# Patient Record
Sex: Male | Born: 1960 | Race: White | Hispanic: No | Smoking: Never smoker
Health system: Southern US, Community
[De-identification: ages and names within clinical notes are randomized; demographics above are authoritative.]

## PROBLEM LIST (undated history)

## (undated) DIAGNOSIS — K219 Gastro-esophageal reflux disease without esophagitis: Secondary | ICD-10-CM

## (undated) DIAGNOSIS — E785 Hyperlipidemia, unspecified: Secondary | ICD-10-CM

## (undated) DIAGNOSIS — T7840XA Allergy, unspecified, initial encounter: Secondary | ICD-10-CM

## (undated) DIAGNOSIS — I4891 Unspecified atrial fibrillation: Secondary | ICD-10-CM

## (undated) DIAGNOSIS — G459 Transient cerebral ischemic attack, unspecified: Secondary | ICD-10-CM

## (undated) DIAGNOSIS — L570 Actinic keratosis: Secondary | ICD-10-CM

## (undated) DIAGNOSIS — K449 Diaphragmatic hernia without obstruction or gangrene: Secondary | ICD-10-CM

## (undated) DIAGNOSIS — I639 Cerebral infarction, unspecified: Secondary | ICD-10-CM

## (undated) DIAGNOSIS — R011 Cardiac murmur, unspecified: Secondary | ICD-10-CM

## (undated) HISTORY — DX: Diaphragmatic hernia without obstruction or gangrene: K44.9

## (undated) HISTORY — PX: TONSILLECTOMY: SUR1361

## (undated) HISTORY — DX: Cardiac murmur, unspecified: R01.1

## (undated) HISTORY — DX: Actinic keratosis: L57.0

## (undated) HISTORY — DX: Cerebral infarction, unspecified: I63.9

## (undated) HISTORY — PX: COLECTOMY: SHX59

## (undated) HISTORY — DX: Unspecified atrial fibrillation: I48.91

## (undated) HISTORY — PX: CHOLECYSTECTOMY: SHX55

## (undated) HISTORY — DX: Hyperlipidemia, unspecified: E78.5

## (undated) HISTORY — DX: Gastro-esophageal reflux disease without esophagitis: K21.9

## (undated) HISTORY — DX: Allergy, unspecified, initial encounter: T78.40XA

---

## 1999-10-04 ENCOUNTER — Encounter (INDEPENDENT_AMBULATORY_CARE_PROVIDER_SITE_OTHER): Payer: Self-pay

## 1999-10-04 ENCOUNTER — Other Ambulatory Visit: Admission: RE | Admit: 1999-10-04 | Discharge: 1999-10-04 | Payer: Self-pay | Admitting: Gastroenterology

## 2000-08-23 ENCOUNTER — Other Ambulatory Visit: Admission: RE | Admit: 2000-08-23 | Discharge: 2000-08-23 | Payer: Self-pay | Admitting: Gastroenterology

## 2000-08-23 ENCOUNTER — Encounter (INDEPENDENT_AMBULATORY_CARE_PROVIDER_SITE_OTHER): Payer: Self-pay | Admitting: Specialist

## 2000-12-01 ENCOUNTER — Encounter: Payer: Self-pay | Admitting: Gastroenterology

## 2000-12-01 ENCOUNTER — Ambulatory Visit (HOSPITAL_COMMUNITY): Admission: RE | Admit: 2000-12-01 | Discharge: 2000-12-01 | Payer: Self-pay | Admitting: Gastroenterology

## 2000-12-06 ENCOUNTER — Encounter: Payer: Self-pay | Admitting: Gastroenterology

## 2000-12-07 ENCOUNTER — Other Ambulatory Visit: Admission: RE | Admit: 2000-12-07 | Discharge: 2000-12-07 | Payer: Self-pay | Admitting: Gastroenterology

## 2000-12-07 ENCOUNTER — Encounter (INDEPENDENT_AMBULATORY_CARE_PROVIDER_SITE_OTHER): Payer: Self-pay | Admitting: Specialist

## 2001-03-28 HISTORY — PX: COLECTOMY: SHX59

## 2001-03-28 HISTORY — PX: TOTAL COLECTOMY: SHX852

## 2001-07-16 ENCOUNTER — Encounter: Payer: Self-pay | Admitting: Gastroenterology

## 2001-07-24 ENCOUNTER — Encounter: Payer: Self-pay | Admitting: Gastroenterology

## 2001-09-07 ENCOUNTER — Encounter: Payer: Self-pay | Admitting: Gastroenterology

## 2001-10-04 ENCOUNTER — Encounter: Payer: Self-pay | Admitting: Gastroenterology

## 2001-10-13 ENCOUNTER — Encounter: Payer: Self-pay | Admitting: Gastroenterology

## 2001-12-11 ENCOUNTER — Encounter: Payer: Self-pay | Admitting: Gastroenterology

## 2002-04-02 ENCOUNTER — Encounter: Payer: Self-pay | Admitting: Gastroenterology

## 2003-10-13 ENCOUNTER — Encounter: Payer: Self-pay | Admitting: Internal Medicine

## 2004-01-29 ENCOUNTER — Ambulatory Visit: Payer: Self-pay | Admitting: Internal Medicine

## 2004-03-11 ENCOUNTER — Encounter: Payer: Self-pay | Admitting: Internal Medicine

## 2004-03-11 ENCOUNTER — Encounter: Payer: Self-pay | Admitting: Gastroenterology

## 2004-04-03 ENCOUNTER — Encounter: Payer: Self-pay | Admitting: Gastroenterology

## 2004-07-29 ENCOUNTER — Ambulatory Visit: Payer: Self-pay | Admitting: Internal Medicine

## 2005-04-02 ENCOUNTER — Ambulatory Visit: Payer: Self-pay | Admitting: Family Medicine

## 2005-08-24 ENCOUNTER — Ambulatory Visit: Payer: Self-pay | Admitting: Internal Medicine

## 2005-10-14 ENCOUNTER — Ambulatory Visit: Payer: Self-pay | Admitting: Internal Medicine

## 2006-01-02 ENCOUNTER — Ambulatory Visit: Payer: Self-pay | Admitting: Internal Medicine

## 2006-01-09 ENCOUNTER — Ambulatory Visit: Payer: Self-pay | Admitting: Internal Medicine

## 2006-01-09 LAB — CONVERTED CEMR LAB
Folate: 9.3 ng/mL
Iron: 122 ug/dL (ref 42–165)
PSA: 0.44 ng/mL (ref 0.10–4.00)
Transferrin: 209.5 mg/dL — ABNORMAL LOW (ref >=212.0)
Vitamin B-12: 379 pg/mL (ref 211–911)

## 2006-02-13 ENCOUNTER — Ambulatory Visit: Payer: Self-pay | Admitting: Gastroenterology

## 2006-02-22 ENCOUNTER — Ambulatory Visit: Payer: Self-pay | Admitting: Internal Medicine

## 2006-04-10 ENCOUNTER — Ambulatory Visit: Payer: Self-pay | Admitting: Internal Medicine

## 2006-04-10 LAB — CONVERTED CEMR LAB
Chol/HDL Ratio, serum: 4.2
Cholesterol: 211 mg/dL (ref 0–200)
HDL: 50.7 mg/dL (ref >39.0)
LDL DIRECT: 129.8 mg/dL
Triglyceride fasting, serum: 74 mg/dL (ref 0–149)
VLDL: 15 mg/dL (ref 0–40)

## 2006-04-17 ENCOUNTER — Ambulatory Visit: Payer: Self-pay | Admitting: Internal Medicine

## 2006-04-17 LAB — CONVERTED CEMR LAB
Basophils Absolute: 0 10*3/uL (ref 0.0–0.1)
Basophils Relative: 0.5 % (ref 0.0–1.0)
Eosinophils Relative: 3.2 % (ref 0.0–5.0)
HCT: 46.7 % (ref 39.0–52.0)
Hemoglobin: 16.6 g/dL (ref 13.0–17.0)
Iron: 154 ug/dL (ref 42–165)
Lymphocytes Relative: 33.8 % (ref 12.0–46.0)
MCHC: 35.6 g/dL (ref 30.0–36.0)
MCV: 89.3 fL (ref 78.0–100.0)
Monocytes Absolute: 0.5 10*3/uL (ref 0.2–0.7)
Monocytes Relative: 8.6 % (ref 3.0–11.0)
Neutro Abs: 3.2 10*3/uL (ref 1.4–7.7)
Neutrophils Relative %: 53.9 % (ref 43.0–77.0)
Platelets: 227 10*3/uL (ref 150–400)
RBC: 5.23 M/uL (ref 4.22–5.81)
RDW: 12 % (ref 11.5–14.6)
WBC: 5.9 10*3/uL (ref 4.5–10.5)

## 2006-08-09 ENCOUNTER — Ambulatory Visit: Payer: Self-pay | Admitting: Internal Medicine

## 2006-09-07 ENCOUNTER — Ambulatory Visit: Payer: Self-pay | Admitting: Internal Medicine

## 2006-10-12 ENCOUNTER — Ambulatory Visit: Payer: Self-pay | Admitting: Internal Medicine

## 2007-01-08 ENCOUNTER — Telehealth (INDEPENDENT_AMBULATORY_CARE_PROVIDER_SITE_OTHER): Payer: Self-pay | Admitting: *Deleted

## 2007-01-15 ENCOUNTER — Ambulatory Visit: Payer: Self-pay | Admitting: Internal Medicine

## 2007-01-15 LAB — CONVERTED CEMR LAB
ALT: 40 units/L (ref 0–53)
AST: 22 units/L (ref 0–37)
Albumin: 3.9 g/dL (ref 3.5–5.2)
Alkaline Phosphatase: 46 units/L (ref 39–117)
BUN: 14 mg/dL (ref 6–23)
Basophils Absolute: 0 10*3/uL (ref 0.0–0.1)
Basophils Relative: 0.7 % (ref 0.0–1.0)
Bilirubin, Direct: 0.1 mg/dL (ref 0.0–0.3)
Blood in Urine, dipstick: NEGATIVE
CO2: 24 meq/L (ref 19–32)
Calcium: 9.4 mg/dL (ref 8.4–10.5)
Chloride: 111 meq/L (ref 96–112)
Cholesterol: 210 mg/dL (ref 0–200)
Creatinine, Ser: 1.2 mg/dL (ref 0.4–1.5)
Direct LDL: 112.3 mg/dL
Eosinophils Absolute: 0.2 10*3/uL (ref 0.0–0.6)
Eosinophils Relative: 3.1 % (ref 0.0–5.0)
GFR calc Af Amer: 83 mL/min
GFR calc non Af Amer: 69 mL/min
Glucose, Bld: 91 mg/dL (ref 70–99)
Glucose, Urine, Semiquant: NEGATIVE
HCT: 46.8 % (ref 39.0–52.0)
HDL: 41.5 mg/dL (ref >39.0)
Hemoglobin: 16.5 g/dL (ref 13.0–17.0)
Ketones, urine, test strip: NEGATIVE
Lymphocytes Relative: 28.9 % (ref 12.0–46.0)
MCHC: 35.2 g/dL (ref 30.0–36.0)
MCV: 91.9 fL (ref 78.0–100.0)
Monocytes Absolute: 0.5 10*3/uL (ref 0.2–0.7)
Monocytes Relative: 7.7 % (ref 3.0–11.0)
Neutro Abs: 4.3 10*3/uL (ref 1.4–7.7)
Neutrophils Relative %: 59.6 % (ref 43.0–77.0)
Nitrite: NEGATIVE
Platelets: 224 10*3/uL (ref 150–400)
Potassium: 4.6 meq/L (ref 3.5–5.1)
Protein, U semiquant: NEGATIVE
RBC: 5.09 M/uL (ref 4.22–5.81)
RDW: 11.8 % (ref 11.5–14.6)
Sodium: 146 meq/L — ABNORMAL HIGH (ref 135–145)
Specific Gravity, Urine: >=1.03
TSH: 1.8 microintl units/mL (ref 0.35–5.50)
Total Bilirubin: 0.6 mg/dL (ref 0.3–1.2)
Total CHOL/HDL Ratio: 5.1
Total Protein: 6.6 g/dL (ref 6.0–8.3)
Triglycerides: 336 mg/dL (ref 0–149)
Urobilinogen, UA: 0.2
VLDL: 67 mg/dL — ABNORMAL HIGH (ref 0–40)
WBC Urine, dipstick: NEGATIVE
WBC: 7 10*3/uL (ref 4.5–10.5)
pH: 5

## 2007-01-22 ENCOUNTER — Ambulatory Visit: Payer: Self-pay | Admitting: Internal Medicine

## 2007-01-22 DIAGNOSIS — J45909 Unspecified asthma, uncomplicated: Secondary | ICD-10-CM | POA: Insufficient documentation

## 2007-01-22 DIAGNOSIS — E785 Hyperlipidemia, unspecified: Secondary | ICD-10-CM | POA: Insufficient documentation

## 2007-01-22 DIAGNOSIS — K219 Gastro-esophageal reflux disease without esophagitis: Secondary | ICD-10-CM | POA: Insufficient documentation

## 2007-01-22 DIAGNOSIS — Z8719 Personal history of other diseases of the digestive system: Secondary | ICD-10-CM | POA: Insufficient documentation

## 2007-01-22 DIAGNOSIS — K5080 Crohn's disease of both small and large intestine without complications: Secondary | ICD-10-CM | POA: Insufficient documentation

## 2007-03-02 ENCOUNTER — Ambulatory Visit: Payer: Self-pay | Admitting: *Deleted

## 2007-03-07 ENCOUNTER — Ambulatory Visit: Payer: Self-pay | Admitting: *Deleted

## 2007-03-14 ENCOUNTER — Ambulatory Visit: Payer: Self-pay | Admitting: *Deleted

## 2007-03-28 ENCOUNTER — Ambulatory Visit: Payer: Self-pay | Admitting: *Deleted

## 2007-04-04 ENCOUNTER — Ambulatory Visit: Payer: Self-pay | Admitting: *Deleted

## 2007-04-13 ENCOUNTER — Ambulatory Visit: Payer: Self-pay | Admitting: *Deleted

## 2007-05-04 ENCOUNTER — Ambulatory Visit: Payer: Self-pay | Admitting: *Deleted

## 2007-05-09 ENCOUNTER — Ambulatory Visit: Payer: Self-pay | Admitting: *Deleted

## 2007-05-18 ENCOUNTER — Ambulatory Visit: Payer: Self-pay | Admitting: Internal Medicine

## 2007-05-18 ENCOUNTER — Ambulatory Visit: Payer: Self-pay | Admitting: *Deleted

## 2007-05-18 LAB — CONVERTED CEMR LAB
ALT: 34 units/L (ref 0–53)
AST: 23 units/L (ref 0–37)
Albumin: 3.7 g/dL (ref 3.5–5.2)
Alkaline Phosphatase: 42 units/L (ref 39–117)
Bilirubin, Direct: 0.3 mg/dL (ref 0.0–0.3)
Cholesterol: 161 mg/dL (ref 0–200)
HDL: 51.6 mg/dL (ref >39.0)
LDL Cholesterol: 97 mg/dL (ref 0–99)
Total Bilirubin: 1.3 mg/dL — ABNORMAL HIGH (ref 0.3–1.2)
Total CHOL/HDL Ratio: 3.1
Total Protein: 6.3 g/dL (ref 6.0–8.3)
Triglycerides: 64 mg/dL (ref 0–149)
VLDL: 13 mg/dL (ref 0–40)

## 2007-05-23 ENCOUNTER — Ambulatory Visit: Payer: Self-pay | Admitting: *Deleted

## 2007-05-25 ENCOUNTER — Ambulatory Visit: Payer: Self-pay | Admitting: Internal Medicine

## 2007-05-30 ENCOUNTER — Ambulatory Visit: Payer: Self-pay | Admitting: *Deleted

## 2007-06-06 ENCOUNTER — Ambulatory Visit: Payer: Self-pay | Admitting: *Deleted

## 2007-06-13 ENCOUNTER — Ambulatory Visit: Payer: Self-pay | Admitting: *Deleted

## 2007-06-22 ENCOUNTER — Ambulatory Visit: Payer: Self-pay | Admitting: *Deleted

## 2007-07-20 ENCOUNTER — Ambulatory Visit: Payer: Self-pay | Admitting: *Deleted

## 2007-07-27 ENCOUNTER — Ambulatory Visit: Payer: Self-pay | Admitting: *Deleted

## 2007-08-10 ENCOUNTER — Ambulatory Visit: Payer: Self-pay | Admitting: *Deleted

## 2007-08-24 ENCOUNTER — Ambulatory Visit: Payer: Self-pay | Admitting: *Deleted

## 2007-09-19 ENCOUNTER — Ambulatory Visit: Payer: Self-pay | Admitting: *Deleted

## 2007-10-04 ENCOUNTER — Telehealth: Payer: Self-pay | Admitting: *Deleted

## 2007-10-04 ENCOUNTER — Ambulatory Visit: Payer: Self-pay | Admitting: Internal Medicine

## 2007-10-04 DIAGNOSIS — L2089 Other atopic dermatitis: Secondary | ICD-10-CM | POA: Insufficient documentation

## 2007-10-18 ENCOUNTER — Telehealth: Payer: Self-pay | Admitting: Internal Medicine

## 2007-10-18 ENCOUNTER — Telehealth: Payer: Self-pay | Admitting: Gastroenterology

## 2007-10-19 ENCOUNTER — Ambulatory Visit: Payer: Self-pay | Admitting: Internal Medicine

## 2007-10-22 ENCOUNTER — Telehealth: Payer: Self-pay | Admitting: Internal Medicine

## 2007-10-31 ENCOUNTER — Telehealth: Payer: Self-pay | Admitting: Internal Medicine

## 2007-11-12 ENCOUNTER — Ambulatory Visit: Payer: Self-pay | Admitting: Gastroenterology

## 2007-11-12 DIAGNOSIS — R1013 Epigastric pain: Secondary | ICD-10-CM | POA: Insufficient documentation

## 2008-01-26 ENCOUNTER — Telehealth: Payer: Self-pay | Admitting: Family Medicine

## 2008-02-06 ENCOUNTER — Ambulatory Visit: Payer: Self-pay | Admitting: Internal Medicine

## 2008-02-06 LAB — CONVERTED CEMR LAB
ALT: 61 units/L — ABNORMAL HIGH (ref 0–53)
Alkaline Phosphatase: 48 units/L (ref 39–117)
Bilirubin, Direct: 0.1 mg/dL (ref 0.0–0.3)
Blood in Urine, dipstick: NEGATIVE
CO2: 29 meq/L (ref 19–32)
GFR calc Af Amer: 92 mL/min
Glucose, Bld: 89 mg/dL (ref 70–99)
HDL: 48.5 mg/dL (ref >39.0)
Ketones, urine, test strip: NEGATIVE
LDL Cholesterol: 128 mg/dL — ABNORMAL HIGH (ref 0–99)
Lymphocytes Relative: 36.9 % (ref 12.0–46.0)
Monocytes Relative: 8 % (ref 3.0–12.0)
Nitrite: NEGATIVE
Platelets: 256 10*3/uL (ref 150–400)
Potassium: 4 meq/L (ref 3.5–5.1)
RDW: 11.5 % (ref 11.5–14.6)
Sodium: 142 meq/L (ref 135–145)
Total Bilirubin: 1.3 mg/dL — ABNORMAL HIGH (ref 0.3–1.2)
Total CHOL/HDL Ratio: 3.9
Total Protein: 7.3 g/dL (ref 6.0–8.3)
Triglycerides: 70 mg/dL (ref 0–149)
Urobilinogen, UA: 0.2
VLDL: 14 mg/dL (ref 0–40)

## 2008-02-13 ENCOUNTER — Ambulatory Visit: Payer: Self-pay | Admitting: Internal Medicine

## 2008-02-26 ENCOUNTER — Telehealth: Payer: Self-pay | Admitting: Internal Medicine

## 2008-08-27 ENCOUNTER — Ambulatory Visit: Payer: Self-pay | Admitting: Internal Medicine

## 2008-08-27 LAB — CONVERTED CEMR LAB
Albumin: 3.8 g/dL (ref 3.5–5.2)
Alkaline Phosphatase: 42 units/L (ref 39–117)
LDL Cholesterol: 120 mg/dL — ABNORMAL HIGH (ref 0–99)
Total CHOL/HDL Ratio: 3
Triglycerides: 48 mg/dL (ref 0.0–149.0)
VLDL: 9.6 mg/dL (ref 0.0–40.0)

## 2008-09-04 ENCOUNTER — Ambulatory Visit: Payer: Self-pay | Admitting: Internal Medicine

## 2008-09-04 LAB — CONVERTED CEMR LAB
HDL goal, serum: 40 mg/dL
LDL Goal: 160 mg/dL

## 2008-09-16 ENCOUNTER — Telehealth: Payer: Self-pay | Admitting: *Deleted

## 2009-03-02 ENCOUNTER — Ambulatory Visit: Payer: Self-pay | Admitting: Internal Medicine

## 2009-03-02 LAB — CONVERTED CEMR LAB
ALT: 37 units/L (ref 0–53)
AST: 26 units/L (ref 0–37)
Albumin: 3.9 g/dL (ref 3.5–5.2)
BUN: 12 mg/dL (ref 6–23)
Basophils Absolute: 0 10*3/uL (ref 0.0–0.1)
CO2: 28 meq/L (ref 19–32)
Chloride: 107 meq/L (ref 96–112)
Glucose, Bld: 102 mg/dL — ABNORMAL HIGH (ref 70–99)
Glucose, Urine, Semiquant: NEGATIVE
HCT: 50.5 % (ref 39.0–52.0)
HDL: 51.9 mg/dL (ref >39.00)
Hemoglobin: 17.3 g/dL — ABNORMAL HIGH (ref 13.0–17.0)
Lymphs Abs: 1.9 10*3/uL (ref 0.7–4.0)
MCV: 94.1 fL (ref 78.0–100.0)
Monocytes Absolute: 0.4 10*3/uL (ref 0.1–1.0)
Monocytes Relative: 7.9 % (ref 3.0–12.0)
Neutro Abs: 3 10*3/uL (ref 1.4–7.7)
Nitrite: NEGATIVE
PSA: 0.42 ng/mL (ref 0.10–4.00)
Platelets: 165 10*3/uL (ref 150.0–400.0)
Potassium: 4.5 meq/L (ref 3.5–5.1)
RDW: 12.2 % (ref 11.5–14.6)
Sodium: 142 meq/L (ref 135–145)
Specific Gravity, Urine: 1.025
TSH: 1.12 microintl units/mL (ref 0.35–5.50)
Total Bilirubin: 1.3 mg/dL — ABNORMAL HIGH (ref 0.3–1.2)
WBC Urine, dipstick: NEGATIVE
pH: 5

## 2009-03-09 ENCOUNTER — Ambulatory Visit: Payer: Self-pay | Admitting: Internal Medicine

## 2009-04-28 ENCOUNTER — Telehealth: Payer: Self-pay | Admitting: Internal Medicine

## 2009-05-19 ENCOUNTER — Ambulatory Visit: Payer: Self-pay | Admitting: Internal Medicine

## 2009-05-19 DIAGNOSIS — R002 Palpitations: Secondary | ICD-10-CM | POA: Insufficient documentation

## 2009-05-19 DIAGNOSIS — R55 Syncope and collapse: Secondary | ICD-10-CM | POA: Insufficient documentation

## 2009-06-03 ENCOUNTER — Encounter: Payer: Self-pay | Admitting: Internal Medicine

## 2009-06-03 ENCOUNTER — Ambulatory Visit: Payer: Self-pay | Admitting: Internal Medicine

## 2009-06-03 ENCOUNTER — Ambulatory Visit (HOSPITAL_COMMUNITY): Admission: RE | Admit: 2009-06-03 | Discharge: 2009-06-03 | Payer: Self-pay | Admitting: Internal Medicine

## 2009-06-03 ENCOUNTER — Ambulatory Visit: Payer: Self-pay

## 2009-06-10 ENCOUNTER — Ambulatory Visit: Payer: Self-pay | Admitting: Internal Medicine

## 2009-06-10 DIAGNOSIS — J45991 Cough variant asthma: Secondary | ICD-10-CM | POA: Insufficient documentation

## 2009-06-10 DIAGNOSIS — R05 Cough: Secondary | ICD-10-CM | POA: Insufficient documentation

## 2009-06-10 DIAGNOSIS — R059 Cough, unspecified: Secondary | ICD-10-CM | POA: Insufficient documentation

## 2009-06-19 ENCOUNTER — Telehealth: Payer: Self-pay | Admitting: Internal Medicine

## 2009-06-24 ENCOUNTER — Encounter: Payer: Self-pay | Admitting: Internal Medicine

## 2009-07-02 ENCOUNTER — Telehealth: Payer: Self-pay | Admitting: Internal Medicine

## 2009-07-14 ENCOUNTER — Telehealth: Payer: Self-pay | Admitting: Internal Medicine

## 2009-07-14 ENCOUNTER — Ambulatory Visit: Payer: Self-pay | Admitting: Internal Medicine

## 2009-08-06 ENCOUNTER — Telehealth: Payer: Self-pay | Admitting: Internal Medicine

## 2009-08-06 ENCOUNTER — Ambulatory Visit: Payer: Self-pay | Admitting: Internal Medicine

## 2009-09-07 ENCOUNTER — Ambulatory Visit: Payer: Self-pay | Admitting: Internal Medicine

## 2009-09-07 DIAGNOSIS — L259 Unspecified contact dermatitis, unspecified cause: Secondary | ICD-10-CM | POA: Insufficient documentation

## 2009-12-22 ENCOUNTER — Encounter: Payer: Self-pay | Admitting: Internal Medicine

## 2010-04-18 ENCOUNTER — Encounter: Payer: Self-pay | Admitting: Internal Medicine

## 2010-04-25 LAB — CONVERTED CEMR LAB
AST: 20 units/L (ref 0–37)
Albumin: 3.9 g/dL (ref 3.5–5.2)
BUN: 12 mg/dL (ref 6–23)
CK-MB: 1.3 ng/mL (ref 0.3–4.0)
Calcium: 9.4 mg/dL (ref 8.4–10.5)
GFR calc non Af Amer: 75.18 mL/min (ref >60)
Glucose, Bld: 93 mg/dL (ref 70–99)
Potassium: 3.9 meq/L (ref 3.5–5.1)
Relative Index: 1.4 (ref 0.0–2.5)
Sodium: 141 meq/L (ref 135–145)
TSH: 0.52 microintl units/mL (ref 0.35–5.50)
Total Bilirubin: 0.6 mg/dL (ref 0.3–1.2)

## 2010-04-27 NOTE — Progress Notes (Signed)
Summary: requesting other abx  Phone Note Call from Patient Call back at Home Phone [phone]   Caller: Patient---live call Complaint: Cough/Sore throat Summary of Call: Still coughing. Would like to try the other abx, that was discussed at his last visit. His pharmacy is [facility] on [address]. Initial call taken by: Despina Arias,  June 19, 2009 10:50 AM  Follow-up for Phone Call        per dr Larrie Kass have doxycycoline 100 two times a day for 10 days and medrol 4 mg dose pack Follow-up by: Allyne Gee, LPN,  June 19, 9469 10:59 AM  Additional Follow-up for Phone Call Additional follow up Details #1::        Phone Call Completed Additional Follow-up by: Shelbie Hutching, RN,  June 19, 2009 11:52 AM    New/Updated Medications: DOXYCYCLINE HYCLATE 100 MG CAPS (DOXYCYCLINE HYCLATE) One two times a day for 10 days. MEDROL (PAK) 4 MG TABS (METHYLPREDNISOLONE) As directed. Prescriptions: MEDROL (PAK) 4 MG TABS (METHYLPREDNISOLONE) As directed.  #1 x 0   Entered by:   Shelbie Hutching, RN   Authorized by:   Ricard Dillon MD   Signed by:   Shelbie Hutching, RN on 06/19/2009   Method used:   Electronically to        [facility]. [phone]* (retail)       [place], [place]  [address]       Ph: [phone]       Fax: [phone]   RxID:   [id number] DOXYCYCLINE HYCLATE 100 MG CAPS (DOXYCYCLINE HYCLATE) One two times a day for 10 days.  #20 x 0   Entered by:   Shelbie Hutching, RN   Authorized by:   Ricard Dillon MD   Signed by:   Shelbie Hutching, RN on 06/19/2009   Method used:   Electronically to        [facility]. [phone]* (retail)       [address] [place], [place]  [address]       Ph: [phone]       Fax: [phone]   RxID:   [id number]

## 2010-04-27 NOTE — Assessment & Plan Note (Signed)
Summary: COUGH, CONGESTION, FEVER // RS   Vital Signs:  Patient profile:   50 year old male Height:      74 inches Weight:      228 pounds BMI:     29.38 Temp:     98.1 degrees F oral Pulse rate:   60 / minute Resp:     14 per minute BP sitting:   130 / 70  (left arm)  Vitals Entered By: Allyne Gee, LPN (June 11, 2583 2:77 AM) CC: c/o cough and chest congestion, URI symptoms   Primary Care Provider:  Benay Pillow, MD  CC:  c/o cough and chest congestion and URI symptoms.  History of Present Illness: two weeks of allergy symptoms followed by fever and congestion and now post infectious cough with congestion  URI Symptoms      This is a 50 year old man who presents with URI symptoms.  The patient reports dry cough.  The patient denies fever, low-grade fever (<100.5 degrees), fever of 100.5-103 degrees, fever of 103.1-104 degrees, fever to >104 degrees, stiff neck, dyspnea, wheezing, rash, vomiting, diarrhea, use of an antipyretic, and response to antipyretic.  The patient also reports itchy watery eyes, itchy throat, and seasonal symptoms.  The patient denies the following risk factors for Strep sinusitis: unilateral facial pain, unilateral nasal discharge, poor response to decongestant, double sickening, tooth pain, Strep exposure, tender adenopathy, and absence of cough.     Preventive Screening-Counseling & Management  Alcohol-Tobacco     Smoking Status: never     Passive Smoke Exposure: no  Problems Prior to Update: 1)  Palpitations, Occasional  (ICD-785.1) 2)  Syncope  (ICD-780.2) 3)  Abdominal Pain-epigastric  (ICD-789.06) 4)  Dermatitis, Atopic  (ICD-691.8) 5)  Family History Breast Cancer 1st Degree Relative <50  (ICD-V16.3) 6)  Family History of Cad Male 1st Degree Relative <50  (ICD-V17.3) 7)  Gastrointestinal Hemorrhage, Hx of  (ICD-V12.79) 8)  Gerd  (ICD-530.81) 9)  Crohn's Disease, Large and Small Intestines  (ICD-555.2) 10)  Asthma  (ICD-493.90) 11)   Hyperlipidemia  (ICD-272.4) 12)  Physical Examination  (ICD-V70.0)  Current Problems (verified): 1)  Palpitations, Occasional  (ICD-785.1) 2)  Syncope  (ICD-780.2) 3)  Abdominal Pain-epigastric  (ICD-789.06) 4)  Dermatitis, Atopic  (ICD-691.8) 5)  Family History Breast Cancer 1st Degree Relative <50  (ICD-V16.3) 6)  Family History of Cad Male 1st Degree Relative <50  (ICD-V17.3) 7)  Gastrointestinal Hemorrhage, Hx of  (ICD-V12.79) 8)  Gerd  (ICD-530.81) 9)  Crohn's Disease, Large and Small Intestines  (ICD-555.2) 10)  Asthma  (ICD-493.90) 11)  Hyperlipidemia  (ICD-272.4) 12)  Physical Examination  (ICD-V70.0)  Medications Prior to Update: 1)  Omeprazole 40 Mg Cpdr (Omeprazole) .... One By Mouth Daily 2)  Metronidazole 250 Mg  Tabs (Metronidazole) .Marland Kitchen.. 1 Once Daily Hold 3)  Zyrtec Allergy 10 Mg  Tabs (Cetirizine Hcl) .... Once Daily 4)  Bayer Low Strength 81 Mg  Tbec (Aspirin) .... Once Daily 5)  Pulmicort Flexhaler 180 Mcg/act  Inha (Budesonide (Inhalation)) .... Once Daily 6)  Krill Oil 1000 Mg Caps (Krill Oil) .... Two By Mouth Two Times A Day 7)  Claritin 10 Mg Tabs (Loratadine) .Marland Kitchen.. 1 Once Daily  Current Medications (verified): 1)  Omeprazole 40 Mg Cpdr (Omeprazole) .... One By Mouth Daily 2)  Metronidazole 250 Mg  Tabs (Metronidazole) .Marland Kitchen.. 1 Once Daily Hold 3)  Zyrtec Allergy 10 Mg  Tabs (Cetirizine Hcl) .... Once Daily 4)  Bayer Low Strength 81  Mg  Tbec (Aspirin) .... Once Daily 5)  Pulmicort Flexhaler 180 Mcg/act  Inha (Budesonide (Inhalation)) .... Once Daily 6)  Krill Oil 1000 Mg Caps (Krill Oil) .... Two By Mouth Two Times A Day 7)  Claritin 10 Mg Tabs (Loratadine) .Marland Kitchen.. 1 Once Daily 8)  Symbicort 160-4.5 Mcg/act Aero (Budesonide-Formoterol Fumarate) .... Two Puff Two Times A Day 9)  Benzonatate 200 Mg Caps (Benzonatate) .... One By Mouth Q 6 Hours 10)  Fexofenadine-Pseudoephedrine 60-120 Mg Xr12h-Tab (Fexofenadine-Pseudoephedrine) .... One By Mouth Two Times A Day 11)   Nasacort Aq 55 Mcg/act Aers (Triamcinolone Acetonide(Nasal)) .... Two Sprays in Nostrils Daily  Allergies (verified): No Known Drug Allergies  Past History:  Family History: Last updated: 11/27/07 father died from stomach CA Family History of CAD Male 1st degree relative <50  father ( smoker) mother Family History Breast cancer 1st degree relative <50 Family History Ovarian cancer- Mother Family History of Bladder cancer- Father  Social History: Last updated: 11/12/2007 Married Never Smoked Alcohol use-yes Drug use-no Regular exercise-no Daily Caffeine Use 3-4 cups coffee/day  Risk Factors: Exercise: no (01/22/2007)  Risk Factors: Smoking Status: never (06/10/2009) Passive Smoke Exposure: no (06/10/2009)  Past medical, surgical, family and social histories (including risk factors) reviewed, and no changes noted (except as noted below).  Past Medical History: Reviewed history from 11/12/2007 and no changes required. Hyperlipidemia Asthma GERD Ulcerative colitis Hiatal Hernia  Past Surgical History: Reviewed history from 11/12/2007 and no changes required. Colectomy with ileoanal anastsamosis 2003 Tonsillectomy Cholecystectomy  Family History: Reviewed history from November 27, 2007 and no changes required. father died from stomach CA Family History of CAD Male 1st degree relative <50  father ( smoker) mother Family History Breast cancer 1st degree relative <50 Family History Ovarian cancer- Mother Family History of Bladder cancer- Father  Social History: Reviewed history from 11/12/2007 and no changes required. Married Never Smoked Alcohol use-yes Drug use-no Regular exercise-no Daily Caffeine Use 3-4 cups coffee/day  Review of Systems  The patient denies anorexia, fever, weight loss, weight gain, vision loss, decreased hearing, hoarseness, chest pain, syncope, dyspnea on exertion, peripheral edema, prolonged cough, headaches, hemoptysis, abdominal pain,  melena, hematochezia, severe indigestion/heartburn, hematuria, incontinence, genital sores, muscle weakness, suspicious skin lesions, transient blindness, difficulty walking, depression, unusual weight change, abnormal bleeding, enlarged lymph nodes, angioedema, and breast masses.    Physical Exam  General:  Well developed, well nourished, no acute distress. Head:  normocephalic and atraumatic.   Eyes:  PERRLA, no icterus. Ears:  External ear exam shows no significant lesions or deformities.  Otoscopic examination reveals clear canals, tympanic membranes are intact bilaterally without bulging, retraction, inflammation or discharge. Hearing is grossly normal bilaterally. Nose:  mucosal erythema and mucosal edema.   Mouth:  posterior lymphoid hypertrophy and postnasal drip.   Neck:  Supple; no masses or thyromegaly. Lungs:  normal respiratory effort, R wheezes, and L wheezes.   Heart:  Regular rate and rhythm; no murmurs, rubs,  or bruits. Abdomen:  Soft, nontender and nondistended. No masses, hepatosplenomegaly or hernias noted. Normal bowel sounds. Msk:  Symmetrical with no gross deformities. Normal posture. Extremities:  No clubbing, cyanosis, edema, or deformity noted with normal full range of motion of all joints.     Impression & Recommendations:  Problem # 1:  COUGH (ICD-786.2) tessilon perles consider added medrol dose pack if symptoms do not stop  Problem # 2:  CROHN'S DISEASE, LARGE AND SMALL INTESTINES (ICD-555.2)  Problem # 3:  COUGH VARIANT ASTHMA (ICD-493.82)  wheezing after  cough His updated medication list for this problem includes:    Pulmicort Flexhaler 180 Mcg/act Inha (Budesonide (inhalation)) ..... Once daily    Symbicort 160-4.5 Mcg/act Aero (Budesonide-formoterol fumarate) .Marland Kitchen..Marland Kitchen Two puff two times a day  Current Asthma Management Plan:  Green Zone:  PULMICORT FLEXHALER 180 MCG/ACT  INHA  Complete Medication List: 1)  Omeprazole 40 Mg Cpdr (Omeprazole) ....  One by mouth daily 2)  Metronidazole 250 Mg Tabs (Metronidazole) .Marland Kitchen.. 1 once daily hold 3)  Zyrtec Allergy 10 Mg Tabs (Cetirizine hcl) .... Once daily 4)  Bayer Low Strength 81 Mg Tbec (Aspirin) .... Once daily 5)  Pulmicort Flexhaler 180 Mcg/act Inha (Budesonide (inhalation)) .... Once daily 6)  Krill Oil 1000 Mg Caps (Krill oil) .... Two by mouth two times a day 7)  Claritin 10 Mg Tabs (Loratadine) .Marland Kitchen.. 1 once daily 8)  Symbicort 160-4.5 Mcg/act Aero (Budesonide-formoterol fumarate) .... Two puff two times a day 9)  Benzonatate 200 Mg Caps (Benzonatate) .... One by mouth q 6 hours 10)  Fexofenadine-pseudoephedrine 60-120 Mg Xr12h-tab (Fexofenadine-pseudoephedrine) .... One by mouth two times a day 11)  Nasacort Aq 55 Mcg/act Aers (Triamcinolone acetonide(nasal)) .... Two sprays in nostrils daily  Asthma Management Plan    Plan based on PEF formula: [place] and [employer]yl He[employer] Zone:PULMICORT FLEXHALER 180 MCG/ACT  INHA  Yellow Zone: SYMBICORT 160-4.5 MCG/ACT AERO:  2 puffs every 12 hours  Red Zone: SYMBICORT 160-4.5 MCG/ACT AERO Start Prednisone Taper.    Patient Instructions: 1)  follow alergy protocol 2)  Please schedule a follow-up appointment in 3 months. Prescriptions: FEXOFENADINE-PSEUDOEPHEDRINE 60-120 MG XR12H-TAB (FEXOFENADINE-PSEUDOEPHEDRINE) one by mouth two times a day  #60 x 3   Entered and Authorized by:   Ricard Dillon MD   Signed by:   Ricard Dillon MD on 06/10/2009   Method used:   Electronically to        [facility]. [phone]* (retail)       [address] [place], [place]  [address]       Ph: [phone]       Fax: [phone]   RxID:   [id number] BENZONATATE 200 MG CAPS (BENZONATATE) one by mouth q 6 hours  #30 x 2   Entered and Authorized by:   Ricard Dillon MD   Signed by:   Ricard Dillon MD on 06/10/2009   Method used:   Electronically to        [facility]. [phone]* (retail)       [address] [place], [place]  [address]       Ph: [phone]       Fax: [phone]   RxID:   [id number]

## 2010-04-27 NOTE — Letter (Signed)
Summary: [facility]   Imported By: Phillis Knack 09/07/2009 11:25:27  _____________________________________________________________________  External Attachment:    Type:   Image     Comment:   External Document

## 2010-04-27 NOTE — Progress Notes (Signed)
Summary: NEW RX [facility]  Phone Note Call from Patient Call back at Home Phone [phone]   Caller: Patient Call For: Ricard Dillon MD Summary of Call: PT NEEDS NEW RXS FOR OMPERAZOLE 40 MG #90 AND GENERIC CLARITIN 10 MG #90 WITH 3 REFILLS FAX TO [facility] Initial call taken by: Glo Herring,  April 28, 2009 10:55 AM    New/Updated Medications: CLARITIN 10 MG TABS (LORATADINE) 1 once daily Prescriptions: CLARITIN 10 MG TABS (LORATADINE) 1 once daily  #90 x 3   Entered by:   Allyne Gee, LPN   Authorized by:   Ricard Dillon MD   Signed by:   Allyne Gee, LPN on 25/75/0518   Method used:   Electronically to        [facility] (mail-order)             ,          Ph: [phone]       Fax: [phone]   RxID:   [id number] OMEPRAZOLE 40 MG CPDR (OMEPRAZOLE) one by mouth daily  #90 x 3   Entered by:   Allyne Gee, LPN   Authorized by:   Ricard Dillon MD   Signed by:   Allyne Gee, LPN on 07/61/5183   Method used:   Electronically to        [facility] (mail-order)             ,          Ph: [phone]       Fax: [phone]   RxID:   [id number] CLARITIN 10 MG TABS (LORATADINE) 1 once daily  #90 x 3   Entered by:   Allyne Gee, LPN   Authorized by:   Ricard Dillon MD   Signed by:   Allyne Gee, LPN on 01/58/6825   Method used:   Electronically to        [facility] (mail-order)             ,          Ph: [phone]       Fax: [phone]   RxID:   [id number]

## 2010-04-27 NOTE — Consult Note (Signed)
Summary: [facility]  [facility]   Imported By: Laural Benes 12/29/2009 10:34:57  _____________________________________________________________________  External Attachment:    Type:   Image     Comment:   External Document

## 2010-04-27 NOTE — Progress Notes (Signed)
Summary: LLQ pain  Phone Note Call from Patient   Caller: Patient Call For: Ricard Dillon MD Summary of Call: Pt started with LLQ pain after a bowel movement 2 hours ago.  Pain is constant, no pain on palpation, no nausea, vomiting or fever.   Initial call taken by: Deanna Artis CMA,  Aug 06, 2009 11:44 AM  Follow-up for Phone Call        per dr Vallarie Fei:clear liquid diet for 24 hours and call us in am and let us know how he is,  if any chills or fever let us know immediately Follow-up by: Allyne Gee, LPN,  Aug 07, 2070 18:28 AM  Additional Follow-up for Phone Call Additional follow up Details #1::        Pt. given Dr. Arnoldo Morale recommendations. Additional Follow-up by: Deanna Artis CMA,  Aug 06, 2009 12:00 PM

## 2010-04-27 NOTE — Progress Notes (Signed)
Summary: Pt still has cough. Wants to know if chest xray needed.  Phone Note Call from Patient Call back at [facility] Phone [phone]   Caller: Patient Reason for Call: Acute Illness Summary of Call: Pt called and said that chest congestion is not clearing up. Pt has dry, unproductive cough, that will not go away. Pt wants to know if Dr. Arnoldo Morale wants to order a chest xray or other meds. Please advise pt.  Initial call taken by: Braulio Bosch,  July 14, 2009 9:32 AM  Follow-up for Phone Call        cxr per dr Arnoldo Morale Follow-up by: Allyne Gee, LPN,  July 14, 9036 9:42 AM     Appended Document: Pt still has cough. Wants to know if chest xray needed. per dr Arnoldo Morale- give 30m medrol dose pack, biaxin 400 two times a day for 14 days and tussionex--pt informed

## 2010-04-27 NOTE — Consult Note (Signed)
Summary: [facility]   Imported By: Laural Benes 02/10/2010 13:26:31  _____________________________________________________________________  External Attachment:    Type:   Image     Comment:   External Document

## 2010-04-27 NOTE — Assessment & Plan Note (Signed)
Summary: 6 MNTH ROV//SLM   Vital Signs:  Patient profile:   50 year old male Height:      74 inches Weight:      224 pounds BMI:     28.86 Temp:     98.2 degrees F oral Pulse rate:   60 / minute Resp:     14 per minute BP sitting:   124 / 80  (left arm)  Vitals Entered By: Allyne Gee, LPN (September 07, 100 7:25 AM)  Nutrition Counseling: Patient's BMI is greater than 25 and therefore counseled on weight management options. CC: roa- has poison ivy on knees   Primary Care Provider:  Benay Pillow, MD  CC:  roa- has poison ivy on knees.  History of Present Illness: poison ivy on knees colitis with mild bloating had a flair in  may with nausea and vomiting also notes back pain ? 6m and sulsalazine  increased stress and strain of job termination  Preventive Screening-Counseling & Management  Alcohol-Tobacco     Smoking Status: never     Passive Smoke Exposure: no  Problems Prior to Update: 1)  Abdominal Pain  (ICD-789.00) 2)  Cough Variant Asthma  (ICD-493.82) 3)  Cough  (ICD-786.2) 4)  Palpitations, Occasional  (ICD-785.1) 5)  Syncope  (ICD-780.2) 6)  Abdominal Pain-epigastric  (ICD-789.06) 7)  Dermatitis, Atopic  (ICD-691.8) 8)  Family History Breast Cancer 1st Degree Relative <50  (ICD-V16.3) 9)  Family History of Cad Male 1st Degree Relative <50  (ICD-V17.3) 10)  Gastrointestinal Hemorrhage, Hx of  (ICD-V12.79) 11)  Gerd  (ICD-530.81) 12)  Crohn's Disease, Large and Small Intestines  (ICD-555.2) 13)  Asthma  (ICD-493.90) 14)  Hyperlipidemia  (ICD-272.4) 15)  Physical Examination  (ICD-V70.0)  Current Problems (verified): 1)  Abdominal Pain  (ICD-789.00) 2)  Cough Variant Asthma  (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) 3)  Cough  (ICD-786.2) 4)  Palpitations, Occasional  (ICD-785.1) 5)  Syncope  (ICD-780.2) 6)  Abdominal Pain-epigastric  (ICD-789.06) 7)  Dermatitis, Atopic  (ICD-691.8) 8)  Family History Breast Cancer 1st Degree Relative <50  (ICD-V16.3) 9)  Family History of  Cad Male 1st Degree Relative <50  (ICD-V17.3) 10)  Gastrointestinal Hemorrhage, Hx of  (ICD-V12.79) 11)  Gerd  (ICD-530.81) 12)  Crohn's Disease, Large and Small Intestines  (ICD-555.2) 13)  Asthma  (ICD-493.90) 14)  Hyperlipidemia  (ICD-272.4) 15)  Physical Examination  (ICD-V70.0)  Medications Prior to Update: 1)  Omeprazole 40 Mg Cpdr (Omeprazole) .... One By Mouth Daily 2)  Bayer Low Strength 81 Mg  Tbec (Aspirin) .... Once Daily 3)  Pulmicort Flexhaler 180 Mcg/act  Inha (Budesonide (Inhalation)) .... Once Daily 4)  Krill Oil 1000 Mg Caps (Krill Oil) .... Two By Mouth Two Times A Day 5)  Symbicort 160-4.5 Mcg/act Aero (Budesonide-Formoterol Fumarate) .... Two Puff Two Times A Day 6)  Benzonatate 200 Mg Caps (Benzonatate) .... One By Mouth Q 6 Hours 7)  Fexofenadine-Pseudoephedrine 60-120 Mg Xr12h-Tab (Fexofenadine-Pseudoephedrine) .... One By Mouth Two Times A Day 8)  Nasacort Aq 55 Mcg/act Aers (Triamcinolone Acetonide(Nasal)) .... Two Sprays in Nostrils Daily 9)  Promethazine Hcl 25 Mg Tabs (Promethazine Hcl) .... One Every 6 Hours As Needed For Nausea  Current Medications (verified): 1)  Omeprazole 40 Mg Cpdr (Omeprazole) .... One By Mouth Daily 2)  Bayer Low Strength 81 Mg  Tbec (Aspirin) .... Once Daily 3)  Pulmicort Flexhaler 180 Mcg/act  Inha (Budesonide (Inhalation)) .... Once Daily 4)  Krill Oil 1000 Mg Caps (Krill Oil) .... Two By Mouth  Two Times A Day 5)  Symbicort 160-4.5 Mcg/act Aero (Budesonide-Formoterol Fumarate) .... Two Puff Two Times A Day 6)  Benzonatate 200 Mg Caps (Benzonatate) .... One By Mouth Q 6 Hours 7)  Fexofenadine-Pseudoephedrine 60-120 Mg Xr12h-Tab (Fexofenadine-Pseudoephedrine) .... One By Mouth Two Times A Day 8)  Nasacort Aq 55 Mcg/act Aers (Triamcinolone Acetonide(Nasal)) .... Two Sprays in Nostrils Daily 9)  Promethazine Hcl 25 Mg Tabs (Promethazine Hcl) .... One Every 6 Hours As Needed For Nausea  Allergies (verified): No Known Drug  Allergies  Past History:  Family History: Last updated: 11-12-07 father died from stomach CA Family History of CAD Male 1st degree relative <50  father ( smoker) mother Family History Breast cancer 1st degree relative <50 Family History Ovarian cancer- Mother Family History of Bladder cancer- Father  Social History: Last updated: 11/12/2007 Married Never Smoked Alcohol use-yes Drug use-no Regular exercise-no Daily Caffeine Use 3-4 cups coffee/day  Risk Factors: Exercise: no (01/22/2007)  Risk Factors: Smoking Status: never (09/07/2009) Passive Smoke Exposure: no (09/07/2009)  Past medical, surgical, family and social histories (including risk factors) reviewed, and no changes noted (except as noted below).  Past Medical History: Reviewed history from 11/12/2007 and no changes required. Hyperlipidemia Asthma GERD Ulcerative colitis Hiatal Hernia  Past Surgical History: Reviewed history from 11/12/2007 and no changes required. Colectomy with ileoanal anastsamosis 2003 Tonsillectomy Cholecystectomy  Family History: Reviewed history from 12-Nov-2007 and no changes required. father died from stomach CA Family History of CAD Male 1st degree relative <50  father ( smoker) mother Family History Breast cancer 1st degree relative <50 Family History Ovarian cancer- Mother Family History of Bladder cancer- Father  Social History: Reviewed history from 11/12/2007 and no changes required. Married Never Smoked Alcohol use-yes Drug use-no Regular exercise-no Daily Caffeine Use 3-4 cups coffee/day  Review of Systems       The patient complains of abdominal pain.  The patient denies anorexia, fever, weight loss, weight gain, vision loss, decreased hearing, hoarseness, chest pain, syncope, dyspnea on exertion, peripheral edema, prolonged cough, headaches, hemoptysis, melena, hematochezia, severe indigestion/heartburn, hematuria, incontinence, genital sores, muscle  weakness, suspicious skin lesions, transient blindness, difficulty walking, depression, unusual weight change, abnormal bleeding, enlarged lymph nodes, angioedema, breast masses, and testicular masses.         distension  Physical Exam  General:  Well-developed,well-nourished,in no acute distress; alert,appropriate and cooperative throughout examination Head:  Normocephalic and atraumatic without obvious abnormalities. No apparent alopecia or balding. Eyes:  PERRLA, no icterus. Ears:  R ear normal and L ear normal.   Nose:  mucosal erythema and mucosal edema.   Mouth:  good dentition and pharynx pink and moist.   Neck:  supple.   Lungs:  normal respiratory effort.   Heart:  normal rate, regular rhythm, and no murmur.     Impression & Recommendations:  Problem # 1:  ABDOMINAL PAIN (ICD-789.00)  Discussed symptom control with the patient.   Problem # 2:  CROHN'S DISEASE, LARGE AND SMALL INTESTINES (ICD-555.2) had colectomy increased symptoms send note the Dr Fuller Plan for review  Problem # 3:  HYPERLIPIDEMIA (ICD-272.4)  Labs Reviewed: SGOT: 20 (05/19/2009)   SGPT: 30 (05/19/2009)  Lipid Goals: Chol Goal: 200 (09/04/2008)   HDL Goal: 40 (09/04/2008)   LDL Goal: 160 (09/04/2008)   TG Goal: 150 (09/04/2008)  Prior 10 Yr Risk Heart Disease: 6 % (05/19/2009)   HDL:51.90 (03/02/2009), 62.80 (08/27/2008)  LDL:120 (08/27/2008), 128 (02/06/2008)  Chol:203 (03/02/2009), 192 (08/27/2008)  Trig:124.0 (03/02/2009), 48.0 (08/27/2008)  Problem #  4:  CONTACT DERMATITIS&OTHER ECZEMA DUE UNSPEC CAUSE (ICD-692.9)  His updated medication list for this problem includes:    Promethazine Hcl 25 Mg Tabs (Promethazine hcl) ..... One every 6 hours as needed for nausea    Prednisone 5 Mg Tabs (Prednisone) .Marland KitchenMarland KitchenMarland KitchenMarland Kitchen 4 for 4 days, then 3 for 4 days and then 2 for 4 day and then 1 for 4 days  Discussed avoidance of triggers and symptomatic treatment.   Complete Medication List: 1)  Omeprazole 40 Mg Cpdr  (Omeprazole) .... One by mouth daily 2)  Bayer Low Strength 81 Mg Tbec (Aspirin) .... Once daily 3)  Pulmicort Flexhaler 180 Mcg/act Inha (Budesonide (inhalation)) .... Once daily 4)  Krill Oil 1000 Mg Caps (Krill oil) .... Two by mouth two times a day 5)  Symbicort 160-4.5 Mcg/act Aero (Budesonide-formoterol fumarate) .... Two puff two times a day 6)  Benzonatate 200 Mg Caps (Benzonatate) .... One by mouth q 6 hours 7)  Fexofenadine-pseudoephedrine 60-120 Mg Xr12h-tab (Fexofenadine-pseudoephedrine) .... One by mouth two times a day 8)  Nasacort Aq 55 Mcg/act Aers (Triamcinolone acetonide(nasal)) .... Two sprays in nostrils daily 9)  Promethazine Hcl 25 Mg Tabs (Promethazine hcl) .... One every 6 hours as needed for nausea 10)  Prednisone 5 Mg Tabs (Prednisone) .... 4 for 4 days, then 3 for 4 days and then 2 for 4 day and then 1 for 4 days  Patient Instructions: 1)  Dec CPX Prescriptions: PREDNISONE 5 MG TABS (PREDNISONE) 4 for 4 days, then 3 for 4 days and then 2 for 4 day and then 1 for 4 days  #40 x 0   Entered and Authorized by:   Ricard Dillon MD   Signed by:   Ricard Dillon MD on 09/07/2009   Method used:   Electronically to        [facility] [phone]* (retail)       [address] [place], [place]  [address]       Ph: [phone]       Fax: [phone]   RxID:   [id number]   Appended Document: Orders Update     Clinical Lists Changes  Orders: Added new Referral order of Gastroenterology Referral (GI) - Signed

## 2010-04-27 NOTE — Assessment & Plan Note (Signed)
Summary: irreg pulse/near syncope./ put in procedure room and do Ekg/p...   Vital Signs:  Patient profile:   50 year old male Height:      74 inches Weight:      228 pounds BMI:     29.38 Temp:     98.2 degrees F oral Pulse rate:   56 / minute Resp:     14 per minute BP sitting:   120 / 82  (left arm)  Vitals Entered By: Allyne Gee, LPN (May 20, 7207 11:53 AM) CC: at work and became dizzy with strange feelin in chest-happened several t imes while in a meeting, Lipid Management   Primary Care Provider:  Benay Pillow, MD  CC:  at work and became dizzy with strange feelin in chest-happened several t imes while in a meeting and Lipid Management.  History of Present Illness: single episode of presyncopy with a sensation of rapid heart beat had coffee cerial and mild in the AM felt like he was going to pass out but retained consiousness pulse felt eratic and rapid no hx of AF father has an MI in 34' risks borderline lipid change to fish/kril oil las OV good blood pressure never smoked  Lipid Management History:      Positive NCEP/ATP III risk factors include male age 75 years old or older.  Negative NCEP/ATP III risk factors include no family history for ischemic heart disease and non-tobacco-user status.     Preventive Screening-Counseling & Management  Alcohol-Tobacco     Smoking Status: never     Passive Smoke Exposure: no  Problems Prior to Update: 1)  Palpitations, Occasional  (ICD-785.1) 2)  Syncope  (ICD-780.2) 3)  Abdominal Pain-epigastric  (ICD-789.06) 4)  Dermatitis, Atopic  (ICD-691.8) 5)  Family History Breast Cancer 1st Degree Relative <50  (ICD-V16.3) 6)  Family History of Cad Male 1st Degree Relative <50  (ICD-V17.3) 7)  Gastrointestinal Hemorrhage, Hx of  (ICD-V12.79) 8)  Gerd  (ICD-530.81) 9)  Crohn's Disease, Large and Small Intestines  (ICD-555.2) 10)  Asthma  (ICD-493.90) 11)  Hyperlipidemia  (ICD-272.4) 12)  Physical Examination   (ICD-V70.0)  Medications Prior to Update: 1)  Omeprazole 40 Mg Cpdr (Omeprazole) .... One By Mouth Daily 2)  Metronidazole 250 Mg  Tabs (Metronidazole) .Marland Kitchen.. 1 Once Daily Hold 3)  Zyrtec Allergy 10 Mg  Tabs (Cetirizine Hcl) .... Once Daily 4)  Bayer Low Strength 81 Mg  Tbec (Aspirin) .... Once Daily 5)  Pulmicort Flexhaler 180 Mcg/act  Inha (Budesonide (Inhalation)) .... Once Daily 6)  Krill Oil 1000 Mg Caps (Krill Oil) .... Two By Mouth Two Times A Day 7)  Claritin 10 Mg Tabs (Loratadine) .Marland Kitchen.. 1 Once Daily  Current Medications (verified): 1)  Omeprazole 40 Mg Cpdr (Omeprazole) .... One By Mouth Daily 2)  Metronidazole 250 Mg  Tabs (Metronidazole) .Marland Kitchen.. 1 Once Daily Hold 3)  Zyrtec Allergy 10 Mg  Tabs (Cetirizine Hcl) .... Once Daily 4)  Bayer Low Strength 81 Mg  Tbec (Aspirin) .... Once Daily 5)  Pulmicort Flexhaler 180 Mcg/act  Inha (Budesonide (Inhalation)) .... Once Daily 6)  Krill Oil 1000 Mg Caps (Krill Oil) .... Two By Mouth Two Times A Day 7)  Claritin 10 Mg Tabs (Loratadine) .Marland Kitchen.. 1 Once Daily  Allergies (verified): No Known Drug Allergies  Past History:  Family History: Last updated: 12-08-07 father died from stomach CA Family History of CAD Male 1st degree relative <50  father ( smoker) mother Family History Breast cancer 1st degree  relative <50 Family History Ovarian cancer- Mother Family History of Bladder cancer- Father  Social History: Last updated: 11/12/2007 Married Never Smoked Alcohol use-yes Drug use-no Regular exercise-no Daily Caffeine Use 3-4 cups coffee/day  Risk Factors: Exercise: no (01/22/2007)  Risk Factors: Smoking Status: never (05/19/2009) Passive Smoke Exposure: no (05/19/2009)  Past medical, surgical, family and social histories (including risk factors) reviewed, and no changes noted (except as noted below).  Past Medical History: Reviewed history from 11/12/2007 and no changes required. Hyperlipidemia Asthma GERD Ulcerative  colitis Hiatal Hernia  Past Surgical History: Reviewed history from 11/12/2007 and no changes required. Colectomy with ileoanal anastsamosis 2003 Tonsillectomy Cholecystectomy  Family History: Reviewed history from 11/08/2007 and no changes required. father died from stomach CA Family History of CAD Male 1st degree relative <50  father ( smoker) mother Family History Breast cancer 1st degree relative <50 Family History Ovarian cancer- Mother Family History of Bladder cancer- Father  Social History: Reviewed history from 11/12/2007 and no changes required. Married Never Smoked Alcohol use-yes Drug use-no Regular exercise-no Daily Caffeine Use 3-4 cups coffee/day  Review of Systems  The patient denies anorexia, fever, weight loss, weight gain, vision loss, decreased hearing, hoarseness, chest pain, syncope, dyspnea on exertion, peripheral edema, prolonged cough, headaches, hemoptysis, abdominal pain, melena, hematochezia, severe indigestion/heartburn, hematuria, incontinence, genital sores, muscle weakness, suspicious skin lesions, transient blindness, difficulty walking, depression, unusual weight change, abnormal bleeding, enlarged lymph nodes, angioedema, breast masses, and testicular masses.    Physical Exam  General:  Well developed, well nourished, no acute distress. Head:  Normocephalic and atraumatic. Eyes:  PERRLA, no icterus. Ears:  External ear exam shows no significant lesions or deformities.  Otoscopic examination reveals clear canals, tympanic membranes are intact bilaterally without bulging, retraction, inflammation or discharge. Hearing is grossly normal bilaterally. Mouth:  No deformity or lesions, dentition normal. Neck:  Supple; no masses or thyromegaly. Lungs:  Clear throughout to auscultation. Heart:  Regular rate and rhythm; no murmurs, rubs,  or bruits. Abdomen:  Soft, nontender and nondistended. No masses, hepatosplenomegaly or hernias noted. Normal  bowel sounds. Extremities:  No clubbing, cyanosis, edema, or deformity noted with normal full range of motion of all joints.   Neurologic:  alert & oriented X3.     Impression & Recommendations:  Problem # 1:  SYNCOPE (ICD-780.2) Assessment New  possible presyncope from arythmia less likely ischemic or hypoglycemic  Orders: TLB-BMP (Basic Metabolic Panel-BMET) ([id number]) TLB-Hepatic/Liver Function Pnl (80076-HEPATIC) TLB-TSH (Thyroid Stimulating Hormone) (84443-TSH) TLB-Cardiac Panel ([id number]) Echo Referral (Echo) Venipuncture (85631) EKG w/ Interpretation (93000)  Problem # 2:  PALPITATIONS, OCCASIONAL (ICD-785.1) Assessment: New  will consider event moniter will obtain echo moniter labs Avoid caffeine, chocolate, and stimulants. Stress reduction as well as medication options discussed.   Orders: TLB-BMP (Basic Metabolic Panel-BMET) ([id number]) TLB-Hepatic/Liver Function Pnl (80076-HEPATIC) TLB-TSH (Thyroid Stimulating Hormone) (84443-TSH) TLB-Cardiac Panel ([id number]) Echo Referral (Echo) Venipuncture (72094)  Complete Medication List: 1)  Omeprazole 40 Mg Cpdr (Omeprazole) .... One by mouth daily 2)  Metronidazole 250 Mg Tabs (Metronidazole) .Marland Kitchen.. 1 once daily hold 3)  Zyrtec Allergy 10 Mg Tabs (Cetirizine hcl) .... Once daily 4)  Bayer Low Strength 81 Mg Tbec (Aspirin) .... Once daily 5)  Pulmicort Flexhaler 180 Mcg/act Inha (Budesonide (inhalation)) .... Once daily 6)  Krill Oil 1000 Mg Caps (Krill oil) .... Two by mouth two times a day 7)  Claritin 10 Mg Tabs (Loratadine) .Marland Kitchen.. 1 once daily  Lipid Assessment/Plan:      Based on NCEP/ATP III,  the patient's risk factor category is "2 or more risk factors and a calculated 10 year CAD risk of < 20%".  The patient's lipid goals are as follows: Total cholesterol goal is 200; LDL cholesterol goal is 160; HDL cholesterol goal is 40; Triglyceride goal is 150.  His LDL cholesterol goal has not been  met.  Secondary causes for hyperlipidemia have been ruled out.  He has been counseled on adjunctive measures for lowering his cholesterol and has been provided with dietary instructions.    Patient Instructions: 1)  call is recurs to set up either stress test ( onset witn erertion) or event moniter onset at rest

## 2010-04-27 NOTE — Progress Notes (Signed)
Summary: REQ FOR MED/RX?  Phone Note Call from Patient   Caller: Patient  [phone] Reason for Call: Talk to Nurse, Talk to Doctor Summary of Call: Pt called in to speak with Bonnye, LPN ref to c/o ongoing dry cough, some nasal drainage - pt denies congestion or fever...  Pt was in on 06/10/2009 to see Dr Arnoldo Morale for cough, congestion.... ABX was sent to pharmacy for pt on 06/19/2009.... Pt didn't want to come in for an appt with another physician (Dr Arnoldo Morale out of office) but would like to obtain another refill for med: BENZONATATE 200 MG  or something else to help with continuous dry cough... Pt adv that Rx can be sent to [facility] on [address].  Pt can be reached at [phone] with any questions or concerns. Initial call taken by: Duanne Moron,  July 02, 2009 10:50 AM  Follow-up for Phone Call        already has 2 refills on benzonate and will refill- just concerned that aafter antibioitc and pred pack still cough- dr Arnoldo Morale out of office.offered ov with another md several times, but refused.will call back next week if not better Follow-up by: Allyne Gee, LPN,  July 02, 5804 10:57 AM

## 2010-04-27 NOTE — Assessment & Plan Note (Signed)
Summary: LLQ pain, chills and vomiting/dm   Vital Signs:  Patient profile:   50 year old male Weight:      227 pounds Temp:     97.5 degrees F oral BP sitting:   108 / 62  (left arm) Cuff size:   regular  Vitals Entered By: Cay Schillings LPN (Aug 07, 7679 1:57 PM) CC: c/o llq pain moving to groin area , nausea and pain since this AM - pain past BM this AM ??? Is Patient Diabetic? No   Primary Care Provider:  Benay Pillow, MD  CC:  c/o llq pain moving to groin area  and nausea and pain since this AM - pain past BM this AM ???.  History of Present Illness: 50 year old patient who is seen today for evaluation of left lower quadrant pain.  He has a history Universal colitis and is status post total colectomy with ileoanal anastomosis.  He awoke this morning feeling well, but at approximately 930, had the onset of left lower quadrant pain.  He is said to associated episodes of nausea and vomiting over 30 minutes.  One hour ago, the pain was quite intense, but at the present time.  Pain has improved dramatically.  he has a history of pouchitis, but the symptoms are not typical  Preventive Screening-Counseling & Management  Alcohol-Tobacco     Smoking Status: never  Allergies (verified): No Known Drug Allergies  Past History:  Past Medical History: Reviewed history from 11/12/2007 and no changes required. Hyperlipidemia Asthma GERD Ulcerative colitis Hiatal Hernia  Past Surgical History: Reviewed history from 11/12/2007 and no changes required. Colectomy with ileoanal anastsamosis 2003 Tonsillectomy Cholecystectomy  Review of Systems       The patient complains of anorexia and abdominal pain.  The patient denies fever, weight loss, weight gain, vision loss, decreased hearing, hoarseness, chest pain, syncope, dyspnea on exertion, peripheral edema, prolonged cough, headaches, hemoptysis, melena, hematochezia, severe indigestion/heartburn, hematuria, incontinence, genital  sores, muscle weakness, suspicious skin lesions, transient blindness, difficulty walking, depression, unusual weight change, abnormal bleeding, enlarged lymph nodes, angioedema, breast masses, and testicular masses.    Physical Exam  General:  Well-developed,well-nourished,in no acute distress; alert,appropriate and cooperative throughout examination Head:  Normocephalic and atraumatic without obvious abnormalities. No apparent alopecia or balding. Mouth:  Oral mucosa and oropharynx without lesions or exudates.  Teeth in good repair. Neck:  No deformities, masses, or tenderness noted. Lungs:  Normal respiratory effort, chest expands symmetrically. Lungs are clear to auscultation, no crackles or wheezes. Heart:  Normal rate and regular rhythm. S1 and S2 normal without gallop, murmur, click, rub or other extra sounds. Abdomen:  suggestion of some mild left lower quadrant tenderness bowel sounds normal no distention lower midline scar   Impression & Recommendations:  Problem # 1:  ABDOMINAL PAIN (ICD-789.00) pain is presently much improved.  Will increase on the results of the b.i.d. regimen.  Short term and treat with an antiasthmatic.  Will tree with a clear liquid diet and advance in 24 hours if improved.  If he clinically deteriorates will call the office and set up for GI evaluation  Problem # 2:  GERD (ICD-530.81)  His updated medication list for this problem includes:    Omeprazole 40 Mg Cpdr (Omeprazole) ..... One by mouth daily  His updated medication list for this problem includes:    Omeprazole 40 Mg Cpdr (Omeprazole) ..... One by mouth daily  Complete Medication List: 1)  Omeprazole 40 Mg Cpdr (Omeprazole) .... One by  mouth daily 2)  Bayer Low Strength 81 Mg Tbec (Aspirin) .... Once daily 3)  Pulmicort Flexhaler 180 Mcg/act Inha (Budesonide (inhalation)) .... Once daily 4)  Krill Oil 1000 Mg Caps (Krill oil) .... Two by mouth two times a day 5)  Symbicort 160-4.5 Mcg/act  Aero (Budesonide-formoterol fumarate) .... Two puff two times a day 6)  Benzonatate 200 Mg Caps (Benzonatate) .... One by mouth q 6 hours 7)  Fexofenadine-pseudoephedrine 60-120 Mg Xr12h-tab (Fexofenadine-pseudoephedrine) .... One by mouth two times a day 8)  Nasacort Aq 55 Mcg/act Aers (Triamcinolone acetonide(nasal)) .... Two sprays in nostrils daily 9)  Promethazine Hcl 25 Mg Tabs (Promethazine hcl) .... One every 6 hours as needed for nausea [MLI_FORM:[id number]][MLI_FORM:[id number]]

## 2010-04-27 NOTE — Medication Information (Signed)
Summary: Denial of Coverage for Fexofenadine  Denial of Coverage for Fexofenadine   Imported By: Laural Benes 06/26/2009 15:49:49  _____________________________________________________________________  External Attachment:    Type:   Image     Comment:   External Document

## 2010-05-12 ENCOUNTER — Other Ambulatory Visit: Payer: Self-pay | Admitting: Internal Medicine

## 2010-05-24 ENCOUNTER — Other Ambulatory Visit: Payer: 59 | Admitting: Internal Medicine

## 2010-05-24 DIAGNOSIS — Z0000 Encounter for general adult medical examination without abnormal findings: Secondary | ICD-10-CM

## 2010-05-24 LAB — LIPID PANEL
HDL: 59.8 mg/dL (ref >39.00)
Total CHOL/HDL Ratio: 3

## 2010-05-24 LAB — POCT URINALYSIS DIPSTICK
Bilirubin, UA: NEGATIVE
Leukocytes, UA: NEGATIVE
Protein, UA: NEGATIVE
pH, UA: 5.5

## 2010-05-24 LAB — BASIC METABOLIC PANEL
CO2: 28 mEq/L (ref 19–32)
Calcium: 9.2 mg/dL (ref 8.4–10.5)
Creatinine, Ser: 1.2 mg/dL (ref 0.4–1.5)
GFR: 70.43 mL/min (ref >60.00)
Sodium: 141 mEq/L (ref 135–145)

## 2010-05-24 LAB — CBC WITH DIFFERENTIAL/PLATELET
Basophils Relative: 0.5 % (ref 0.0–3.0)
Eosinophils Absolute: 0.3 10*3/uL (ref 0.0–0.7)
Eosinophils Relative: 5.1 % — ABNORMAL HIGH (ref 0.0–5.0)
Hemoglobin: 16.5 g/dL (ref 13.0–17.0)
Lymphocytes Relative: 33.2 % (ref 12.0–46.0)
MCHC: 35.4 g/dL (ref 30.0–36.0)
Monocytes Relative: 6.4 % (ref 3.0–12.0)
Neutro Abs: 3.3 10*3/uL (ref 1.4–7.7)
Neutrophils Relative %: 54.8 % (ref 43.0–77.0)
RBC: 5.08 Mil/uL (ref 4.22–5.81)
WBC: 6 10*3/uL (ref 4.5–10.5)

## 2010-05-24 LAB — HEPATIC FUNCTION PANEL
AST: 28 U/L (ref 0–37)
Albumin: 4.1 g/dL (ref 3.5–5.2)
Alkaline Phosphatase: 37 U/L — ABNORMAL LOW (ref 39–117)
Bilirubin, Direct: 0.2 mg/dL (ref 0.0–0.3)
Total Protein: 6.6 g/dL (ref 6.0–8.3)

## 2010-05-24 LAB — TSH: TSH: 1.25 u[IU]/mL (ref 0.35–5.50)

## 2010-05-24 LAB — PSA: PSA: 0.46 ng/mL (ref 0.10–4.00)

## 2010-05-31 ENCOUNTER — Encounter: Payer: Self-pay | Admitting: Internal Medicine

## 2010-06-11 ENCOUNTER — Encounter: Payer: Self-pay | Admitting: Internal Medicine

## 2010-06-14 ENCOUNTER — Encounter: Payer: Self-pay | Admitting: Internal Medicine

## 2010-06-25 ENCOUNTER — Ambulatory Visit (INDEPENDENT_AMBULATORY_CARE_PROVIDER_SITE_OTHER): Payer: 59 | Admitting: Internal Medicine

## 2010-06-25 ENCOUNTER — Encounter: Payer: Self-pay | Admitting: Internal Medicine

## 2010-06-25 VITALS — BP 110/70 | HR 76 | Temp 98.2°F | Resp 14 | Ht 75.0 in | Wt 208.0 lb

## 2010-06-25 DIAGNOSIS — Z0000 Encounter for general adult medical examination without abnormal findings: Secondary | ICD-10-CM

## 2010-06-25 DIAGNOSIS — J45991 Cough variant asthma: Secondary | ICD-10-CM

## 2010-06-25 DIAGNOSIS — L570 Actinic keratosis: Secondary | ICD-10-CM | POA: Insufficient documentation

## 2010-06-25 DIAGNOSIS — E785 Hyperlipidemia, unspecified: Secondary | ICD-10-CM

## 2010-06-25 DIAGNOSIS — J309 Allergic rhinitis, unspecified: Secondary | ICD-10-CM

## 2010-06-25 MED ORDER — BUDESONIDE-FORMOTEROL FUMARATE 160-4.5 MCG/ACT IN AERO: 2.0000 | INHALATION_SPRAY | Freq: Two times a day (BID) | RESPIRATORY_TRACT | Status: DC

## 2010-06-25 MED ORDER — MOMETASONE FUROATE 50 MCG/ACT NA SUSP: 2.0000 | Freq: Every day | NASAL | Status: AC

## 2010-06-25 NOTE — Progress Notes (Signed)
  Subjective:    Patient ID: Joshua Li, male    DOB: [birth year], 50 y.o.   MRN: [id number]  HPI is a 50 year old white male presents for his complete physical examination he is to have 2 complaints today one is worsening allergic rhinitis with runny nose stuffiness seasonal allergies primary etiology of this finding.  The second complaint today is asthma he had been on asthma controller Symbicort 2 puffs daily and stopped it due to some insurance changes and he has a mild to moderate flare of his asthma he noticed daily wheezing when he coughs he notes increased wheezing on examination he has musical wheezes throughout its fields.      Review of Systems  Constitutional: Negative for fever and fatigue.  HENT: Negative for hearing loss, congestion, neck pain and postnasal drip.   Eyes: Negative for discharge, redness and visual disturbance.  Respiratory: Negative for cough, shortness of breath and wheezing.   Cardiovascular: Negative for leg swelling.  Gastrointestinal: Negative for abdominal pain, constipation and abdominal distention.  Genitourinary: Negative for urgency and frequency.  Musculoskeletal: Negative for joint swelling and arthralgias.  Skin: Negative for color change and rash.  Neurological: Negative for weakness and light-headedness.  Hematological: Negative for adenopathy.  Psychiatric/Behavioral: Negative for behavioral problems.   Past Medical History  Diagnosis Date  . Hyperlipidemia   . Asthma   . GERD (gastroesophageal reflux disease)   . Ulcerative colitis   . Hiatal hernia   . Allergy    Past Surgical History  Procedure Date  . Tonsillectomy   . Cholecystectomy   . Colectomy     reports that he has never smoked. He does not have any smokeless tobacco history on file. He reports that he drinks alcohol. He reports that he does not use illicit drugs. family history includes Cancer in his father and mother and Heart disease in his father. No Known  Allergies  Objective:   Physical Exam  Constitutional: He appears well-developed and well-nourished.  HENT:  Head: Normocephalic and atraumatic.  Eyes: Conjunctivae are normal. Pupils are equal, round, and reactive to light.  Neck: Normal range of motion. Neck supple.  Cardiovascular: Normal rate and regular rhythm.   Pulmonary/Chest: Effort normal and breath sounds normal.  Abdominal: Soft. Bowel sounds are normal.  Genitourinary: Prostate normal and penis normal.  Skin: Skin is warm.   actinic keratosis noted on the bridge of the nose        Assessment & Plan:   Patient presents for yearly preventative medicine examination.   all immunizations and health maintenance protocols were reviewed with the patient and they are up to date with these protocols.   screening laboratory values were reviewed with the patient including screening of hyperlipidemia PSA renal function and hepatic function.   There medications past medical history social history problem list and allergies were reviewed in detail.   Goals were established with regard to weight loss exercise diet in compliance with medications His asthma flare will require treatment with resumption of Symbicort and Nasonex these prescriptions will be called in samples given physical examination today there was noted an actinic keratosis on his nose he gave informed consent and cryotherapy will be applied to the spot

## 2010-06-25 NOTE — Assessment & Plan Note (Signed)
Informed consent was obtained in the lesion was treated for 60 seconds of liquid nitrogen application the patient tolerated the procedure well as procedural care was discussed with the patient and instructions should the lesion reappears contact our office immediately

## 2010-06-25 NOTE — Assessment & Plan Note (Signed)
Seasonal allergic rhinitis

## 2010-08-13 NOTE — Assessment & Plan Note (Signed)
[place] OFFICE NOTE   SAULO, ANTHIS                        MRN:          [id number]  DATE:02/13/2006                            DOB:          24-Aug-1960    REFERRING PHYSICIAN:  Ricard Dillon, MD   PROBLEM:  To establish local GI follow up with history of ulcerative  colitis.   HISTORY:  Mr. Trentman is a pleasant 50 year old white male known to Dr. Fuller Plan  who has not been seen here since 2002; at which time, he was referred to Dr.  Kristie Cowman at [facility] for management of refractory ulcerative colitis.  He had  been placed in a trial there and was treated with Remicade which he also  failed and then ultimately underwent total colectomy with J-pouch in 2003.  This was done at [facility] as well.  He says that he has done very well since that  time.  Did have an episode of pouchitis in 2005 and what sounds like one  other episode of pouchitis.  He was treated with Flagyl at that time and now  takes a maintenance dose of Flagyl 1 p.o. daily.  He is uncertain whether  this is 250 or 500 mg dose.  He has been followed by Dr. Arnoldo Morale.  He says  that generally he has six to seven loose bowel movements per day, which is  his norm.  Occasional streak of bright red blood which he attributes to  hemorrhoids.  He is not having any difficulty with abdominal pain, cramping,  bloating, gassiness, ect. and feels well.  He has also been kept on a  maintenance dose of Pentasa 250 mg four p.o. t.i.d.  He says usually he gets  in about eight tablets per day.  He has also had history of iron-deficiency  anemia and has been on a 325 mg of generic iron daily.  He has chronic GERD  and is maintained on Prilosec which works well.  He says if he forgets his  Prilosec or stop it his symptoms immediately return.  He has no dysphagia or  odynophagia.   LABS:  Iron January 09, 2006 was 123, folate 9.3, transferrin 209, B12 was  379.   WBC 5.9, hemoglobin 17.5, hematocrit of 51.6, MCV of 92.  Electrolytes  were within normal limits.  Liver function studies normal.   CURRENT MEDICATIONS:  1. Pentasa 250 mg 4 p.o. b.i.d.  2. Flagyl 1 p.o. daily, uncertain of dosage.  3. Zyrtec 1 p.o. daily.  4. Prilosec 20 mg p.o. daily.  5. Iron tablet one daily.  6. Fish oil daily.  7. Baby aspirin 81 mg daily.  8. Kids multivitamin daily.   ALLERGIES:  No known drug allergies.   PAST MEDICAL HISTORY:  1. History of refractory ulcerative colitis, now status post total      colectomy with J-pouch 2003.  2. Cholecystectomy 2003.   FAMILY HISTORY:  Pertinent for ovarian cancer and breast cancer in his  mother.  Father with history of bladder cancer.  No family history of colon  cancer or inflammatory bowel disease.   SOCIAL HISTORY:  The patient is married, has one child.  He is employed in  radio.  He is a nonsmoker.  Drinks alcohol socially.   REVIEW OF SYSTEMS:  Reviewed and completely negative other than GI as above  and some chronic low back pain.   PHYSICAL EXAMINATION:  Well-developed, well-nourished white male in no acute  distress.  Weight is 231, blood pressure 114/68, pulse is 60.  HEENT:  Normocephalic and atraumatic.  EOMI.  PERRLA.  Sclerae anicteric.  CARDIOVASCULAR:  Regular rate and rhythm with S1-S2.  No murmur, rub, or  gallop.  PULMONARY:  Clear to A&P.  ABDOMEN:  Soft.  Bowel sounds are active.  He is nontender.  There is no  mass or hepatosplenomegaly.  He has low midline incisional scar.  RECTAL:  Exam is not done at this time.   IMPRESSION:  1. 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  2. Status post cholecystectomy.   PLAN:  1. Obtain records from Dr. Ellender Hose.  We do not at this time have his      operative notes available or any notes from Dr. Ellender Hose postoperatively.   2. Labs reviewed from October of 2007.  3. We will stop his oral iron.  4. Stop Pentasa as there is not any evidence that this would provide him      any benefit post total colectomy.  5. Okay to remain on Flagyl 1 p.o. daily for the time being.  6. After records, we will review Dr. Ellender Hose' recommendations regarding      long-term management for his pouchitis.     Nicoletta Ba, PA-C  Electronically Signed      Pricilla Riffle. Fuller Plan, MD, [facility]  Electronically Signed   AE/MedQ  DD: 02/13/2006  DT: 02/14/2006  Job #: [id number]   cc:   Ricard Dillon, MD

## 2010-08-19 ENCOUNTER — Encounter: Payer: Self-pay | Admitting: Family Medicine

## 2010-08-19 ENCOUNTER — Ambulatory Visit (INDEPENDENT_AMBULATORY_CARE_PROVIDER_SITE_OTHER): Payer: 59 | Admitting: Family Medicine

## 2010-08-19 VITALS — BP 120/84 | HR 86 | Temp 98.2°F | Resp 16 | Wt 209.0 lb

## 2010-08-19 DIAGNOSIS — J329 Chronic sinusitis, unspecified: Secondary | ICD-10-CM

## 2010-08-19 MED ORDER — HYDROCODONE-HOMATROPINE 5-1.5 MG/5ML PO SYRP: 5.0000 mL | ORAL_SOLUTION | Freq: Four times a day (QID) | ORAL | Status: AC | PRN

## 2010-08-19 MED ORDER — AZITHROMYCIN 250 MG PO TABS: 250.0000 mg | ORAL_TABLET | Freq: Every day | ORAL | Status: AC

## 2010-08-19 NOTE — Progress Notes (Signed)
  Subjective:    Patient ID: Joshua Li, male    DOB: 20-Feb-1961, 50 y.o.   MRN: [id number]  HPI Patient seen with respiratory illness. Onset over one week ago. Sore throat, nasal congestion, productive cough. Frontal sinus pressure. Denies any fever or chills. Patient is nonsmoker. Allegra-D without much relief. Cough especially bothersome at night.   Review of Systems  Constitutional: Positive for fatigue. Negative for fever, chills and appetite change.  HENT: Positive for congestion, sore throat, postnasal drip and sinus pressure. Negative for ear pain.   Eyes: Negative for visual disturbance.  Respiratory: Positive for cough. Negative for shortness of breath and wheezing.   Cardiovascular: Negative for chest pain.       Objective:   Physical Exam  Constitutional: He appears well-developed and well-nourished.  HENT:  Right Ear: External ear normal.  Left Ear: External ear normal.  Nose: Nose normal.  Mouth/Throat: Oropharynx is clear and moist. No oropharyngeal exudate.  Eyes: Right eye exhibits no discharge. Left eye exhibits no discharge.  Neck: Neck supple.  Cardiovascular: Normal rate, regular rhythm and normal heart sounds.   Pulmonary/Chest: Effort normal and breath sounds normal. No respiratory distress. He has no wheezes. He has no rales.  Lymphadenopathy:    He has no cervical adenopathy.          Assessment & Plan:  Acute sinusitis. Suspect viral. Hycodan cough syrup for cough suppression. Observe for now. Add Zithromax if symptoms persist or worsen over the next week

## 2010-10-25 ENCOUNTER — Encounter: Payer: Self-pay | Admitting: Internal Medicine

## 2010-10-25 ENCOUNTER — Ambulatory Visit (INDEPENDENT_AMBULATORY_CARE_PROVIDER_SITE_OTHER): Payer: 59 | Admitting: Internal Medicine

## 2010-10-25 DIAGNOSIS — Z0000 Encounter for general adult medical examination without abnormal findings: Secondary | ICD-10-CM

## 2010-10-25 DIAGNOSIS — M533 Sacrococcygeal disorders, not elsewhere classified: Secondary | ICD-10-CM

## 2010-10-25 DIAGNOSIS — J309 Allergic rhinitis, unspecified: Secondary | ICD-10-CM

## 2010-10-25 DIAGNOSIS — M19049 Primary osteoarthritis, unspecified hand: Secondary | ICD-10-CM

## 2010-10-25 DIAGNOSIS — K5080 Crohn's disease of both small and large intestine without complications: Secondary | ICD-10-CM

## 2010-10-25 NOTE — Progress Notes (Signed)
  Subjective:    Patient ID: Joshua Li, male    DOB: [birth year], 50 y.o.   MRN: [id number]  HPI is a 50 year old white male with a history of Crohn's disease who is status post colectomy who presents for routine followup.  He has history of anemia probably secondary to iron absorption deficiency.  He's been doing reasonably well he had an episode of sinusitis in May he does have chronic seasonal allergies and uses antihistamines on a when necessary basis he is currently not using his Allegra.      Review of Systems  Constitutional: Negative for fever and fatigue.  HENT: Negative for hearing loss, congestion, neck pain and postnasal drip.   Eyes: Negative for discharge, redness and visual disturbance.  Respiratory: Negative for cough, shortness of breath and wheezing.   Cardiovascular: Negative for leg swelling.  Gastrointestinal: Negative for abdominal pain, constipation and abdominal distention.  Genitourinary: Negative for urgency and frequency.  Musculoskeletal: Negative for joint swelling and arthralgias.  Skin: Negative for color change and rash.  Neurological: Negative for weakness and light-headedness.  Hematological: Negative for adenopathy.  Psychiatric/Behavioral: Negative for behavioral problems.   Past Medical History  Diagnosis Date  . Hyperlipidemia   . Asthma   . GERD (gastroesophageal reflux disease)   . Ulcerative colitis   . Hiatal hernia   . Allergy    Past Surgical History  Procedure Date  . Tonsillectomy   . Cholecystectomy   . Colectomy     reports that he has never smoked. He does not have any smokeless tobacco history on file. He reports that he drinks alcohol. He reports that he does not use illicit drugs. family history includes Cancer in his father and mother and Heart disease in his father. No Known Allergies     Objective:   Physical Exam  Nursing note and vitals reviewed. Constitutional: He appears well-developed and well-nourished.    HENT:  Head: Normocephalic and atraumatic.  Eyes: Conjunctivae are normal. Pupils are equal, round, and reactive to light.  Neck: Normal range of motion. Neck supple.  Cardiovascular: Normal rate and regular rhythm.   Pulmonary/Chest: Effort normal and breath sounds normal.  Abdominal: Soft. Bowel sounds are normal.          Assessment & Plan:  We discussed in detail prophylaxis for his chronic allergies and he has seasonal sinusitis.  We will do allergy testing prior to his physical in the spring to see if we can find factors that he can avoid.  We discussed the initiation of the Allegra and saline gavage probably late January through June.  He is stable from the standpoint of his colitis having seen the gastroenterologist last year a CBC differential and appropriate blood work will be drawn with his physical labs in March he has had no recent flare of his asthma Cholesterol is at goal No problem-specific assessment & plan notes found for this encounter.

## 2010-10-25 NOTE — Patient Instructions (Signed)
Back Exercises Back exercises help treat and prevent back injuries. The goal of back exercises is to increase the strength of your abdominal and back muscles and the flexibility of your back. These exercises should be started when you no longer have back pain. Back exercises include: 1. Pelvic Tilt - Lie on your back with your knees bent. Tilt your pelvis until the lower part of your back is against the floor. Hold this position 5-10 sec and repeat 5-10 times.  2. Knee to Chest - Pull first one knee up against your chest and hold for 20-30 seconds, repeat this with the other knee, and then both knees. This may be done with the other leg straight or bent, whichever feels better.  3. Sit-Ups or Curl-Ups - Bend your knees 90 degrees. Start with tilting your pelvis, and do a partial, slow sit-up, lifting your trunk only 30-45 degrees off the floor. Take at least 2-3 sec for each sit-up. Do not do sit-ups with your knees out straight. If partial sit-ups are difficult, simply do the above but with only tightening your abdominal muscles and holding it as directed.  4. Hip-Lift - Lie on your back with your knees flexed 90 degrees. Push down with your feet and shoulders as you raise your hips a couple inches off the floor; hold for 10 sec, repeat 5-10 times.  5. Back arches - Lie on your stomach, propping yourself up on bent elbows. Slowly press on your hands, causing an arch in your low back. Repeat 3-5 times. Any initial stiffness and discomfort should lessen with repetition over time.  6. Shoulder-Lifts - Lie face down with arms beside your body. Keep hips and torso pressed to floor as you slowly lift your head and shoulders off the floor.  Do not overdo your exercises, especially in the beginning. Exercises may cause you some mild back discomfort which lasts for a few minutes; however, if the pain is more severe, or lasts for more than 15 minutes, do not continue exercises until you see your caregiver.  Improvement with exercise therapy for back problems is slow.  See your caregivers for assistance with developing a proper back exercise program. Document Released: 04/21/2004 Document Re-Released: 06/10/2008 [employer] Patient Information 2011 [employer]. 

## 2011-02-16 ENCOUNTER — Other Ambulatory Visit: Payer: Self-pay | Admitting: Internal Medicine

## 2011-06-23 ENCOUNTER — Other Ambulatory Visit (INDEPENDENT_AMBULATORY_CARE_PROVIDER_SITE_OTHER): Payer: 59

## 2011-06-23 DIAGNOSIS — Z0000 Encounter for general adult medical examination without abnormal findings: Secondary | ICD-10-CM

## 2011-06-23 DIAGNOSIS — K5080 Crohn's disease of both small and large intestine without complications: Secondary | ICD-10-CM

## 2011-06-23 LAB — HEPATIC FUNCTION PANEL
ALT: 58 U/L — ABNORMAL HIGH (ref 0–53)
AST: 27 U/L (ref 0–37)
Alkaline Phosphatase: 47 U/L (ref 39–117)
Bilirubin, Direct: 0.2 mg/dL (ref 0.0–0.3)
Total Protein: 6.8 g/dL (ref 6.0–8.3)

## 2011-06-23 LAB — CBC WITH DIFFERENTIAL/PLATELET
Basophils Relative: 0.3 % (ref 0.0–3.0)
Eosinophils Absolute: 0.1 10*3/uL (ref 0.0–0.7)
Hemoglobin: 15.7 g/dL (ref 13.0–17.0)
Lymphocytes Relative: 38.1 % (ref 12.0–46.0)
MCHC: 34.8 g/dL (ref 30.0–36.0)
MCV: 91.5 fl (ref 78.0–100.0)
Neutro Abs: 2.6 10*3/uL (ref 1.4–7.7)
RBC: 4.93 Mil/uL (ref 4.22–5.81)

## 2011-06-23 LAB — LIPID PANEL
HDL: 57.4 mg/dL (ref >39.00)
Total CHOL/HDL Ratio: 3

## 2011-06-23 LAB — POCT URINALYSIS DIPSTICK
Spec Grav, UA: >=1.03
Urobilinogen, UA: 0.2

## 2011-06-23 LAB — TSH: TSH: 1.77 u[IU]/mL (ref 0.35–5.50)

## 2011-06-23 LAB — BASIC METABOLIC PANEL
BUN: 18 mg/dL (ref 6–23)
Chloride: 106 mEq/L (ref 96–112)
Creatinine, Ser: 1.1 mg/dL (ref 0.4–1.5)
Glucose, Bld: 89 mg/dL (ref 70–99)

## 2011-06-24 ENCOUNTER — Other Ambulatory Visit: Payer: 59 | Admitting: Internal Medicine

## 2011-06-24 LAB — ALLERGY PROFILE REGION II-DC, DE, MD, ~~LOC~~, VA
Allergen, D pternoyssinus,d7: <0.1 kU/L (ref <0.35)
Bermuda Grass: 0.26 kU/L (ref <0.35)
Box Elder IgE: <0.1 kU/L (ref <0.35)
Cladosporium Herbarum: <0.1 kU/L (ref <0.35)
Cockroach: <0.1 kU/L (ref <0.35)
Common Ragweed: <0.1 kU/L (ref <0.35)
IgE (Immunoglobulin E), Serum: 4.6 IU/mL (ref 0.0–180.0)
Lamb's Quarters: <0.1 kU/L (ref <0.35)

## 2011-06-29 ENCOUNTER — Ambulatory Visit (INDEPENDENT_AMBULATORY_CARE_PROVIDER_SITE_OTHER): Payer: 59 | Admitting: Internal Medicine

## 2011-06-29 VITALS — BP 120/90 | HR 60 | Temp 98.5°F | Resp 16 | Ht 75.0 in | Wt 214.0 lb

## 2011-06-29 DIAGNOSIS — Z23 Encounter for immunization: Secondary | ICD-10-CM

## 2011-06-29 DIAGNOSIS — Z0000 Encounter for general adult medical examination without abnormal findings: Secondary | ICD-10-CM

## 2011-06-29 NOTE — Progress Notes (Signed)
  Subjective:    Patient ID: Joshua Li, male    DOB: September 18, 1960, 51 y.o.   MRN: [id number]  HPI  not seen, pt had to leave   Review of Systems     Objective:   Physical Exam        Assessment & Plan:

## 2011-07-01 ENCOUNTER — Encounter: Payer: 59 | Admitting: Internal Medicine

## 2011-07-06 ENCOUNTER — Encounter: Payer: 59 | Admitting: Internal Medicine

## 2011-07-06 ENCOUNTER — Encounter: Payer: Self-pay | Admitting: Internal Medicine

## 2011-07-06 ENCOUNTER — Ambulatory Visit (INDEPENDENT_AMBULATORY_CARE_PROVIDER_SITE_OTHER): Payer: 59 | Admitting: Internal Medicine

## 2011-07-06 VITALS — BP 124/78 | HR 72 | Temp 98.1°F | Resp 16 | Ht 75.0 in | Wt 214.0 lb

## 2011-07-06 DIAGNOSIS — G589 Mononeuropathy, unspecified: Secondary | ICD-10-CM

## 2011-07-06 DIAGNOSIS — K5289 Other specified noninfective gastroenteritis and colitis: Secondary | ICD-10-CM

## 2011-07-06 DIAGNOSIS — Z0000 Encounter for general adult medical examination without abnormal findings: Secondary | ICD-10-CM

## 2011-07-06 DIAGNOSIS — G629 Polyneuropathy, unspecified: Secondary | ICD-10-CM

## 2011-07-06 DIAGNOSIS — K529 Noninfective gastroenteritis and colitis, unspecified: Secondary | ICD-10-CM

## 2011-07-06 MED ORDER — OMEPRAZOLE 40 MG PO CPDR: 40.0000 mg | DELAYED_RELEASE_CAPSULE | Freq: Every day | ORAL | Status: DC

## 2011-07-06 MED ORDER — CYANOCOBALAMIN 500 MCG PO TBDP: 1.0000 [IU] | ORAL_TABLET | Freq: Two times a day (BID) | ORAL | Status: DC

## 2011-07-06 MED ORDER — BUDESONIDE 180 MCG/ACT IN AEPB: 1.0000 | INHALATION_SPRAY | Freq: Two times a day (BID) | RESPIRATORY_TRACT | Status: DC

## 2011-07-06 MED ORDER — METRONIDAZOLE 250 MG PO TABS: 250.0000 mg | ORAL_TABLET | Freq: Two times a day (BID) | ORAL | Status: DC

## 2011-07-06 NOTE — Progress Notes (Signed)
Subjective:    Patient ID: Joshua Li, male    DOB: 20-Jul-1960, 51 y.o.   MRN: [id number]  HPI  Patient is a 51 year old white male who presents for a yearly physical examination.  He has a history of ulcerative colitis status post large bowel colectomy and resultant pouch placement.  History of pouchitis that was treated by Flagyl currently he has mild to moderate abdominal bloating but no other symptomology.  He has a history of chronic moderate low back pain and hasn't had history of intermittent rapid heart rates that he cannot relate to activity medications rest hypoglycemia or any other symptoms.  These episodes are rare and lasts for a few minutes to an hour.  Review of Systems  Constitutional: Negative for fever and fatigue.  HENT: Negative for hearing loss, congestion, neck pain and postnasal drip.   Eyes: Negative for discharge, redness and visual disturbance.  Respiratory: Positive for wheezing. Negative for cough and shortness of breath.   Cardiovascular: Positive for palpitations. Negative for leg swelling.  Gastrointestinal: Negative for abdominal pain, constipation and abdominal distention.  Genitourinary: Negative for urgency and frequency.  Musculoskeletal: Negative for joint swelling and arthralgias.  Skin: Negative for color change and rash.  Neurological: Negative for weakness and light-headedness.  Hematological: Negative for adenopathy.  Psychiatric/Behavioral: Negative for behavioral problems.   Past Medical History  Diagnosis Date  . Hyperlipidemia   . Asthma   . GERD (gastroesophageal reflux disease)   . Ulcerative colitis   . Hiatal hernia   . Allergy     History   Social History  . Marital Status: Divorced    Spouse Name: N/A    Number of Children: N/A  . Years of Education: N/A   Occupational History  . Not on file.   Social History Main Topics  . Smoking status: Never Smoker   . Smokeless tobacco: Not on file  . Alcohol Use: Yes    . Drug Use: No  . Sexually Active: Yes   Other Topics Concern  . Not on file   Social History Narrative  . No narrative on file    Past Surgical History  Procedure Date  . Tonsillectomy   . Cholecystectomy   . Colectomy     Family History  Problem Relation Age of Onset  . Cancer Mother     breast and ovarian  . Cancer Father     stomach & bladder  . Heart disease Father     No Known Allergies  Current Outpatient Prescriptions on File Prior to Visit  Medication Sig Dispense Refill  . aspirin 81 MG tablet Take 81 mg by mouth daily.        Marland Kitchen loratadine (CLARITIN) 10 MG tablet Take 10 mg by mouth daily.      Marland Kitchen triamcinolone (NASACORT) 55 MCG/ACT nasal inhaler 2 sprays by Nasal route daily.        Marland Kitchen DISCONTD: budesonide-formoterol (SYMBICORT) 160-4.5 MCG/ACT inhaler Inhale 2 puffs into the lungs 2 (two) times daily as needed.      Marland Kitchen DISCONTD: omeprazole (PRILOSEC) 40 MG capsule TAKE 1 CAPSULE DAILY  90 capsule  1  . budesonide (PULMICORT FLEXHALER) 180 MCG/ACT inhaler Inhale 1 puff into the lungs 2 (two) times daily.      Marland Kitchen DISCONTD: budesonide-formoterol (SYMBICORT) 160-4.5 MCG/ACT inhaler Inhale 2 puffs into the lungs 2 (two) times daily.  18 g  3    BP 124/78  Pulse 72  Temp 98.1 F (36.7 C)  Resp 16  Ht 6' 3"  (1.905 m)  Wt 214 lb (97.07 kg)  BMI 26.75 kg/m2       Objective:   Physical Exam  Nursing note and vitals reviewed. Constitutional: He is oriented to person, place, and time. He appears well-developed and well-nourished.  HENT:  Head: Normocephalic and atraumatic.  Eyes: Conjunctivae are normal. Pupils are equal, round, and reactive to light.  Neck: Normal range of motion. Neck supple.  Cardiovascular: Normal rate and regular rhythm.   Pulmonary/Chest: Effort normal and breath sounds normal.  Abdominal: Soft. Bowel sounds are normal.  Genitourinary: Rectum normal and prostate normal.  Musculoskeletal: Normal range of motion.  Neurological: He is  alert and oriented to person, place, and time.  Skin: Skin is warm and dry.  Psychiatric: He has a normal mood and affect. His behavior is normal.          Assessment & Plan:   Patient presents for yearly preventative medicine examination.   all immunizations and health maintenance protocols were reviewed with the patient and they are up to date with these protocols.   screening laboratory values were reviewed with the patient including screening of hyperlipidemia PSA renal function and hepatic function.   There medications past medical history social history problem list and allergies were reviewed in detail.   Goals were established with regard to weight loss exercise diet in compliance with medications I believe his GI symptoms of mild/moderate bloating or more likely to represent a mild colitis rather than ulcer issues since he is on the Prilosec faithfully.  I recommended that we resume the Flagyl for a short period of time since he treated this inflammation pouchitis before take it twice daily at low dose and see how minus abdominal discomfort is.  We have discussed his diagnosis and believe that his diagnosis is more likely ulcerative colitis than Crohn's disease he states that the specialist at [facility] had described it as most of her life  His mild to moderate seasonal asthma for which we will use a Pulmicort Flexhaler he has taken this in the past and tolerated it well otherwise we'll see him back in 4 months time

## 2011-07-06 NOTE — Patient Instructions (Addendum)
The patient is instructed to continue all medications as prescribed. Schedule followup with check out clerk upon leaving the clinic Low Fiber and Residue Restricted Diet A low fiber diet restricts foods that contain carbohydrates that are not digested in the small intestine. A diet containing about 10 g of fiber is considered low fiber. The diet needs to be individualized to suit patient tolerances and preferences and to avoid unnecessary restrictions. Generally, the foods emphasized in a low fiber diet have no skins or seeds. They may have been processed to remove bran, germ, or husks. Cooking may not necessarily eliminate the fiber. Cooking may, in fact, enable a greater quantity of fiber to be consumed in a lesser volume. Legumes and nuts are also restricted. The term low residue has also been used to describe low fiber diets, although the two are not the same. Residue refers to any substance that adds to bowel (colonic) contents, such as sloughed cells and intestinal bacteria, in addition to fiber. Residue-containing foods, prunes and prune juice, milk, and connective tissue from meats may also need to be eliminated. It is important to eliminate these foods during sudden (acute) attacks of inflammatory bowel disease, when there is a partial obstruction due to another reason, or when minimal fecal output is desired. When these problems are gone, a more normal diet may be used. PURPOSE  Prevent blockage of a partially obstructed or narrowed gastrointestinal tract.   Reduce stool weight and volume.   Slow the movement of waste.  WHEN IS THIS DIET USED?  Acute phase of Crohn's disease, ulcerative colitis, regional enteritis, or diverticulitis.   Narrowing (stenosis) of intestinal or esophageal tubes (lumina).   Transitional diet following surgery, injury (trauma), or illness.  ADEQUACY This diet is nutritionally adequate based on individual food choices according to the Recommended Dietary  Allowances of the [employer]. CHOOSING FOODS Check labels, especially on foods from the starch list. Often, dietary fiber content is listed with the Nutrition Facts panel.  Breads and Starches  Allowed: White, [place], and pita breads, plain rolls, buns, or sweet rolls, doughnuts, waffles, pancakes, bagels. Plain muffins, sweet breads, biscuits, matzoth. Flour. Soda, saltine, or graham crackers. Pretzels, rusks, melba toast, zwieback. Cooked cereals: cornmeal, farina, cream cereals. Dry cereals: refined corn, wheat, rice, and oat cereals (check label). Potatoes prepared any way without skins, refined macaroni, spaghetti, noodles, refined rice.   Avoid: Bread, rolls, or crackers made with whole-wheat, multigrains, rye, bran seeds, nuts, or coconut. Corn tortillas, table-shells. Corn chips, tortilla chips. Cereals containing whole-grains, multigrains, bran, coconut, nuts, or raisins. Cooked or dry oatmeal. Coarse wheat cereals, granola. Cereals advertised as "high fiber." Potato skins. Whole-grain pasta, wild or brown rice. Popcorn.  Vegetables  Allowed:  Strained tomato and vegetable juices. Fresh: tender lettuce, cucumber, cabbage, spinach, bean sprouts. Cooked, canned: asparagus, bean sprouts, cut green or wax beans, cauliflower, pumpkin, beets, mushrooms, olives, spinach, yellow squash, tomato, tomato sauce (no seeds), zucchini (peeled), turnips. Canned sweet potatoes. Small amounts of celery, onion, radish, and green pepper may be used. Keep servings limited to  cup.   Avoid: Fresh, cooked, or canned: artichokes, baked beans, beet greens, broccoli, Brussels sprouts, French-style green beans, corn, kale, legumes, peas, sweet potatoes. Cooked: green or red cabbage, spinach. Avoid large servings of any vegetables.  Fruit  Allowed:  All fruit juices except prune juice. Cooked or canned: apricots applesauce, cantaloupe, cherries, grapefruit, grapes, kiwi, mandarin oranges, peaches, pears,  fruit cocktail, pineapple, plums, watermelon. Fresh: banana, grapes, cantaloupe, avocado,  cherries, pineapple, grapefruit, kiwi, nectarines, peaches, oranges, blueberries, plums. Keep servings limited to  cup or 1 piece.   Avoid: Fresh: apple with or without skin, apricots, mango, pears, raspberries, strawberries. Prune juice, stewed or dried prunes. Dried fruits, raisins, dates. Avoid large servings of all fresh fruits.  Meat and Meat Substitutes  Allowed:  Ground or well-cooked tender beef, ham, veal, lamb, pork, or poultry. Eggs, plain cheese. Fish, oysters, shrimp, lobster, other seafood. Liver, organ meats.   Avoid: Tough, fibrous meats with gristle. Peanut butter, smooth or chunky. Cheese with seeds, nuts, or other foods not allowed. Nuts, seeds, legumes, dried peas, beans, lentils.  Milk  Allowed:  All milk products except those not allowed. Milk and milk product consumption should be minimal when low residue is desired.   Avoid: Yogurt that contains nuts or seeds.  Soups and Combination Foods  Allowed:  Bouillon, broth, or cream soups made from allowed foods. Any strained soup. Casseroles or mixed dishes made with allowed foods.   Avoid: Soups made from vegetables that are not allowed or that contain other foods not allowed.  Desserts and Sweets  Allowed:  Plain cakes and cookies, pie made with allowed fruit, pudding, custard, cream pie. Gelatin, fruit, ice, sherbet, frozen ice pops. Ice cream, ice milk without nuts. Plain hard candy, honey, jelly, molasses, syrup, sugar, chocolate syrup, gumdrops, marshmallows.   Avoid: Desserts, cookies, or candies that contain nuts, peanut butter, or dried fruits. Jams, preserves with seeds, marmalade.  Fats and Oils  Allowed:  Margarine, butter, cream, mayonnaise, salad oils, plain salad dressings made from allowed foods. Plain gravy, crisp bacon without rind.   Avoid: Seeds, nuts, olives. Avocados.  Beverages  Allowed:  All, except those  listed to avoid.   Avoid: Fruit juices with high pulp, prune juice.  Condiments  Allowed:  Ketchup, mustard, horseradish, vinegar, cream sauce, cheese sauce, cocoa powder. Spices in moderation: allspice, basil, bay leaves, celery powder or leaves, cinnamon, cumin powder, curry powder, ginger, mace, marjoram, onion or garlic powder, oregano, paprika, parsley flakes, ground pepper, rosemary, sage, savory, tarragon, thyme, turmeric.   Avoid: Coconut, pickles.  SAMPLE MEAL PLAN The following menu is provided as a sample. Your daily menu plans will vary. Be sure to include a minimum of the following each day in order to provide essential nutrients for the adult:  Starch/Bread/Cereal Group, 6 servings.   Fruit/Vegetable Group, 5 servings.   Meat/Meat Substitute Group, 2 servings.   Milk/Milk Substitute Group, 2 servings.  A serving is equal to  cup for fruits, vegetables, and cooked cereals or 1 piece for foods such as a piece of bread, 1 orange, or 1 apple. For dry cereals and crackers, use serving sizes listed on the label. Combination foods may count as full or partial servings from various food groups. Fats, desserts, and sweets may be added to the meal plan after the requirements for essential nutrients are met. SAMPLE MENU Breakfast   cup orange juice.   1 boiled egg.   1 slice white toast.   Margarine.    cup cornflakes.   1 cup milk.   Beverage.  Lunch   cup chicken noodle soup.   2 to 3 oz sliced roast beef.   2 slices seedless rye bread.   Mayonnaise.    cup tomato juice.   1 small banana.   Beverage.  Dinner  3 oz baked chicken.    cup scalloped potatoes.    cup cooked beets.   White dinner  roll.   Margarine.    cup canned peaches.   Beverage.  Document Released: 09/03/2001 Document Revised: 03/03/2011 Document Reviewed: 02/14/2011 [facility] Patient Information 2012 [place].

## 2011-08-13 ENCOUNTER — Other Ambulatory Visit: Payer: Self-pay | Admitting: Internal Medicine

## 2011-09-23 ENCOUNTER — Other Ambulatory Visit: Payer: Self-pay | Admitting: Internal Medicine

## 2011-11-01 ENCOUNTER — Other Ambulatory Visit: Payer: Self-pay | Admitting: Internal Medicine

## 2011-11-07 ENCOUNTER — Ambulatory Visit (INDEPENDENT_AMBULATORY_CARE_PROVIDER_SITE_OTHER): Payer: 59 | Admitting: Internal Medicine

## 2011-11-07 ENCOUNTER — Encounter: Payer: Self-pay | Admitting: Internal Medicine

## 2011-11-07 VITALS — BP 136/80 | HR 72 | Temp 98.3°F | Resp 16 | Ht 75.0 in | Wt 224.0 lb

## 2011-11-07 DIAGNOSIS — K5080 Crohn's disease of both small and large intestine without complications: Secondary | ICD-10-CM

## 2011-11-07 DIAGNOSIS — E785 Hyperlipidemia, unspecified: Secondary | ICD-10-CM

## 2011-11-07 DIAGNOSIS — J45909 Unspecified asthma, uncomplicated: Secondary | ICD-10-CM

## 2011-11-07 NOTE — Progress Notes (Signed)
Subjective:    Patient ID: Joshua Li, male    DOB: Jan 01, 1961, 51 y.o.   MRN: [id number]  HPI Had a job related back injury where a chair was defective and the back broke off the chair the patient was seen by different orthopedic into the muscle relaxant and anti-inflammatory.   Colon stable Asthma stable    Review of Systems  Constitutional: Negative for fever and fatigue.  HENT: Negative for hearing loss, congestion, neck pain and postnasal drip.   Eyes: Negative for discharge, redness and visual disturbance.  Respiratory: Negative for cough, shortness of breath and wheezing.   Cardiovascular: Negative for leg swelling.  Gastrointestinal: Negative for abdominal pain, constipation and abdominal distention.  Genitourinary: Negative for urgency and frequency.  Musculoskeletal: Negative for joint swelling and arthralgias.  Skin: Negative for color change and rash.  Neurological: Negative for weakness and light-headedness.  Hematological: Negative for adenopathy.  Psychiatric/Behavioral: Negative for behavioral problems.  ASSESSMENT: Past Medical History  Diagnosis Date  . Hyperlipidemia   . Asthma   . GERD (gastroesophageal reflux disease)   . Ulcerative colitis   . Hiatal hernia   . Allergy     History   Social History  . Marital Status: Divorced    Spouse Name: N/A    Number of Children: N/A  . Years of Education: N/A   Occupational History  . Not on file.   Social History Main Topics  . Smoking status: Never Smoker   . Smokeless tobacco: Not on file  . Alcohol Use: Yes  . Drug Use: No  . Sexually Active: Yes   Other Topics Concern  . Not on file   Social History Narrative  . No narrative on file    Past Surgical History  Procedure Date  . Tonsillectomy   . Cholecystectomy   . Colectomy     Family History  Problem Relation Age of Onset  . Cancer Mother     breast and ovarian  . Cancer Father     stomach & bladder  . Heart disease Father       No Known Allergies  Current Outpatient Prescriptions on File Prior to Visit  Medication Sig Dispense Refill  . aspirin 81 MG tablet Take 81 mg by mouth daily.        . budesonide-formoterol (SYMBICORT) 160-4.5 MCG/ACT inhaler Inhale 2 puffs into the lungs 2 (two) times daily as needed.       . Cyanocobalamin (B-12 DOTS) 500 MCG TBDP Take 1 Units by mouth 2 (two) times daily.      Marland Kitchen loratadine (CLARITIN) 10 MG tablet Take 10 mg by mouth daily.      . metroNIDAZOLE (FLAGYL) 250 MG tablet TAKE 1 TABLET BY MOUTH TWICE DAILY  60 tablet  0  . omeprazole (PRILOSEC) 40 MG capsule Take 1 capsule (40 mg total) by mouth daily.  90 capsule  3  . triamcinolone (NASACORT) 55 MCG/ACT nasal inhaler Place 2 sprays into the nose daily as needed.       Marland Kitchen DISCONTD: budesonide (PULMICORT FLEXHALER) 180 MCG/ACT inhaler Inhale 1 puff into the lungs 2 (two) times daily.        BP 136/80  Pulse 72  Temp 98.3 F (36.8 C)  Resp 16  Ht 6' 3"  (1.905 m)  Wt 224 lb (101.606 kg)  BMI 28.00 kg/m2       Objective:   Physical Exam  Nursing note and vitals reviewed. Constitutional: He appears well-developed and well-nourished.  HENT:  Head: Normocephalic and atraumatic.  Eyes: Conjunctivae are normal. Pupils are equal, round, and reactive to light.  Neck: Normal range of motion. Neck supple.  Cardiovascular: Normal rate and regular rhythm.   Pulmonary/Chest: Effort normal and breath sounds normal.  Abdominal: Soft. Bowel sounds are normal.          Assessment & Plan:  Allergies and asthma stable ans has been off the maintenance drugs Patient has a seasonal component to asthma and allergic rhinitis probably bimodal in fall and spring Stable allergies stable osteoarthritis is stable: colon Disease

## 2011-12-08 ENCOUNTER — Other Ambulatory Visit: Payer: Self-pay | Admitting: Internal Medicine

## 2012-02-16 ENCOUNTER — Other Ambulatory Visit: Payer: Self-pay | Admitting: Internal Medicine

## 2012-04-06 ENCOUNTER — Other Ambulatory Visit: Payer: Self-pay | Admitting: Internal Medicine

## 2012-05-23 ENCOUNTER — Other Ambulatory Visit: Payer: Self-pay | Admitting: Internal Medicine

## 2012-06-25 ENCOUNTER — Other Ambulatory Visit (INDEPENDENT_AMBULATORY_CARE_PROVIDER_SITE_OTHER): Payer: 59

## 2012-06-25 DIAGNOSIS — Z0000 Encounter for general adult medical examination without abnormal findings: Secondary | ICD-10-CM

## 2012-06-25 LAB — BASIC METABOLIC PANEL
Calcium: 9.3 mg/dL (ref 8.4–10.5)
GFR: 64.69 mL/min (ref >60.00)
Glucose, Bld: 100 mg/dL — ABNORMAL HIGH (ref 70–99)
Sodium: 138 mEq/L (ref 135–145)

## 2012-06-25 LAB — HEPATIC FUNCTION PANEL
AST: 23 U/L (ref 0–37)
Alkaline Phosphatase: 46 U/L (ref 39–117)
Total Bilirubin: 1 mg/dL (ref 0.3–1.2)

## 2012-06-25 LAB — CBC WITH DIFFERENTIAL/PLATELET
Basophils Absolute: 0 10*3/uL (ref 0.0–0.1)
Eosinophils Relative: 2.4 % (ref 0.0–5.0)
Hemoglobin: 16.8 g/dL (ref 13.0–17.0)
Lymphocytes Relative: 37 % (ref 12.0–46.0)
Monocytes Relative: 7.6 % (ref 3.0–12.0)
Neutro Abs: 3.5 10*3/uL (ref 1.4–7.7)
Platelets: 205 10*3/uL (ref 150.0–400.0)
RDW: 13.2 % (ref 11.5–14.6)
WBC: 6.6 10*3/uL (ref 4.5–10.5)

## 2012-06-25 LAB — POCT URINALYSIS DIPSTICK
Glucose, UA: NEGATIVE
Ketones, UA: NEGATIVE
Spec Grav, UA: 1.025

## 2012-06-25 LAB — LIPID PANEL
HDL: 46.8 mg/dL (ref >39.00)
VLDL: 29.4 mg/dL (ref 0.0–40.0)

## 2012-07-02 ENCOUNTER — Ambulatory Visit (INDEPENDENT_AMBULATORY_CARE_PROVIDER_SITE_OTHER): Payer: 59 | Admitting: Internal Medicine

## 2012-07-02 ENCOUNTER — Encounter: Payer: Self-pay | Admitting: Internal Medicine

## 2012-07-02 VITALS — BP 130/84 | HR 72 | Temp 98.1°F | Resp 16 | Ht 75.0 in | Wt 226.0 lb

## 2012-07-02 DIAGNOSIS — Z0000 Encounter for general adult medical examination without abnormal findings: Secondary | ICD-10-CM

## 2012-07-02 DIAGNOSIS — R079 Chest pain, unspecified: Secondary | ICD-10-CM

## 2012-07-02 DIAGNOSIS — R55 Syncope and collapse: Secondary | ICD-10-CM

## 2012-07-02 DIAGNOSIS — R002 Palpitations: Secondary | ICD-10-CM

## 2012-07-02 NOTE — Progress Notes (Signed)
Subjective:    Patient ID: Joshua Li, male    DOB: December 18, 1960, 52 y.o.   MRN: [id number]  Back Pain Pertinent negatives include no abdominal pain, fever or weakness.   Increased spasm and pain in back Spasm  Starting with "bending over" Lasting  Days Walking ans stretching helps Back pain constant  No films? Worker comp claim in 2013 with PT and a tend unit Episodic racing heart rate   Review of Systems  Constitutional: Negative for fever and fatigue.  HENT: Negative for hearing loss, congestion, neck pain and postnasal drip.   Eyes: Negative for discharge, redness and visual disturbance.  Respiratory: Negative for cough, shortness of breath and wheezing.   Cardiovascular: Negative for leg swelling.  Gastrointestinal: Negative for abdominal pain, constipation and abdominal distention.  Genitourinary: Negative for urgency and frequency.  Musculoskeletal: Positive for back pain. Negative for joint swelling and arthralgias.  Skin: Negative for color change and rash.  Neurological: Negative for weakness and light-headedness.  Hematological: Negative for adenopathy.  Psychiatric/Behavioral: Negative for behavioral problems.   Past Medical History  Diagnosis Date  . Hyperlipidemia   . Asthma   . GERD (gastroesophageal reflux disease)   . Ulcerative colitis   . Hiatal hernia   . Allergy     History   Social History  . Marital Status: Divorced    Spouse Name: N/A    Number of Children: N/A  . Years of Education: N/A   Occupational History  . Not on file.   Social History Main Topics  . Smoking status: Never Smoker   . Smokeless tobacco: Not on file  . Alcohol Use: Yes  . Drug Use: No  . Sexually Active: Yes   Other Topics Concern  . Not on file   Social History Narrative  . No narrative on file    Past Surgical History  Procedure Laterality Date  . Tonsillectomy    . Cholecystectomy    . Colectomy      Family History  Problem Relation Age of Onset   . Cancer Mother     breast and ovarian  . Cancer Father     stomach & bladder  . Heart disease Father     No Known Allergies  Current Outpatient Prescriptions on File Prior to Visit  Medication Sig Dispense Refill  . aspirin 81 MG tablet Take 81 mg by mouth daily.        . budesonide (PULMICORT) 180 MCG/ACT inhaler Inhale 1 puff into the lungs 2 (two) times daily as needed.      . cyclobenzaprine (FLEXERIL) 10 MG tablet Take 10 mg by mouth 3 (three) times daily as needed.      . loratadine (CLARITIN) 10 MG tablet Take 10 mg by mouth daily.      . metroNIDAZOLE (FLAGYL) 250 MG tablet TAKE 1 TABLET BY MOUTH TWICE DAILY  60 tablet  0  . omeprazole (PRILOSEC) 40 MG capsule Take 1 capsule (40 mg total) by mouth daily.  90 capsule  3  . triamcinolone (NASACORT) 55 MCG/ACT nasal inhaler Place 2 sprays into the nose daily as needed.       . Cyanocobalamin (B-12 DOTS) 500 MCG TBDP Take 1 Units by mouth 2 (two) times daily.      . meloxicam (MOBIC) 15 MG tablet Take 15 mg by mouth daily as needed.       No current facility-administered medications on file prior to visit.    BP 130/84  Pulse  72  Temp(Src) 98.1 F (36.7 C)  Resp 16  Ht 6' 3"  (1.905 m)  Wt 226 lb (102.513 kg)  BMI 28.25 kg/m2       Objective:   Physical Exam  Constitutional: He is oriented to person, place, and time. He appears well-developed and well-nourished.  HENT:  Head: Normocephalic and atraumatic.  Eyes: Conjunctivae are normal. Pupils are equal, round, and reactive to light.  Neck: Normal range of motion. Neck supple.  Cardiovascular: Normal rate and regular rhythm.   Pulmonary/Chest: Effort normal and breath sounds normal.  Abdominal: Soft. Bowel sounds are normal.  Genitourinary: Rectum normal and prostate normal.  Neurological: He is alert and oriented to person, place, and time.  Skin: Skin is warm and dry.  Psychiatric: He has a normal mood and affect. His behavior is normal.           Assessment & Plan:   Patient presents for yearly preventative medicine examination.   all immunizations and health maintenance protocols were reviewed with the patient and they are up to date with these protocols.   screening laboratory values were reviewed with the patient including screening of hyperlipidemia PSA renal function and hepatic function.   There medications past medical history social history problem list and allergies were reviewed in detail.   Goals were established with regard to weight loss exercise diet in compliance with medications  Back pain reviewed ortho consult and presumptive treatment for arthritis and spondylosis  Symptomatic palpitations Episodic 2-3 times a month Stress test?

## 2012-07-02 NOTE — Patient Instructions (Signed)
Will reveiw the back films  Core exercisesBack Exercises Back exercises help treat and prevent back injuries. The goal of back exercises is to increase the strength of your abdominal and back muscles and the flexibility of your back. These exercises should be started when you no longer have back pain. Back exercises include:  Pelvic Tilt. Lie on your back with your knees bent. Tilt your pelvis until the lower part of your back is against the floor. Hold this position 5 to 10 sec and repeat 5 to 10 times.  Knee to Chest. Pull first 1 knee up against your chest and hold for 20 to 30 seconds, repeat this with the other knee, and then both knees. This may be done with the other leg straight or bent, whichever feels better.  Sit-Ups or Curl-Ups. Bend your knees 90 degrees. Start with tilting your pelvis, and do a partial, slow sit-up, lifting your trunk only 30 to 45 degrees off the floor. Take at least 2 to 3 seconds for each sit-up. Do not do sit-ups with your knees out straight. If partial sit-ups are difficult, simply do the above but with only tightening your abdominal muscles and holding it as directed.  Hip-Lift. Lie on your back with your knees flexed 90 degrees. Push down with your feet and shoulders as you raise your hips a couple inches off the floor; hold for 10 seconds, repeat 5 to 10 times.  Back arches. Lie on your stomach, propping yourself up on bent elbows. Slowly press on your hands, causing an arch in your low back. Repeat 3 to 5 times. Any initial stiffness and discomfort should lessen with repetition over time.  Shoulder-Lifts. Lie face down with arms beside your body. Keep hips and torso pressed to floor as you slowly lift your head and shoulders off the floor. Do not overdo your exercises, especially in the beginning. Exercises may cause you some mild back discomfort which lasts for a few minutes; however, if the pain is more severe, or lasts for more than 15 minutes, do not  continue exercises until you see your caregiver. Improvement with exercise therapy for back problems is slow.  See your caregivers for assistance with developing a proper back exercise program. Document Released: 04/21/2004 Document Revised: 06/06/2011 Document Reviewed: 01/13/2011 [facility] Patient Information 2013 [place].

## 2012-07-07 ENCOUNTER — Other Ambulatory Visit: Payer: Self-pay | Admitting: Internal Medicine

## 2012-07-11 ENCOUNTER — Encounter (HOSPITAL_COMMUNITY): Payer: 59

## 2012-07-17 ENCOUNTER — Ambulatory Visit (HOSPITAL_COMMUNITY): Payer: 59 | Attending: Cardiovascular Disease | Admitting: Radiology

## 2012-07-17 VITALS — BP 124/81 | HR 46 | Ht 75.0 in | Wt 217.0 lb

## 2012-07-17 DIAGNOSIS — Z8249 Family history of ischemic heart disease and other diseases of the circulatory system: Secondary | ICD-10-CM | POA: Insufficient documentation

## 2012-07-17 DIAGNOSIS — E785 Hyperlipidemia, unspecified: Secondary | ICD-10-CM | POA: Insufficient documentation

## 2012-07-17 DIAGNOSIS — R42 Dizziness and giddiness: Secondary | ICD-10-CM | POA: Insufficient documentation

## 2012-07-17 DIAGNOSIS — R002 Palpitations: Secondary | ICD-10-CM | POA: Insufficient documentation

## 2012-07-17 DIAGNOSIS — R079 Chest pain, unspecified: Secondary | ICD-10-CM

## 2012-07-17 DIAGNOSIS — R55 Syncope and collapse: Secondary | ICD-10-CM | POA: Insufficient documentation

## 2012-07-17 MED ORDER — TECHNETIUM TC 99M SESTAMIBI GENERIC - CARDIOLITE
30.0000 | Freq: Once | INTRAVENOUS | Status: AC | PRN
  Administered 2012-07-17: 30 via INTRAVENOUS

## 2012-07-17 MED ORDER — TECHNETIUM TC 99M SESTAMIBI GENERIC - CARDIOLITE
10.0000 | Freq: Once | INTRAVENOUS | Status: AC | PRN
  Administered 2012-07-17: 10 via INTRAVENOUS

## 2012-07-17 NOTE — Progress Notes (Signed)
[facility] [address] [place], [place] [address] [phone]    Cardiology Nuclear Med Study  Joshua Li is a 52 y.o. male     MRN : [id number]     DOB: 1960-07-10  Procedure Date: 07/17/2012  Nuclear Med Background Indication for Stress Test:  Evaluation for Ischemia History:  '11 Echo:EF=65% Cardiac Risk Factors: Family History - CAD and Lipids  Symptoms:  Dizziness with Palpitations, Near Syncope, Palpitations and Rapid HR   Nuclear Pre-Procedure Caffeine/Decaff Intake:  None NPO After: 8:30pm   Lungs:  Clear. O2 Sat: 99% on room air. IV 0.9% NS with Angio Cath:  20g  IV Site: R Antecubital  IV Started by:  Perrin Maltese, EMT-P  Chest Size (in):  44 Cup Size: n/a  Height: 6' 3"  (1.905 m)  Weight:  217 lb (98.431 kg)  BMI:  Body mass index is 27.12 kg/(m^2). Tech Comments:  n/a    Nuclear Med Study 1 or 2 day study: 1 day  Stress Test Type:  Stress  Reading MD: Mertie Moores, MD  Order Authorizing Provider:  Benay Pillow, MD  Resting Radionuclide: Technetium 70mSestamibi  Resting Radionuclide Dose: 11.0 mCi   Stress Radionuclide:  Technetium 923mestamibi  Stress Radionuclide Dose: 33.0 mCi           Stress Protocol Rest HR: 46 Stress HR: 155  Rest BP: 124/81 Stress BP: 176/78  Exercise Time (min): 9:00 METS: 10.1   Predicted Max HR: 167 bpm % Max HR: 92.81 bpm Rate Pressure Product: 27280   Dose of Adenosine (mg):  n/a Dose of Lexiscan: n/a mg  Dose of Atropine (mg): n/a Dose of Dobutamine: n/a mcg/kg/min (at max HR)  Stress Test Technologist: ShLetta MoynahanCMA-N  Nuclear Technologist:  StCharlton AmorCNMT     Rest Procedure:  Myocardial perfusion imaging was performed at rest 45 minutes following the intravenous administration of Technetium 9912mstamibi.  Rest ECG: NSR - Normal EKG  Stress Procedure:  The patient exercised on the treadmill utilizing the Bruce Protocol for nine minutes minutes. The patient  stopped due to fatigue and denied any chest pain.  Technetium 54m68mtamibi was injected at peak exercise and myocardial perfusion imaging was performed after a brief delay.  Stress ECG: No significant change from baseline ECG  QPS Raw Data Images:  Normal; no motion artifact; normal heart/lung ratio. Stress Images:  Normal homogeneous uptake in all areas of the myocardium. Rest Images:  Normal homogeneous uptake in all areas of the myocardium. Subtraction (SDS):  No evidence of ischemia. Transient Ischemic Dilatation (Normal <1.22):  1.07 Lung/Heart Ratio (Normal <0.45):  0.36  Quantitative Gated Spect Images QGS EDV:  110 ml QGS ESV:  46 ml  Impression Exercise Capacity:  Good exercise capacity. BP Response:  Normal blood pressure response. Clinical Symptoms:  No significant symptoms noted. ECG Impression:  No significant ST segment change suggestive of ischemia. Comparison with Prior Nuclear Study: No previous nuclear study performed  Overall Impression:  Normal stress nuclear study.  No evidence of ischemia.  LV Ejection Fraction: 59%.  LV Wall Motion:  NL LV Function; NL Wall Motion.   PhilThayer Headings.,Brooke BonitoD, FACCCrawford Memorial Hospital2/2014, 6:04 PM Office - [phone]

## 2012-09-01 ENCOUNTER — Other Ambulatory Visit: Payer: Self-pay | Admitting: Internal Medicine

## 2012-10-05 ENCOUNTER — Ambulatory Visit (INDEPENDENT_AMBULATORY_CARE_PROVIDER_SITE_OTHER): Payer: 59 | Admitting: Internal Medicine

## 2012-10-05 ENCOUNTER — Encounter: Payer: Self-pay | Admitting: Internal Medicine

## 2012-10-05 VITALS — BP 110/80 | HR 68 | Temp 97.6°F | Wt 226.0 lb

## 2012-10-05 DIAGNOSIS — K219 Gastro-esophageal reflux disease without esophagitis: Secondary | ICD-10-CM

## 2012-10-05 DIAGNOSIS — R002 Palpitations: Secondary | ICD-10-CM

## 2012-10-05 MED ORDER — PROPRANOLOL HCL 10 MG PO TABS: 10.0000 mg | ORAL_TABLET | ORAL | Status: DC | PRN

## 2012-10-05 MED ORDER — BUDESONIDE 180 MCG/ACT IN AEPB: 1.0000 | INHALATION_SPRAY | Freq: Two times a day (BID) | RESPIRATORY_TRACT | Status: DC | PRN

## 2012-10-05 MED ORDER — OMEPRAZOLE 40 MG PO CPDR: 40.0000 mg | DELAYED_RELEASE_CAPSULE | Freq: Every day | ORAL | Status: DC

## 2012-10-05 NOTE — Patient Instructions (Addendum)
banana Papaya spinach Persistent would recommend magnesium 400 mg 1 by mouth daily Fill the propranol use this at the first signs of any sustained palpitations please let me know if this seems to extinguish the palpitations if you find that you're using this several times a week we will consider putting her on a low dose of this medication regularly he would not have to return to the office to get this prescription but could contact me through my chart

## 2012-10-05 NOTE — Progress Notes (Signed)
  Subjective:    Patient ID: Joshua Li, male    DOB: 06/21/1960, 52 y.o.   MRN: [id number]  HPI The palpitations persist with some episode of light headedness Hx of crohns Electrolytes were norml   Review of Systems     Objective:   Physical Exam        Assessment & Plan:  No charge Follow up for the results of the stress test

## 2012-10-15 ENCOUNTER — Other Ambulatory Visit: Payer: Self-pay | Admitting: *Deleted

## 2012-10-15 ENCOUNTER — Encounter: Payer: Self-pay | Admitting: Internal Medicine

## 2012-10-15 MED ORDER — PROPRANOLOL HCL 10 MG PO TABS: 10.0000 mg | ORAL_TABLET | ORAL | Status: DC | PRN

## 2012-10-18 ENCOUNTER — Other Ambulatory Visit: Payer: Self-pay | Admitting: *Deleted

## 2012-10-18 MED ORDER — PROPRANOLOL HCL 10 MG PO TABS: 10.0000 mg | ORAL_TABLET | ORAL | Status: DC | PRN

## 2012-11-14 ENCOUNTER — Encounter: Payer: Self-pay | Admitting: Family Medicine

## 2012-11-14 ENCOUNTER — Ambulatory Visit (INDEPENDENT_AMBULATORY_CARE_PROVIDER_SITE_OTHER): Payer: 59 | Admitting: Family Medicine

## 2012-11-14 VITALS — BP 132/84 | HR 64 | Temp 98.2°F | Wt 227.0 lb

## 2012-11-14 DIAGNOSIS — J309 Allergic rhinitis, unspecified: Secondary | ICD-10-CM

## 2012-11-14 MED ORDER — PREDNISONE 10 MG PO TABS: ORAL_TABLET | ORAL | Status: DC

## 2012-11-14 NOTE — Progress Notes (Signed)
  Subjective:    Patient ID: Joshua Li, male    DOB: 1960/08/31, 52 y.o.   MRN: [id number]  HPI  Patient has history of seasonal allergies, asthma, and atopic dermatitis 2-3 week history of progressive cough and some wheezing. He has had some nasal congestion and postnasal drip symptoms. Takes Claritin regularly but inconsistent use of Pulmicort and Nasacort Denies any recent fever chills Nonsmoker  Review of Systems  Constitutional: Negative for fever, chills and unexpected weight change.  HENT: Positive for congestion and postnasal drip.   Respiratory: Positive for cough and wheezing. Negative for shortness of breath.   Cardiovascular: Negative for chest pain and leg swelling.       Objective:   Physical Exam  Constitutional: He appears well-developed and well-nourished.  HENT:  Right Ear: External ear normal.  Left Ear: External ear normal.  Mouth/Throat: Oropharynx is clear and moist.  Neck: Neck supple.  Cardiovascular: Normal rate and regular rhythm.   Pulmonary/Chest: Effort normal and breath sounds normal. No respiratory distress. He has no wheezes. He has no rales.  Lymphadenopathy:    He has no cervical adenopathy.          Assessment & Plan:  Seasonal allergic rhinitis. Get back on Nasacort regularly. Increase Pulmicort to 2 puffs twice daily.  If not seeing improvement in cough and wheezing after one week with that, prednisone taper orally

## 2012-11-14 NOTE — Patient Instructions (Addendum)
Increase Pulmicort to two puffs twice daily

## 2013-01-31 ENCOUNTER — Other Ambulatory Visit: Payer: Self-pay

## 2013-05-25 ENCOUNTER — Other Ambulatory Visit: Payer: Self-pay | Admitting: Internal Medicine

## 2013-06-11 ENCOUNTER — Encounter: Payer: Self-pay | Admitting: Internal Medicine

## 2013-06-12 MED ORDER — LORATADINE 10 MG PO TABS: 10.0000 mg | ORAL_TABLET | Freq: Every day | ORAL | Status: DC

## 2013-06-26 ENCOUNTER — Other Ambulatory Visit (INDEPENDENT_AMBULATORY_CARE_PROVIDER_SITE_OTHER): Payer: 59

## 2013-06-26 DIAGNOSIS — Z0000 Encounter for general adult medical examination without abnormal findings: Secondary | ICD-10-CM

## 2013-06-26 LAB — HEPATIC FUNCTION PANEL
ALK PHOS: 50 U/L (ref 39–117)
ALT: 30 U/L (ref 0–53)
AST: 19 U/L (ref 0–37)
Albumin: 4.2 g/dL (ref 3.5–5.2)
BILIRUBIN DIRECT: 0.1 mg/dL (ref 0.0–0.3)
BILIRUBIN TOTAL: 0.8 mg/dL (ref 0.3–1.2)
TOTAL PROTEIN: 6.9 g/dL (ref 6.0–8.3)

## 2013-06-26 LAB — BASIC METABOLIC PANEL
BUN: 16 mg/dL (ref 6–23)
CALCIUM: 9.6 mg/dL (ref 8.4–10.5)
CO2: 26 meq/L (ref 19–32)
CREATININE: 1.1 mg/dL (ref 0.4–1.5)
Chloride: 106 mEq/L (ref 96–112)
GFR: 75.58 mL/min (ref >60.00)
Glucose, Bld: 100 mg/dL — ABNORMAL HIGH (ref 70–99)
Potassium: 3.9 mEq/L (ref 3.5–5.1)
Sodium: 140 mEq/L (ref 135–145)

## 2013-06-26 LAB — CBC WITH DIFFERENTIAL/PLATELET
BASOS ABS: 0 10*3/uL (ref 0.0–0.1)
BASOS PCT: 0.4 % (ref 0.0–3.0)
EOS ABS: 0.1 10*3/uL (ref 0.0–0.7)
Eosinophils Relative: 2 % (ref 0.0–5.0)
HCT: 47.7 % (ref 39.0–52.0)
Hemoglobin: 16.1 g/dL (ref 13.0–17.0)
LYMPHS PCT: 31.1 % (ref 12.0–46.0)
Lymphs Abs: 2.2 10*3/uL (ref 0.7–4.0)
MCHC: 33.8 g/dL (ref 30.0–36.0)
MCV: 90.9 fl (ref 78.0–100.0)
Monocytes Absolute: 0.5 10*3/uL (ref 0.1–1.0)
Monocytes Relative: 7.1 % (ref 3.0–12.0)
NEUTROS PCT: 59.4 % (ref 43.0–77.0)
Neutro Abs: 4.2 10*3/uL (ref 1.4–7.7)
Platelets: 239 10*3/uL (ref 150.0–400.0)
RBC: 5.24 Mil/uL (ref 4.22–5.81)
RDW: 13.2 % (ref 11.5–14.6)
WBC: 7 10*3/uL (ref 4.5–10.5)

## 2013-06-26 LAB — LIPID PANEL
CHOLESTEROL: 200 mg/dL (ref 0–200)
HDL: 49.3 mg/dL (ref >39.00)
LDL Cholesterol: 132 mg/dL — ABNORMAL HIGH (ref 0–99)
Total CHOL/HDL Ratio: 4
Triglycerides: 94 mg/dL (ref 0.0–149.0)
VLDL: 18.8 mg/dL (ref 0.0–40.0)

## 2013-06-26 LAB — POCT URINALYSIS DIPSTICK
BILIRUBIN UA: NEGATIVE
GLUCOSE UA: NEGATIVE
KETONES UA: NEGATIVE
LEUKOCYTES UA: NEGATIVE
Nitrite, UA: NEGATIVE
Protein, UA: NEGATIVE
Spec Grav, UA: 1.025
Urobilinogen, UA: 0.2
pH, UA: 5.5

## 2013-06-26 LAB — TSH: TSH: 1.65 u[IU]/mL (ref 0.35–5.50)

## 2013-06-26 LAB — PSA: PSA: 0.51 ng/mL (ref 0.10–4.00)

## 2013-07-08 ENCOUNTER — Encounter: Payer: 59 | Admitting: Internal Medicine

## 2013-07-10 ENCOUNTER — Encounter: Payer: 59 | Admitting: Internal Medicine

## 2013-08-13 ENCOUNTER — Encounter: Payer: Self-pay | Admitting: Internal Medicine

## 2013-08-27 ENCOUNTER — Other Ambulatory Visit: Payer: Self-pay | Admitting: Internal Medicine

## 2013-08-28 ENCOUNTER — Telehealth: Payer: Self-pay | Admitting: Internal Medicine

## 2013-08-28 MED ORDER — METRONIDAZOLE 250 MG PO TABS: ORAL_TABLET | ORAL | Status: DC

## 2013-08-28 NOTE — Telephone Encounter (Signed)
rx sent in electronically 

## 2013-08-28 NOTE — Telephone Encounter (Signed)
[facility] is requesting re-fill on metroNIDAZOLE (FLAGYL) 250 MG tablet

## 2014-01-10 ENCOUNTER — Other Ambulatory Visit: Payer: Self-pay

## 2014-03-03 ENCOUNTER — Telehealth: Payer: Self-pay

## 2014-03-03 NOTE — Telephone Encounter (Signed)
Pt needs an OV to establish with a new PCP.  Please call to schedule

## 2014-03-03 NOTE — Telephone Encounter (Signed)
Rx request for omeprazole 40 mg- Take 1 capsule by mouth daily #90  Pharm:  [facility]

## 2014-03-04 NOTE — Telephone Encounter (Signed)
I spoke to patient and he is going to call [place] and [place] to see their availability.  They are closer.

## 2014-03-06 ENCOUNTER — Other Ambulatory Visit: Payer: Self-pay | Admitting: Family Medicine

## 2014-03-29 ENCOUNTER — Ambulatory Visit (INDEPENDENT_AMBULATORY_CARE_PROVIDER_SITE_OTHER): Payer: 59 | Admitting: Family Medicine

## 2014-03-29 ENCOUNTER — Encounter: Payer: Self-pay | Admitting: Family Medicine

## 2014-03-29 ENCOUNTER — Ambulatory Visit (HOSPITAL_COMMUNITY)
Admission: RE | Admit: 2014-03-29 | Discharge: 2014-03-29 | Disposition: A | Discharge status: Home / Self Care | Payer: 59 | Source: Ambulatory Visit | Attending: Family Medicine | Admitting: Family Medicine

## 2014-03-29 ENCOUNTER — Telehealth: Payer: Self-pay | Admitting: Internal Medicine

## 2014-03-29 VITALS — BP 120/80 | HR 62 | Temp 99.5°F | Ht 75.0 in | Wt 218.8 lb

## 2014-03-29 DIAGNOSIS — R509 Fever, unspecified: Secondary | ICD-10-CM | POA: Diagnosis not present

## 2014-03-29 DIAGNOSIS — R933 Abnormal findings on diagnostic imaging of other parts of digestive tract: Secondary | ICD-10-CM | POA: Insufficient documentation

## 2014-03-29 DIAGNOSIS — R1030 Lower abdominal pain, unspecified: Secondary | ICD-10-CM | POA: Diagnosis not present

## 2014-03-29 DIAGNOSIS — K91850 Pouchitis: Secondary | ICD-10-CM

## 2014-03-29 LAB — CBC WITH DIFFERENTIAL/PLATELET
Basophils Absolute: 0 10*3/uL (ref 0.0–0.1)
Basophils Relative: 0 % (ref 0–1)
EOS ABS: 0 10*3/uL (ref 0.0–0.7)
EOS PCT: 0 % (ref 0–5)
HEMATOCRIT: 49.5 % (ref 39.0–52.0)
Hemoglobin: 18.1 g/dL — ABNORMAL HIGH (ref 13.0–17.0)
LYMPHS ABS: 1.9 10*3/uL (ref 0.7–4.0)
Lymphocytes Relative: 11 % — ABNORMAL LOW (ref 12–46)
MCH: 31.4 pg (ref 26.0–34.0)
MCHC: 36.6 g/dL — ABNORMAL HIGH (ref 30.0–36.0)
MCV: 85.9 fL (ref 78.0–100.0)
MONOS PCT: 10 % (ref 3–12)
Monocytes Absolute: 1.8 10*3/uL — ABNORMAL HIGH (ref 0.1–1.0)
NEUTROS PCT: 78 % — AB (ref 43–77)
Neutro Abs: 13.4 10*3/uL — ABNORMAL HIGH (ref 1.7–7.7)
Platelets: 207 10*3/uL (ref 150–400)
RBC: 5.76 MIL/uL (ref 4.22–5.81)
RDW: 12.7 % (ref 11.5–15.5)
WBC: 17.2 10*3/uL — ABNORMAL HIGH (ref 4.0–10.5)

## 2014-03-29 MED ORDER — HYDROCODONE-ACETAMINOPHEN 5-325 MG PO TABS: 1.0000 | ORAL_TABLET | Freq: Four times a day (QID) | ORAL | Status: DC | PRN

## 2014-03-29 MED ORDER — CIPROFLOXACIN HCL 500 MG PO TABS: 500.0000 mg | ORAL_TABLET | Freq: Two times a day (BID) | ORAL | Status: DC

## 2014-03-29 NOTE — Progress Notes (Signed)
Pre visit review using our clinic review tool, if applicable. No additional management support is needed unless otherwise documented below in the visit note. 

## 2014-03-29 NOTE — Progress Notes (Signed)
OFFICE VISIT  03/29/2014   CC:  Chief Complaint  Patient presents with  . Abdominal Pain    X 2 DAYS (CONSTANT) Had a fever of 102.1 last night. Had colostomy in 2003 & had a J Pouch. Similar problem has happened once before.   HPI:    Patient is a 54 y.o. Caucasian male with hx of ulcerative colitis s/p colectomy in 2003 (see PMH/PSH) who presents for about 48h of constant middle and lower abd pain with some brief periods of acute worsening, also difficulty passing a BM: having to bear down to have BM and he usually doesn't have to do this since having colon surgery 2003.  No n/v.  Adjusting eating habits to avoid problems.  Temp 102.1 last night.  Stool consistency watery, and usually it is like peanut butter consistency.  No blood or pus in it.  Urine: no sx's, just darker than normal.    Applied heat to stomach, switched to liquid diet for now.  He started flagyl 250 bid since this started 2 d/a--he has this at home on hand for possible pouchitis dx in past (once 5-6 years ago). Sx's no better and no worse since onset.  Denies rectal/anal pain.  ROS: no HA, no ST, no SOB, no cough, no CP, no wheezing, no palpitations, no dizziness.  Past Medical History  Diagnosis Date  . Hyperlipidemia   . Asthma   . GERD (gastroesophageal reflux disease)   . Ulcerative colitis   . Hiatal hernia   . Allergy     Past Surgical History  Procedure Laterality Date  . Tonsillectomy    . Cholecystectomy    . Colectomy  2003    with one step take-down to J-pouch    Outpatient Prescriptions Prior to Visit  Medication Sig Dispense Refill  . aspirin 81 MG tablet Take 81 mg by mouth daily.      Marland Kitchen loratadine (CLARITIN) 10 MG tablet Take 1 tablet (10 mg total) by mouth daily. 90 tablet 3  . omeprazole (PRILOSEC) 40 MG capsule Take 1 capsule by mouth  daily 90 capsule 1  . propranolol (INDERAL) 10 MG tablet Take 1 tablet (10 mg total) by mouth as needed. Take as needed for palpitations not to exceed 3  times a day .do not use if lightheaded 30 tablet prn  . triamcinolone (NASACORT) 55 MCG/ACT nasal inhaler Place 2 sprays into the nose daily as needed.     . budesonide (PULMICORT) 180 MCG/ACT inhaler Inhale 1 puff into the lungs 2 (two) times daily as needed.    . metroNIDAZOLE (FLAGYL) 250 MG tablet TAKE 1 TABLET BY MOUTH TWICE DAILY NEEDS OV (Patient not taking: Reported on 03/29/2014) 180 tablet 0  . predniSONE (DELTASONE) 10 MG tablet Taper as follows: 4-4-4-4-3-3-2-2-1-1 28 tablet 0   No facility-administered medications prior to visit.    No Known Allergies  ROS As per HPI  PE: Blood pressure 120/80, pulse 62, temperature 99.5 F (37.5 C), temperature source Oral, height 6' 3"  (1.905 m), weight 218 lb 12 oz (99.224 kg), SpO2 95 %. Gen: Alert, well appearing.  Patient is oriented to person, place, time, and situation. ULA:GTXM: no injection, icteris, swelling, or exudate.  EOMI, PERRLA. Mouth: lips without lesion/swelling.  Oral mucosa pink and moist. Oropharynx without erythema, exudate, or swelling.  CV: RRR, no m/r/g.   LUNGS: CTA bilat, nonlabored resps, good aeration in all lung fields. ABD: soft, nondistended.  BS normal-to-hyperactive but no high pitched tinkling or rushes.  He has mild/mod TTP in mid/lower abdomen diffusely.  No guarding, ?mild suggestion of rebound tenderness.  No bruit or mass.  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  He looks good in office today, low suspicion of acute abdomen despite question of rebound tenderness on today's exam. Cipro 500 mg bid x 10d. vicodin 5/325, 1-2 q6h prn, #30. Will check CBC w/diff and acute abd series today.  An After Visit Summary was printed and given to the patient.  FOLLOW UP: Return for 3-5 days if not improving any on meds.

## 2014-03-31 NOTE — Telephone Encounter (Signed)
PLEASE NOTE: All timestamps contained within this report are represented as [place] Standard Time. CONFIDENTIALTY NOTICE: This fax transmission is intended only for the addressee. It contains information that is legally privileged, confidential or otherwise protected from use or disclosure. If you are not the intended recipient, you are strictly prohibited from reviewing, disclosing, copying using or disseminating any of this information or taking any action in reliance on or regarding this information. If you have received this fax in error, please notify us immediately by telephone so that we can arrange for its return to [place]. Phone: [phone], Toll-Free: [phone], Fax: [phone] Page: 1 of 2 Call Id: [id number] [facility] Night - Client [facility] Patient Name: Joshua Li Gender: Male DOB: 09-Mar-1961 Age: 54 Y 3 M 13 D Return Phone Number: [phone] (Primary) Address: [address] City/State/Zip: [place] [address] Client [facility] Night - Client Client Site [facility] - Night Contact Type Call Call Type Triage / Clinical Relationship To Patient Self Return Phone Number [phone] (Primary) Chief Complaint Abdominal Pain Initial Comment Caller states he has some abdominal cramping today along with a fever of 102. PreDisposition Call another nurse Nurse Assessment Nurse: Harlow Mares, RN, Suanne Marker Date/Time Eilene Ghazi Time): 03/29/2014 10:42:22 AM Confirm and document reason for call. If symptomatic, describe symptoms. ---Caller states he has some abdominal cramping today along with a fever of 102, last evening. This morning fever is 99.1. Adb cramping is below the belly button, rate=6. Reports that he has had ulcerative with colectomy with a J-pouch. Has 7-10 stools per day, has only had fluids, no solid food in several days. Reports that he also has a peptic ulcer  and periodically he will have cramping and limited his food. Abdominal pain has been constant for the past 48hrs. Denies blood in stool. Reports plenty of urinary output. Has the patient traveled out of the country within the last 30 days? ---No Does the patient require triage? ---Yes Related visit to physician within the last 2 weeks? ---No Does the PT have any chronic conditions? (i.e. diabetes, asthma, etc.) ---Yes List chronic conditions. ---hx colectomy (ulcerative colitis); Guidelines Guideline Title Affirmed Question Affirmed Notes Nurse Date/Time Eilene Ghazi Time) Abdominal Pain - Male [1] MILD-MODERATE pain AND [2] constant AND [3] present > 2 hours Harlow Mares, RN, Suanne Marker 03/29/2014 10:48:30 AM Disp. Time Eilene Ghazi Time) Disposition Final User 03/29/2014 10:52:13 AM See Physician within 4 Hours (or PCP triage) Yes Harlow Mares, RN, Suanne Marker PLEASE NOTE: All timestamps contained within this report are represented as [place] Standard Time. CONFIDENTIALTY NOTICE: This fax transmission is intended only for the addressee. It contains information that is legally privileged, confidential or otherwise protected from use or disclosure. If you are not the intended recipient, you are strictly prohibited from reviewing, disclosing, copying using or disseminating any of this information or taking any action in reliance on or regarding this information. If you have received this fax in error, please notify us immediately by telephone so that we can arrange for its return to [place]. Phone: [phone], Toll-Free: [phone], Fax: [phone] Page: 2 of 2 Call Id: [id number] Caller Understands: Yes Disagree/Comply: Comply Care Advice Given Per Guideline [phone], Saturday morning at 9:00am for appointment SEE PHYSICIAN WITHIN 4 HOURS (or PCP triage): * IF NO PCP TRIAGE: You need to be seen. Go to _______________ (ED/UCC or office if it will be open) within the next 3 or 4 hours. Go sooner if you become  worse. NOTHING  BY MOUTH: Do not eat or drink anything for now. REST: Lie down and rest until seen. CALL BACK IF: * You become worse. CARE ADVICE given per Abdominal Pain, Male (Adult) guideline. After Care Instructions Given Call Event Type User Date / Time Description Referrals [facility]

## 2014-05-17 ENCOUNTER — Ambulatory Visit (INDEPENDENT_AMBULATORY_CARE_PROVIDER_SITE_OTHER): Payer: 59 | Admitting: Family

## 2014-05-17 ENCOUNTER — Encounter: Payer: Self-pay | Admitting: Family

## 2014-05-17 VITALS — BP 128/80 | HR 68 | Temp 98.2°F | Ht 75.0 in | Wt 223.5 lb

## 2014-05-17 DIAGNOSIS — J209 Acute bronchitis, unspecified: Secondary | ICD-10-CM | POA: Insufficient documentation

## 2014-05-17 MED ORDER — LORATADINE 10 MG PO TABS
10.0000 mg | ORAL_TABLET | Freq: Every day | ORAL | Status: DC
Start: 2014-05-17 — End: 2014-12-23

## 2014-05-17 MED ORDER — TRIAMCINOLONE ACETONIDE 55 MCG/ACT NA AERO
INHALATION_SPRAY | NASAL | Status: DC
Start: 2014-05-17 — End: 2014-12-23

## 2014-05-17 MED ORDER — AZITHROMYCIN 250 MG PO TABS: ORAL_TABLET | ORAL | Status: DC

## 2014-05-17 NOTE — Patient Instructions (Addendum)
Start z-pak, claritin, nasacort. Call if symptoms worsen or if they do not improve. Try to follow reflux diet as below.    Food Choices for Gastroesophageal Reflux Disease When you have gastroesophageal reflux disease (GERD), the foods you eat and your eating habits are very important. Choosing the right foods can help ease the discomfort of GERD. WHAT GENERAL GUIDELINES DO I NEED TO FOLLOW?  Choose fruits, vegetables, whole grains, low-fat dairy products, and low-fat meat, fish, and poultry.  Limit fats such as oils, salad dressings, butter, nuts, and avocado.  Keep a food diary to identify foods that cause symptoms.  Avoid foods that cause reflux. These may be different for different people.  Eat frequent small meals instead of three large meals each day.  Eat your meals slowly, in a relaxed setting.  Limit fried foods.  Cook foods using methods other than frying.  Avoid drinking alcohol.  Avoid drinking large amounts of liquids with your meals.  Avoid bending over or lying down until 2-3 hours after eating. WHAT FOODS ARE NOT RECOMMENDED? The following are some foods and drinks that may worsen your symptoms: Vegetables Tomatoes. Tomato juice. Tomato and spaghetti sauce. Chili peppers. Onion and garlic. Horseradish. Fruits Oranges, grapefruit, and lemon (fruit and juice). Meats High-fat meats, fish, and poultry. This includes hot dogs, ribs, ham, sausage, salami, and bacon. Dairy Whole milk and chocolate milk. Sour cream. Cream. Butter. Ice cream. Cream cheese.  Beverages Coffee and tea, with or without caffeine. Carbonated beverages or energy drinks. Condiments Hot sauce. Barbecue sauce.  Sweets/Desserts Chocolate and cocoa. Donuts. Peppermint and spearmint. Fats and Oils High-fat foods, including [place] fries and potato chips. Other Vinegar. Strong spices, such as black pepper, white pepper, red pepper, cayenne, curry powder, cloves, ginger, and chili powder. The  items listed above may not be a complete list of foods and beverages to avoid. Contact your dietitian for more information. Document Released: 03/14/2005 Document Revised: 03/19/2013 Document Reviewed: 01/16/2013 [facility] Patient Information 2015 [place], [place]. This information is not intended to replace advice given to you by your health care provider. Make sure you discuss any questions you have with your health care provider.

## 2014-05-17 NOTE — Assessment & Plan Note (Signed)
Given duration of symptoms, will rx with zpak.  I have also advised pt to resume claritin and nasanex. He is instructed to call if symptoms worsen or if symptoms do not improve. Pt verbalizes understanding.

## 2014-05-17 NOTE — Progress Notes (Signed)
Subjective:    Patient ID: Joshua Li, male    DOB: Sep 16, 1960, 54 y.o.   MRN: [id number]  HPI  Patient here today to discuss 3 week history of cough, nasal congestion/drainage, sore throat. Cough as been mildly productive x 1 day. Denies  associated fever.    Now has developed hoarseness.  He has tried otc guaifenesin, dayquil/nyquil without much improvement.  Denies significant sinus pressure in his cheeks.  Has not been using nasocort.  He has not been taking claritin.    Continues PPI for GERD, wants more info re: GERD diet.   Reports that abdominal pain that he was seen for back in January resolved with antibiotics.     Past Medical History  Diagnosis Date  . Hyperlipidemia   . Asthma   . GERD (gastroesophageal reflux disease)   . Ulcerative colitis   . Hiatal hernia   . Allergy     Review of Systems    see HPI  Past Medical History  Diagnosis Date  . Hyperlipidemia   . Asthma   . GERD (gastroesophageal reflux disease)   . Ulcerative colitis   . Hiatal hernia   . Allergy     History   Social History  . Marital Status: Divorced    Spouse Name: N/A  . Number of Children: N/A  . Years of Education: N/A   Occupational History  . Not on file.   Social History Main Topics  . Smoking status: Never Smoker   . Smokeless tobacco: Not on file  . Alcohol Use: Yes  . Drug Use: No  . Sexual Activity: Yes   Other Topics Concern  . Not on file   Social History Narrative    Past Surgical History  Procedure Laterality Date  . Tonsillectomy    . Cholecystectomy    . Colectomy  2003    with one step take-down to J-pouch    Family History  Problem Relation Age of Onset  . Cancer Mother     breast and ovarian  . Cancer Father     stomach & bladder  . Heart disease Father     No Known Allergies  Current Outpatient Prescriptions on File Prior to Visit  Medication Sig Dispense Refill  . loratadine (CLARITIN) 10 MG tablet Take 1 tablet (10 mg total)  by mouth daily. 90 tablet 3  . omeprazole (PRILOSEC) 40 MG capsule Take 1 capsule by mouth  daily 90 capsule 1  . propranolol (INDERAL) 10 MG tablet Take 1 tablet (10 mg total) by mouth as needed. Take as needed for palpitations not to exceed 3 times a day .do not use if lightheaded 30 tablet prn  . aspirin 81 MG tablet Take 81 mg by mouth daily.      . budesonide (PULMICORT) 180 MCG/ACT inhaler Inhale 1 puff into the lungs 2 (two) times daily as needed.    . triamcinolone (NASACORT) 55 MCG/ACT nasal inhaler Place 2 sprays into the nose daily as needed.      No current facility-administered medications on file prior to visit.    BP 128/80 mmHg  Pulse 68  Temp(Src) 98.2 F (36.8 C) (Oral)  Ht 6' 3"  (1.905 m)  Wt 223 lb 8 oz (101.379 kg)  BMI 27.94 kg/m2  SpO2 96%    Objective:    Physical Exam  Constitutional: He is oriented to person, place, and time. He appears well-developed and well-nourished. No distress.  HENT:  Head: Normocephalic and atraumatic.  Right Ear: Tympanic membrane and ear canal normal.  Left Ear: Tympanic membrane and ear canal normal.  Mouth/Throat: No oropharyngeal exudate, posterior oropharyngeal edema or posterior oropharyngeal erythema.  Cardiovascular: Normal rate and regular rhythm.   No murmur heard. Pulmonary/Chest: Effort normal and breath sounds normal. No respiratory distress. He has no wheezes. He has no rales.  Musculoskeletal: He exhibits no edema.  Lymphadenopathy:    He has no cervical adenopathy.  Neurological: He is alert and oriented to person, place, and time.  Skin: Skin is warm and dry.  Psychiatric: He has a normal mood and affect. His behavior is normal. Thought content normal.    BP 128/80 mmHg  Pulse 68  Temp(Src) 98.2 F (36.8 C) (Oral)  Ht 6' 3"  (1.905 m)  Wt 223 lb 8 oz (101.379 kg)  BMI 27.94 kg/m2  SpO2 96% Wt Readings from Last 3 Encounters:  05/17/14 223 lb 8 oz (101.379 kg)  03/29/14 218 lb 12 oz (99.224 kg)    11/14/12 227 lb (102.967 kg)         Assessment & Plan:

## 2014-05-17 NOTE — Progress Notes (Signed)
Pre visit review using our clinic review tool, if applicable. No additional management support is needed unless otherwise documented below in the visit note. 

## 2014-12-23 ENCOUNTER — Ambulatory Visit (INDEPENDENT_AMBULATORY_CARE_PROVIDER_SITE_OTHER): Payer: 59 | Admitting: Family

## 2014-12-23 ENCOUNTER — Encounter: Payer: Self-pay | Admitting: Family

## 2014-12-23 VITALS — BP 128/72 | HR 52 | Temp 98.4°F | Resp 18 | Ht 75.0 in | Wt 212.0 lb

## 2014-12-23 DIAGNOSIS — Z0001 Encounter for general adult medical examination with abnormal findings: Secondary | ICD-10-CM | POA: Insufficient documentation

## 2014-12-23 DIAGNOSIS — Z0000 Encounter for general adult medical examination without abnormal findings: Secondary | ICD-10-CM

## 2014-12-23 MED ORDER — PROPRANOLOL HCL 10 MG PO TABS: 10.0000 mg | ORAL_TABLET | ORAL | Status: DC | PRN

## 2014-12-23 MED ORDER — BUDESONIDE 180 MCG/ACT IN AEPB: 2.0000 | INHALATION_SPRAY | Freq: Two times a day (BID) | RESPIRATORY_TRACT | Status: DC

## 2014-12-23 MED ORDER — OMEPRAZOLE 40 MG PO CPDR: DELAYED_RELEASE_CAPSULE | ORAL | Status: DC

## 2014-12-23 MED ORDER — LORATADINE 10 MG PO TABS: 10.0000 mg | ORAL_TABLET | Freq: Every day | ORAL | Status: DC

## 2014-12-23 MED ORDER — TRIAMCINOLONE ACETONIDE 55 MCG/ACT NA AERO: INHALATION_SPRAY | NASAL | Status: DC

## 2014-12-23 NOTE — Patient Instructions (Addendum)
Thank you for choosing [facility].  Summary/Instructions:  2D Echocardiogram for heart   Spirometry for asthma  Your prescription(s) have been submitted to your pharmacy or been printed and provided for you. Please take as directed and contact our office if you believe you are having problem(s) with the medication(s) or have any questions.  Please stop by the lab on the basement level of the building for your blood work. Your results will be released to [facility] (or called to you) after review, usually within 72 hours after test completion. If any changes need to be made, you will be notified at that same time.  Health Maintenance A healthy lifestyle and preventative care can promote health and wellness.  Maintain regular health, dental, and eye exams.  Eat a healthy diet. Foods like vegetables, fruits, whole grains, low-fat dairy products, and lean protein foods contain the nutrients you need and are low in calories. Decrease your intake of foods high in solid fats, added sugars, and salt. Get information about a proper diet from your health care provider, if necessary.  Regular physical exercise is one of the most important things you can do for your health. Most adults should get at least 150 minutes of moderate-intensity exercise (any activity that increases your heart rate and causes you to sweat) each week. In addition, most adults need muscle-strengthening exercises on 2 or more days a week.   Maintain a healthy weight. The body mass index (BMI) is a screening tool to identify possible weight problems. It provides an estimate of body fat based on height and weight. Your health care provider can find your BMI and can help you achieve or maintain a healthy weight. For males 20 years and older:  A BMI below 18.5 is considered underweight.  A BMI of 18.5 to 24.9 is normal.  A BMI of 25 to 29.9 is considered overweight.  A BMI of 30 and above is considered obese.  Maintain  normal blood lipids and cholesterol by exercising and minimizing your intake of saturated fat. Eat a balanced diet with plenty of fruits and vegetables. Blood tests for lipids and cholesterol should begin at age 26 and be repeated every 5 years. If your lipid or cholesterol levels are high, you are over age 73, or you are at high risk for heart disease, you may need your cholesterol levels checked more frequently.Ongoing high lipid and cholesterol levels should be treated with medicines if diet and exercise are not working.  If you smoke, find out from your health care provider how to quit. If you do not use tobacco, do not start.  Lung cancer screening is recommended for adults aged 42-80 years who are at high risk for developing lung cancer because of a history of smoking. A yearly low-dose CT scan of the lungs is recommended for people who have at least a 30-pack-year history of smoking and are current smokers or have quit within the past 15 years. A pack year of smoking is smoking an average of 1 pack of cigarettes a day for 1 year (for example, a 30-pack-year history of smoking could mean smoking 1 pack a day for 30 years or 2 packs a day for 15 years). Yearly screening should continue until the smoker has stopped smoking for at least 15 years. Yearly screening should be stopped for people who develop a health problem that would prevent them from having lung cancer treatment.  If you choose to drink alcohol, do not have more than 2  drinks per day. One drink is considered to be 12 oz (360 mL) of beer, 5 oz (150 mL) of wine, or 1.5 oz (45 mL) of liquor.  Avoid the use of street drugs. Do not share needles with anyone. Ask for help if you need support or instructions about stopping the use of drugs.  High blood pressure causes heart disease and increases the risk of stroke. Blood pressure should be checked at least every 1-2 years. Ongoing high blood pressure should be treated with medicines if weight  loss and exercise are not effective.  If you are 22-32 years old, ask your health care provider if you should take aspirin to prevent heart disease.  Diabetes screening involves taking a blood sample to check your fasting blood sugar level. This should be done once every 3 years after age 67 if you are at a normal weight and without risk factors for diabetes. Testing should be considered at a younger age or be carried out more frequently if you are overweight and have at least 1 risk factor for diabetes.  Colorectal cancer can be detected and often prevented. Most routine colorectal cancer screening begins at the age of 70 and continues through age 59. However, your health care provider may recommend screening at an earlier age if you have risk factors for colon cancer. On a yearly basis, your health care provider may provide home test kits to check for hidden blood in the stool. A small camera at the end of a tube may be used to directly examine the colon (sigmoidoscopy or colonoscopy) to detect the earliest forms of colorectal cancer. Talk to your health care provider about this at age 3 when routine screening begins. A direct exam of the colon should be repeated every 5-10 years through age 76, unless early forms of precancerous polyps or small growths are found.  People who are at an increased risk for hepatitis B should be screened for this virus. You are considered at high risk for hepatitis B if:  You were born in a country where hepatitis B occurs often. Talk with your health care provider about which countries are considered high risk.  Your parents were born in a high-risk country and you have not received a shot to protect against hepatitis B (hepatitis B vaccine).  You have HIV or AIDS.  You use needles to inject street drugs.  You live with, or have sex with, someone who has hepatitis B.  You are a man who has sex with other men (MSM).  You get hemodialysis treatment.  You take  certain medicines for conditions like cancer, organ transplantation, and autoimmune conditions.  Hepatitis C blood testing is recommended for all people born from [birth year] through 1967 and any individual with known risk factors for hepatitis C.  Healthy men should no longer receive prostate-specific antigen (PSA) blood tests as part of routine cancer screening. Talk to your health care provider about prostate cancer screening.  Testicular cancer screening is not recommended for adolescents or adult males who have no symptoms. Screening includes self-exam, a health care provider exam, and other screening tests. Consult with your health care provider about any symptoms you have or any concerns you have about testicular cancer.  Practice safe sex. Use condoms and avoid high-risk sexual practices to reduce the spread of sexually transmitted infections (STIs).  You should be screened for STIs, including gonorrhea and chlamydia if:  You are sexually active and are younger than 24 years.  You are  older than 24 years, and your health care provider tells you that you are at risk for this type of infection.  Your sexual activity has changed since you were last screened, and you are at an increased risk for chlamydia or gonorrhea. Ask your health care provider if you are at risk.  If you are at risk of being infected with HIV, it is recommended that you take a prescription medicine daily to prevent HIV infection. This is called pre-exposure prophylaxis (PrEP). You are considered at risk if:  You are a man who has sex with other men (MSM).  You are a heterosexual man who is sexually active with multiple partners.  You take drugs by injection.  You are sexually active with a partner who has HIV.  Talk with your health care provider about whether you are at high risk of being infected with HIV. If you choose to begin PrEP, you should first be tested for HIV. You should then be tested every 3 months for as  long as you are taking PrEP.  Use sunscreen. Apply sunscreen liberally and repeatedly throughout the day. You should seek shade when your shadow is shorter than you. Protect yourself by wearing long sleeves, pants, a wide-brimmed hat, and sunglasses year round whenever you are outdoors.  Tell your health care provider of new moles or changes in moles, especially if there is a change in shape or color. Also, tell your health care provider if a mole is larger than the size of a pencil eraser.  A one-time screening for abdominal aortic aneurysm (AAA) and surgical repair of large AAAs by ultrasound is recommended for men aged 61-75 years who are current or former smokers.  Stay current with your vaccines (immunizations). Document Released: 09/10/2007 Document Revised: 03/19/2013 Document Reviewed: 08/09/2010 [facility] Patient Information 2015 [place], [place]. This information is not intended to replace advice given to you by your health care provider. Make sure you discuss any questions you have with your health care provider.

## 2014-12-23 NOTE — Progress Notes (Signed)
Subjective:    Patient ID: Joshua Li, male    DOB: 1960-08-01, 54 y.o.   MRN: [id number]  Chief Complaint  Patient presents with  . Establish Care    CPE, needs refill of medications, not fasting    HPI:  Joshua Li is a 54 y.o. male who presents today for an annual wellness visit.   1) Health Maintenance -   Diet - Averages about 3 meals per day consisting of eggs, sandwiches, occasional fruits and vegetables, chicken, beef and pork; Caffiene of about 2-3 cups per day  Exercise - No structured exercise   2) Preventative Exams / Immunizations:  Dental -- Up to date  Vision -- Up to date   Health Maintenance  Topic Date Due  . Hepatitis C Screening  February 12, 1959  . HIV Screening  12/15/1973  . COLONOSCOPY  12/15/2008  . INFLUENZA VACCINE  10/27/2014  . TETANUS/TDAP  06/28/2021    Immunization History  Administered Date(s) Administered  . Influenza Split 12/26/2013  . Influenza Whole 01/22/2007, 03/09/2009  . Tdap 06/29/2011     Review of Systems  Constitutional: Denies fever, chills, fatigue, or significant weight gain/loss. HENT: Head: Denies headache or neck pain Ears: Denies changes in hearing, ringing in ears, earache, drainage Nose: Denies discharge, stuffiness, itching, nosebleed, sinus pain Throat: Denies sore throat, hoarseness, dry mouth, sores, thrush Eyes: Denies loss/changes in vision, pain, redness, blurry/double vision, flashing lights Cardiovascular: Denies chest pain/discomfort, tightness, palpitations, shortness of breath with activity, difficulty lying down, swelling, sudden awakening with shortness of breath Respiratory: Denies shortness of breath, cough, sputum production, wheezing Gastrointestinal: Denies dysphasia, heartburn, change in appetite, nausea, change in bowel habits, rectal bleeding, constipation, diarrhea, yellow skin or eyes Genitourinary: Denies frequency, urgency, burning/pain, blood in urine, incontinence, change in  urinary strength. Musculoskeletal: Denies muscle/joint pain, stiffness, back pain, redness or swelling of joints, trauma Skin: Denies rashes, lumps, itching, dryness, color changes, or hair/nail changes Neurological: Denies dizziness, fainting, seizures, weakness, numbness, tingling, tremor Psychiatric - Denies nervousness, stress, depression or memory loss Endocrine: Denies heat or cold intolerance, sweating, frequent urination, excessive thirst, changes in appetite Hematologic: Denies ease of bruising or bleeding     Objective:    BP 128/72 mmHg  Pulse 52  Temp(Src) 98.4 F (36.9 C) (Oral)  Resp 18  Ht 6' 3"  (1.905 m)  Wt 212 lb (96.163 kg)  BMI 26.50 kg/m2  SpO2 96% Nursing note and vital signs reviewed.  Depression screen PHQ 2/9 12/23/2014  Decreased Interest 0  Down, Depressed, Hopeless 0  PHQ - 2 Score 0    Physical Exam  Constitutional: He is oriented to person, place, and time. He appears well-developed and well-nourished.  HENT:  Head: Normocephalic.  Right Ear: Hearing, tympanic membrane, external ear and ear canal normal.  Left Ear: Hearing, tympanic membrane, external ear and ear canal normal.  Nose: Nose normal.  Mouth/Throat: Uvula is midline, oropharynx is clear and moist and mucous membranes are normal.  Eyes: Conjunctivae and EOM are normal. Pupils are equal, round, and reactive to light.  Neck: Neck supple. No JVD present. No tracheal deviation present. No thyromegaly present.  Cardiovascular: Normal rate, regular rhythm, normal heart sounds and intact distal pulses.   Pulmonary/Chest: Effort normal and breath sounds normal.  Abdominal: Soft. Bowel sounds are normal. He exhibits no distension and no mass. There is no tenderness. There is no rebound and no guarding.  Musculoskeletal: Normal range of motion. He exhibits no edema or tenderness.  Lymphadenopathy:    He has no cervical adenopathy.  Neurological: He is alert and oriented to person, place, and  time. He has normal reflexes. No cranial nerve deficit. He exhibits normal muscle tone. Coordination normal.  Skin: Skin is warm and dry.  Psychiatric: He has a normal mood and affect. His behavior is normal. Judgment and thought content normal.       Assessment & Plan:   Problem List Items Addressed This Visit      Other   Routine general medical examination at a health care facility - Primary    1) Anticipatory Guidance: Discussed importance of wearing a seatbelt while driving and not texting while driving; changing batteries in smoke detector at least once annually; wearing suntan lotion when outside; eating a balanced and moderate diet; getting physical activity at least 30 minutes per day.  2) Immunizations / Screenings / Labs:  Declines flu shot. All other immunizations are up to date per recommendations. Check on need for colonoscopy with GI. Obtain PSA for prostate evaluation. Due for spirometry - will check insurance for coverage. All other screenings are up to date per recommendations. Obtain CBC, CMET, Lipid profile and TSH.   Overall well exam with minimal risk factors for cardiovascular and chronic disease. Asthma is controlled with Pulmicort and does not currently use albuterol. He will check on spirometry coverage for testing. Discussed emphasizing nutrient dense foods including fruits, vegetables, and legumes. Increase physical activity of 30 minutes most days of the week as tolerated. Continue other healthy lifestyle behaviors and choices. Follow-up office visit pending blood work. Follow-up prevention exam in one year.       Relevant Orders   CBC   Comprehensive metabolic panel   Lipid panel   PSA   TSH

## 2014-12-23 NOTE — Progress Notes (Signed)
Pre visit review using our clinic review tool, if applicable. No additional management support is needed unless otherwise documented below in the visit note.   Gets flu shots at work and wants to wait on tetanus due to not knowing when his last shot was.

## 2014-12-23 NOTE — Assessment & Plan Note (Signed)
1) Anticipatory Guidance: Discussed importance of wearing a seatbelt while driving and not texting while driving; changing batteries in smoke detector at least once annually; wearing suntan lotion when outside; eating a balanced and moderate diet; getting physical activity at least 30 minutes per day.  2) Immunizations / Screenings / Labs:  Declines flu shot. All other immunizations are up to date per recommendations. Check on need for colonoscopy with GI. Obtain PSA for prostate evaluation. Due for spirometry - will check insurance for coverage. All other screenings are up to date per recommendations. Obtain CBC, CMET, Lipid profile and TSH.   Overall well exam with minimal risk factors for cardiovascular and chronic disease. Asthma is controlled with Pulmicort and does not currently use albuterol. He will check on spirometry coverage for testing. Discussed emphasizing nutrient dense foods including fruits, vegetables, and legumes. Increase physical activity of 30 minutes most days of the week as tolerated. Continue other healthy lifestyle behaviors and choices. Follow-up office visit pending blood work. Follow-up prevention exam in one year.

## 2014-12-29 ENCOUNTER — Other Ambulatory Visit (INDEPENDENT_AMBULATORY_CARE_PROVIDER_SITE_OTHER): Payer: 59

## 2014-12-29 DIAGNOSIS — Z0000 Encounter for general adult medical examination without abnormal findings: Secondary | ICD-10-CM | POA: Diagnosis not present

## 2014-12-29 LAB — TSH: TSH: 1.58 u[IU]/mL (ref 0.35–4.50)

## 2014-12-29 LAB — COMPREHENSIVE METABOLIC PANEL
ALK PHOS: 45 U/L (ref 39–117)
ALT: 19 U/L (ref 0–53)
AST: 17 U/L (ref 0–37)
Albumin: 4.1 g/dL (ref 3.5–5.2)
BUN: 16 mg/dL (ref 6–23)
CO2: 25 mEq/L (ref 19–32)
Calcium: 9.2 mg/dL (ref 8.4–10.5)
Chloride: 108 mEq/L (ref 96–112)
Creatinine, Ser: 1.04 mg/dL (ref 0.40–1.50)
GFR: 78.51 mL/min (ref >60.00)
Glucose, Bld: 105 mg/dL — ABNORMAL HIGH (ref 70–99)
Potassium: 3.9 mEq/L (ref 3.5–5.1)
Sodium: 142 mEq/L (ref 135–145)
TOTAL PROTEIN: 6.7 g/dL (ref 6.0–8.3)
Total Bilirubin: 0.8 mg/dL (ref 0.2–1.2)

## 2014-12-29 LAB — CBC
HCT: 44.9 % (ref 39.0–52.0)
HEMOGLOBIN: 15.1 g/dL (ref 13.0–17.0)
MCHC: 33.6 g/dL (ref 30.0–36.0)
MCV: 90.9 fl (ref 78.0–100.0)
Platelets: 214 10*3/uL (ref 150.0–400.0)
RBC: 4.94 Mil/uL (ref 4.22–5.81)
RDW: 13.1 % (ref 11.5–15.5)
WBC: 6.4 10*3/uL (ref 4.0–10.5)

## 2014-12-29 LAB — LIPID PANEL
Cholesterol: 157 mg/dL (ref 0–200)
HDL: 55.3 mg/dL (ref >39.00)
LDL Cholesterol: 91 mg/dL (ref 0–99)
NONHDL: 102.06
Total CHOL/HDL Ratio: 3
Triglycerides: 56 mg/dL (ref 0.0–149.0)
VLDL: 11.2 mg/dL (ref 0.0–40.0)

## 2014-12-29 LAB — PSA: PSA: 0.53 ng/mL (ref 0.10–4.00)

## 2015-01-04 ENCOUNTER — Encounter: Payer: Self-pay | Admitting: Family

## 2015-02-28 ENCOUNTER — Telehealth: Payer: Self-pay

## 2015-02-28 NOTE — Telephone Encounter (Signed)
LVM for pt to call back as soon as possible.   RE: Flu Vaccine for 2016 season.

## 2015-03-05 NOTE — Telephone Encounter (Signed)
Flu vaccine has been documented.

## 2015-03-05 NOTE — Telephone Encounter (Signed)
Pt received his vaccine in October at work

## 2015-03-25 ENCOUNTER — Encounter: Payer: Self-pay | Admitting: Family Medicine

## 2015-03-25 ENCOUNTER — Ambulatory Visit (INDEPENDENT_AMBULATORY_CARE_PROVIDER_SITE_OTHER): Payer: 59 | Admitting: Family Medicine

## 2015-03-25 VITALS — BP 116/78 | HR 60 | Temp 98.1°F | Wt 218.2 lb

## 2015-03-25 DIAGNOSIS — J0110 Acute frontal sinusitis, unspecified: Secondary | ICD-10-CM | POA: Diagnosis not present

## 2015-03-25 MED ORDER — GUAIFENESIN-CODEINE 100-10 MG/5ML PO SYRP: 5.0000 mL | ORAL_SOLUTION | Freq: Two times a day (BID) | ORAL | Status: DC | PRN

## 2015-03-25 MED ORDER — GUAIFENESIN-CODEINE 100-10 MG/5ML PO SYRP: 5.0000 mL | ORAL_SOLUTION | Freq: Three times a day (TID) | ORAL | Status: DC | PRN

## 2015-03-25 NOTE — Progress Notes (Signed)
BP 116/78 mmHg  Pulse 60  Temp(Src) 98.1 F (36.7 C) (Oral)  Wt 218 lb 4 oz (98.998 kg)  SpO2 95%   CC: "got the crud" Subjective:    Patient ID: Joshua Li, male    DOB: [birth year], 54 y.o.   MRN: [id number]  HPI: BHAVESH VAZQUEZ is a 54 y.o. male presenting on 03/25/2015 for URI   5d h/o congestion, mildly productive cough, ST with PNDrainage, headache, low grade fever. Out of work and in bed last 2 days. Trouble sleeping at night time due to head congestion.   No fevers/chills, ear or tooth pain, dyspnea or wheezing.   So far has tried OTC remedies (nyquil, dayquil, salt water gargles, neti pot).  No sick contacts at home. No smokers at home. + asthma - rare flare. + seasonal allergic rhinitis.   Relevant past medical, surgical, family and social history reviewed and updated as indicated. Interim medical history since our last visit reviewed. Allergies and medications reviewed and updated. Current Outpatient Prescriptions on File Prior to Visit  Medication Sig  . aspirin 81 MG tablet Take 81 mg by mouth daily.    . budesonide (PULMICORT) 180 MCG/ACT inhaler Inhale 2 puffs into the lungs 2 (two) times daily.  Marland Kitchen loratadine (CLARITIN) 10 MG tablet Take 1 tablet (10 mg total) by mouth daily.  . metroNIDAZOLE (FLAGYL) 250 MG tablet Take 250 mg by mouth 3 (three) times daily.  Marland Kitchen omeprazole (PRILOSEC) 40 MG capsule Take 1 capsule by mouth  daily  . propranolol (INDERAL) 10 MG tablet Take 1 tablet (10 mg total) by mouth as needed. Take as needed for palpitations not to exceed 3 times a day .do not use if lightheaded  . triamcinolone (NASACORT ALLERGY 24HR) 55 MCG/ACT AERO nasal inhaler Apply 2 sprays to each nostril once daily   No current facility-administered medications on file prior to visit.    Review of Systems Per HPI unless specifically indicated in ROS section     Objective:    BP 116/78 mmHg  Pulse 60  Temp(Src) 98.1 F (36.7 C) (Oral)  Wt 218 lb 4 oz (98.998  kg)  SpO2 95%  Wt Readings from Last 3 Encounters:  03/25/15 218 lb 4 oz (98.998 kg)  12/23/14 212 lb (96.163 kg)  05/17/14 223 lb 8 oz (101.379 kg)    Physical Exam  Constitutional: He appears well-developed and well-nourished. No distress.  HENT:  Head: Normocephalic and atraumatic.  Right Ear: Hearing, tympanic membrane, external ear and ear canal normal.  Left Ear: Hearing, tympanic membrane, external ear and ear canal normal.  Nose: Mucosal edema (L>R) present. No rhinorrhea. Right sinus exhibits no maxillary sinus tenderness and no frontal sinus tenderness. Left sinus exhibits frontal sinus tenderness. Left sinus exhibits no maxillary sinus tenderness.  Mouth/Throat: Uvula is midline and mucous membranes are normal. Posterior oropharyngeal edema and posterior oropharyngeal erythema present. No oropharyngeal exudate or tonsillar abscesses.  Nasal mucosal injection L>R  Eyes: Conjunctivae and EOM are normal. Pupils are equal, round, and reactive to light. No scleral icterus.  Neck: Normal range of motion. Neck supple.  Cardiovascular: Normal rate, regular rhythm, normal heart sounds and intact distal pulses.   No murmur heard. Pulmonary/Chest: Effort normal and breath sounds normal. No respiratory distress. He has no wheezes. He has no rales.  Lymphadenopathy:    He has no cervical adenopathy.  Skin: Skin is warm and dry. No rash noted.  Nursing note and vitals reviewed.  Assessment & Plan:   Problem List Items Addressed This Visit    Acute frontal sinusitis - Primary    Anticipate viral given short duration. Discussed anticipated course of improvement. Discussed supportive care as per instructions. Discussed sxs indicative of bacterial superinfection and to notify us if this develops for abx course.       Relevant Medications   guaiFENesin-codeine (CHERATUSSIN AC) 100-10 MG/5ML syrup       Follow up plan: No Follow-up on file.

## 2015-03-25 NOTE — Patient Instructions (Signed)
You have a sinus infection, likely viral. Take medicine as prescribed: cheratussin for cough. Push fluids and plenty of rest. May use ibuprofen 400-610m with meals for inflammation. Restart flonase. May use mucinex D with plenty of fluid to help mobilize mucous. Please let [place] if fever >101.5, ongoing symptoms past 10 days, worsening after initial improvement, or worsening productive cough.

## 2015-03-25 NOTE — Progress Notes (Signed)
Pre visit review using our clinic review tool, if applicable. No additional management support is needed unless otherwise documented below in the visit note. 

## 2015-03-25 NOTE — Assessment & Plan Note (Signed)
Anticipate viral given short duration. Discussed anticipated course of improvement. Discussed supportive care as per instructions. Discussed sxs indicative of bacterial superinfection and to notify us if this develops for abx course.

## 2015-04-20 ENCOUNTER — Telehealth: Payer: Self-pay

## 2015-04-20 MED ORDER — AZITHROMYCIN 250 MG PO TABS: ORAL_TABLET | ORAL | Status: DC

## 2015-04-20 NOTE — Telephone Encounter (Signed)
Spoke w pt. Likely post infectious cough but given comorbidities of UC and asthma will treat with zpack. rec restart pulmicort inhaler. Update if persistent cough after this.

## 2015-04-20 NOTE — Telephone Encounter (Signed)
Pt left v/m; pt was seen 03/25/15; pt still has dry hacking cough and request rx discussed at appt if symptoms did not clear. [facility].

## 2015-07-04 ENCOUNTER — Ambulatory Visit (INDEPENDENT_AMBULATORY_CARE_PROVIDER_SITE_OTHER): Payer: 59 | Admitting: Internal Medicine

## 2015-07-04 ENCOUNTER — Encounter: Payer: Self-pay | Admitting: Internal Medicine

## 2015-07-04 VITALS — BP 110/70 | HR 60 | Temp 98.9°F | Ht 75.0 in | Wt 226.0 lb

## 2015-07-04 DIAGNOSIS — J209 Acute bronchitis, unspecified: Secondary | ICD-10-CM

## 2015-07-04 DIAGNOSIS — J4521 Mild intermittent asthma with (acute) exacerbation: Secondary | ICD-10-CM

## 2015-07-04 DIAGNOSIS — J45909 Unspecified asthma, uncomplicated: Secondary | ICD-10-CM | POA: Insufficient documentation

## 2015-07-04 DIAGNOSIS — J45901 Unspecified asthma with (acute) exacerbation: Secondary | ICD-10-CM | POA: Insufficient documentation

## 2015-07-04 MED ORDER — DOXYCYCLINE HYCLATE 100 MG PO TABS: 100.0000 mg | ORAL_TABLET | Freq: Two times a day (BID) | ORAL | Status: DC

## 2015-07-04 MED ORDER — PREDNISONE 20 MG PO TABS: 40.0000 mg | ORAL_TABLET | Freq: Every day | ORAL | Status: DC

## 2015-07-04 NOTE — Assessment & Plan Note (Signed)
Most likely viral--- with asthma flare If worsens, will take the doxy

## 2015-07-04 NOTE — Assessment & Plan Note (Signed)
Mild He is fairly compliant with the pulmicort--told him to make sure he takes it in allergy season Failed montelukast Will give brief course of prednisone

## 2015-07-04 NOTE — Progress Notes (Signed)
   Subjective:    Patient ID: Joshua Li, male    DOB: Jul 21, 1960, 55 y.o.   MRN: [id number]  HPI Here due to respiratory illness  Has sore throat and congestion (chest and head) Some yellow sputum Sweats last night--not sure if he has fever Was in the yard working 4 days ago--symptoms started the next day No ear pain  No headache --just head fullness Has sore throat Not really SOB--taking it easy.  Tried advil and cough med--- not clearly helpful Takes loratadine and flonase  Current Outpatient Prescriptions on File Prior to Visit  Medication Sig Dispense Refill  . aspirin 81 MG tablet Take 81 mg by mouth daily.      . budesonide (PULMICORT) 180 MCG/ACT inhaler Inhale 2 puffs into the lungs 2 (two) times daily. 1 each 4  . loratadine (CLARITIN) 10 MG tablet Take 1 tablet (10 mg total) by mouth daily. 30 tablet 5  . omeprazole (PRILOSEC) 40 MG capsule Take 1 capsule by mouth  daily 90 capsule 1  . propranolol (INDERAL) 10 MG tablet Take 1 tablet (10 mg total) by mouth as needed. Take as needed for palpitations not to exceed 3 times a day .do not use if lightheaded 30 tablet prn  . triamcinolone (NASACORT ALLERGY 24HR) 55 MCG/ACT AERO nasal inhaler Apply 2 sprays to each nostril once daily 1 Inhaler 5   No current facility-administered medications on file prior to visit.    No Known Allergies  Past Medical History  Diagnosis Date  . Hyperlipidemia   . Asthma   . GERD (gastroesophageal reflux disease)   . Ulcerative colitis   . Allergy   . Hiatal hernia     Past Surgical History  Procedure Laterality Date  . Tonsillectomy    . Cholecystectomy    . Colectomy  2003    with one step take-down to J-pouch    Family History  Problem Relation Age of Onset  . Cancer Mother     breast and ovarian  . Cancer Father     stomach & bladder  . Heart disease Father     Social History   Social History  . Marital Status: Divorced    Spouse Name: N/A  . Number of  Children: 1  . Years of Education: 16   Occupational History  . Retail    Social History Main Topics  . Smoking status: Never Smoker   . Smokeless tobacco: Never Used  . Alcohol Use: 1.2 oz/week    2 Cans of beer per week  . Drug Use: No  . Sexual Activity: Yes   Other Topics Concern  . Not on file   Social History Narrative   Fun: [employer]   Denies religious beliefs effecting health care.    Review of Systems Non smoker    Objective:   Physical Exam  Constitutional: He appears well-developed and well-nourished. No distress.  HENT:  Mouth/Throat: Oropharynx is clear and moist. No oropharyngeal exudate.  No sinus tenderness Moderate nasal inflammation TMs normal  Neck: Normal range of motion.  Pulmonary/Chest: Effort normal. He has no wheezes. He has no rales.  Mild increased expiratory wheezes and prolongation Still has pretty good air movement  Lymphadenopathy:    He has no cervical adenopathy.          Assessment & Plan:

## 2015-07-04 NOTE — Progress Notes (Signed)
Pre visit review using our clinic review tool, if applicable. No additional management support is needed unless otherwise documented below in the visit note. 

## 2015-07-06 ENCOUNTER — Other Ambulatory Visit: Payer: Self-pay | Admitting: Family

## 2015-12-24 ENCOUNTER — Encounter: Payer: Self-pay | Admitting: Family

## 2015-12-24 ENCOUNTER — Other Ambulatory Visit (INDEPENDENT_AMBULATORY_CARE_PROVIDER_SITE_OTHER): Payer: 59

## 2015-12-24 ENCOUNTER — Ambulatory Visit (INDEPENDENT_AMBULATORY_CARE_PROVIDER_SITE_OTHER): Payer: 59 | Admitting: Family

## 2015-12-24 VITALS — BP 112/80 | HR 46 | Temp 97.8°F | Resp 16 | Ht 75.0 in | Wt 225.0 lb

## 2015-12-24 DIAGNOSIS — Z0000 Encounter for general adult medical examination without abnormal findings: Secondary | ICD-10-CM | POA: Diagnosis not present

## 2015-12-24 DIAGNOSIS — Z23 Encounter for immunization: Secondary | ICD-10-CM

## 2015-12-24 LAB — COMPREHENSIVE METABOLIC PANEL
ALBUMIN: 4 g/dL (ref 3.5–5.2)
ALT: 19 U/L (ref 0–53)
AST: 16 U/L (ref 0–37)
Alkaline Phosphatase: 45 U/L (ref 39–117)
BUN: 18 mg/dL (ref 6–23)
CALCIUM: 9 mg/dL (ref 8.4–10.5)
CHLORIDE: 107 meq/L (ref 96–112)
CO2: 28 meq/L (ref 19–32)
Creatinine, Ser: 1.24 mg/dL (ref 0.40–1.50)
GFR: 63.86 mL/min (ref >60.00)
Glucose, Bld: 95 mg/dL (ref 70–99)
POTASSIUM: 4.5 meq/L (ref 3.5–5.1)
Sodium: 142 mEq/L (ref 135–145)
Total Bilirubin: 1 mg/dL (ref 0.2–1.2)
Total Protein: 6.8 g/dL (ref 6.0–8.3)

## 2015-12-24 LAB — CBC
HEMATOCRIT: 46.4 % (ref 39.0–52.0)
HEMOGLOBIN: 16.1 g/dL (ref 13.0–17.0)
MCHC: 34.6 g/dL (ref 30.0–36.0)
MCV: 88.7 fl (ref 78.0–100.0)
PLATELETS: 221 10*3/uL (ref 150.0–400.0)
RBC: 5.23 Mil/uL (ref 4.22–5.81)
RDW: 13.2 % (ref 11.5–15.5)
WBC: 7.1 10*3/uL (ref 4.0–10.5)

## 2015-12-24 LAB — LIPID PANEL
CHOL/HDL RATIO: 3
Cholesterol: 170 mg/dL (ref 0–200)
HDL: 58.1 mg/dL (ref >39.00)
LDL CALC: 98 mg/dL (ref 0–99)
NonHDL: 111.66
TRIGLYCERIDES: 70 mg/dL (ref 0.0–149.0)
VLDL: 14 mg/dL (ref 0.0–40.0)

## 2015-12-24 LAB — PSA: PSA: 0.47 ng/mL (ref 0.10–4.00)

## 2015-12-24 LAB — VITAMIN D 25 HYDROXY (VIT D DEFICIENCY, FRACTURES): VITD: 28.57 ng/mL — AB (ref 30.00–100.00)

## 2015-12-24 NOTE — Patient Instructions (Addendum)
Thank you for choosing [facility].  SUMMARY AND INSTRUCTIONS:  Medication:  Your prescription(s) have been submitted to your pharmacy or been printed and provided for you. Please take as directed and contact our office if you believe you are having problem(s) with the medication(s) or have any questions.  Labs:  Please stop by the lab on the lower level of the building for your blood work. Your results will be released to [place] (or called to you) after review, usually within 72 hours after test completion. If any changes need to be made, you will be notified at that same time.  1.) The lab is open from 7:30am to 5:30 pm Monday-Friday 2.) No appointment is necessary 3.) Fasting (if needed) is 6-8 hours after food and drink; black coffee and water are okay   Follow up:  If your symptoms worsen or fail to improve, please contact our office for further instruction, or in case of emergency go directly to the emergency room at the closest medical facility.   Health Maintenance, Male A healthy lifestyle and preventative care can promote health and wellness.  Maintain regular health, dental, and eye exams.  Eat a healthy diet. Foods like vegetables, fruits, whole grains, low-fat dairy products, and lean protein foods contain the nutrients you need and are low in calories. Decrease your intake of foods high in solid fats, added sugars, and salt. Get information about a proper diet from your health care provider, if necessary.  Regular physical exercise is one of the most important things you can do for your health. Most adults should get at least 150 minutes of moderate-intensity exercise (any activity that increases your heart rate and causes you to sweat) each week. In addition, most adults need muscle-strengthening exercises on 2 or more days a week.   Maintain a healthy weight. The body mass index (BMI) is a screening tool to identify possible weight problems. It provides an  estimate of body fat based on height and weight. Your health care provider can find your BMI and can help you achieve or maintain a healthy weight. For males 20 years and older:  A BMI below 18.5 is considered underweight.  A BMI of 18.5 to 24.9 is normal.  A BMI of 25 to 29.9 is considered overweight.  A BMI of 30 and above is considered obese.  Maintain normal blood lipids and cholesterol by exercising and minimizing your intake of saturated fat. Eat a balanced diet with plenty of fruits and vegetables. Blood tests for lipids and cholesterol should begin at age 54 and be repeated every 5 years. If your lipid or cholesterol levels are high, you are over age 64, or you are at high risk for heart disease, you may need your cholesterol levels checked more frequently.Ongoing high lipid and cholesterol levels should be treated with medicines if diet and exercise are not working.  If you smoke, find out from your health care provider how to quit. If you do not use tobacco, do not start.  Lung cancer screening is recommended for adults aged 23-80 years who are at high risk for developing lung cancer because of a history of smoking. A yearly low-dose CT scan of the lungs is recommended for people who have at least a 30-pack-year history of smoking and are current smokers or have quit within the past 15 years. A pack year of smoking is smoking an average of 1 pack of cigarettes a day for 1 year (for example, a 30-pack-year history of  smoking could mean smoking 1 pack a day for 30 years or 2 packs a day for 15 years). Yearly screening should continue until the smoker has stopped smoking for at least 15 years. Yearly screening should be stopped for people who develop a health problem that would prevent them from having lung cancer treatment.  If you choose to drink alcohol, do not have more than 2 drinks per day. One drink is considered to be 12 oz (360 mL) of beer, 5 oz (150 mL) of wine, or 1.5 oz (45 mL) of  liquor.  Avoid the use of street drugs. Do not share needles with anyone. Ask for help if you need support or instructions about stopping the use of drugs.  High blood pressure causes heart disease and increases the risk of stroke. High blood pressure is more likely to develop in:  People who have blood pressure in the end of the normal range (100-139/85-89 mm Hg).  People who are overweight or obese.  People who are African American.  If you are 15-15 years of age, have your blood pressure checked every 3-5 years. If you are 75 years of age or older, have your blood pressure checked every year. You should have your blood pressure measured twice--once when you are at a hospital or clinic, and once when you are not at a hospital or clinic. Record the average of the two measurements. To check your blood pressure when you are not at a hospital or clinic, you can use:  An automated blood pressure machine at a pharmacy.  A home blood pressure monitor.  If you are 16-36 years old, ask your health care provider if you should take aspirin to prevent heart disease.  Diabetes screening involves taking a blood sample to check your fasting blood sugar level. This should be done once every 3 years after age 76 if you are at a normal weight and without risk factors for diabetes. Testing should be considered at a younger age or be carried out more frequently if you are overweight and have at least 1 risk factor for diabetes.  Colorectal cancer can be detected and often prevented. Most routine colorectal cancer screening begins at the age of 14 and continues through age 1. However, your health care provider may recommend screening at an earlier age if you have risk factors for colon cancer. On a yearly basis, your health care provider may provide home test kits to check for hidden blood in the stool. A small camera at the end of a tube may be used to directly examine the colon (sigmoidoscopy or colonoscopy)  to detect the earliest forms of colorectal cancer. Talk to your health care provider about this at age 10 when routine screening begins. A direct exam of the colon should be repeated every 5-10 years through age 47, unless early forms of precancerous polyps or small growths are found.  People who are at an increased risk for hepatitis B should be screened for this virus. You are considered at high risk for hepatitis B if:  You were born in a country where hepatitis B occurs often. Talk with your health care provider about which countries are considered high risk.  Your parents were born in a high-risk country and you have not received a shot to protect against hepatitis B (hepatitis B vaccine).  You have HIV or AIDS.  You use needles to inject street drugs.  You live with, or have sex with, someone who has hepatitis B.  You are a man who has sex with other men (MSM).  You get hemodialysis treatment.  You take certain medicines for conditions like cancer, organ transplantation, and autoimmune conditions.  Hepatitis C blood testing is recommended for all people born from [birth year] through 1967 and any individual with known risk factors for hepatitis C.  Healthy men should no longer receive prostate-specific antigen (PSA) blood tests as part of routine cancer screening. Talk to your health care provider about prostate cancer screening.  Testicular cancer screening is not recommended for adolescents or adult males who have no symptoms. Screening includes self-exam, a health care provider exam, and other screening tests. Consult with your health care provider about any symptoms you have or any concerns you have about testicular cancer.  Practice safe sex. Use condoms and avoid high-risk sexual practices to reduce the spread of sexually transmitted infections (STIs).  You should be screened for STIs, including gonorrhea and chlamydia if:  You are sexually active and are younger than 24  years.  You are older than 24 years, and your health care provider tells you that you are at risk for this type of infection.  Your sexual activity has changed since you were last screened, and you are at an increased risk for chlamydia or gonorrhea. Ask your health care provider if you are at risk.  If you are at risk of being infected with HIV, it is recommended that you take a prescription medicine daily to prevent HIV infection. This is called pre-exposure prophylaxis (PrEP). You are considered at risk if:  You are a man who has sex with other men (MSM).  You are a heterosexual man who is sexually active with multiple partners.  You take drugs by injection.  You are sexually active with a partner who has HIV.  Talk with your health care provider about whether you are at high risk of being infected with HIV. If you choose to begin PrEP, you should first be tested for HIV. You should then be tested every 3 months for as long as you are taking PrEP.  Use sunscreen. Apply sunscreen liberally and repeatedly throughout the day. You should seek shade when your shadow is shorter than you. Protect yourself by wearing long sleeves, pants, a wide-brimmed hat, and sunglasses year round whenever you are outdoors.  Tell your health care provider of new moles or changes in moles, especially if there is a change in shape or color. Also, tell your health care provider if a mole is larger than the size of a pencil eraser.  A one-time screening for abdominal aortic aneurysm (AAA) and surgical repair of large AAAs by ultrasound is recommended for men aged 31-75 years who are current or former smokers.  Stay current with your vaccines (immunizations).   This information is not intended to replace advice given to you by your health care provider. Make sure you discuss any questions you have with your health care provider.   Document Released: 09/10/2007 Document Revised: 04/04/2014 Document Reviewed:  08/09/2010 Elsevier Interactive Patient Education [employer].

## 2015-12-24 NOTE — Progress Notes (Signed)
Subjective:    Patient ID: Joshua Li, male    DOB: [birth year], 55 y.o.   MRN: [id number]  Chief Complaint  Patient presents with  . CPE    fasting    HPI:  Joshua Li is a 55 y.o. male who presents today for an annual wellness visit.   1) Health Maintenance -   Diet - Averages about 3 meals per day consisting of a regular diet; Caffeine intake of 1-2 cups daily. Regular fast/processed foods.   Exercise - 2-3 times per week.    2) Preventative Exams / Immunizations:  Dental -- Up to date  Vision -- Up to date   Health Maintenance  Topic Date Due  . Hepatitis C Screening  1960/10/28  . HIV Screening  12/15/1973  . COLONOSCOPY  08/24/2010  . INFLUENZA VACCINE  10/27/2015  . TETANUS/TDAP  06/28/2021     Immunization History  Administered Date(s) Administered  . Influenza Split 12/26/2013  . Influenza Whole 01/22/2007, 03/09/2009  . Influenza,inj,Quad PF,36+ Mos 12/24/2015  . Influenza-Unspecified 12/27/2014  . Tdap 06/29/2011     No Known Allergies   Outpatient Medications Prior to Visit  Medication Sig Dispense Refill  . aspirin 81 MG tablet Take 81 mg by mouth daily.      . budesonide (PULMICORT) 180 MCG/ACT inhaler Inhale 2 puffs into the lungs 2 (two) times daily. 1 each 4  . omeprazole (PRILOSEC) 40 MG capsule TAKE 1 CAPSULE BY MOUTH DAILY 90 capsule 1  . propranolol (INDERAL) 10 MG tablet Take 1 tablet (10 mg total) by mouth as needed. Take as needed for palpitations not to exceed 3 times a day .do not use if lightheaded 30 tablet prn  . triamcinolone (NASACORT ALLERGY 24HR) 55 MCG/ACT AERO nasal inhaler Apply 2 sprays to each nostril once daily 1 Inhaler 5  . loratadine (CLARITIN) 10 MG tablet Take 1 tablet (10 mg total) by mouth daily. 30 tablet 5  . doxycycline (VIBRA-TABS) 100 MG tablet Take 1 tablet (100 mg total) by mouth 2 (two) times daily. 14 tablet 0  . predniSONE (DELTASONE) 20 MG tablet Take 2 tablets (40 mg total) by mouth daily.  For 3 days, then 1 tab daily for 3 days 9 tablet 0   No facility-administered medications prior to visit.      Past Medical History:  Diagnosis Date  . Allergy   . Asthma   . GERD (gastroesophageal reflux disease)   . Hiatal hernia   . Hyperlipidemia   . Ulcerative colitis      Past Surgical History:  Procedure Laterality Date  . CHOLECYSTECTOMY    . COLECTOMY  2003   with one step take-down to J-pouch  . TONSILLECTOMY       Family History  Problem Relation Age of Onset  . Cancer Mother     breast and ovarian  . Cancer Father     stomach & bladder  . Heart disease Father      Social History   Social History  . Marital status: Divorced    Spouse name: N/A  . Number of children: 1  . Years of education: 26   Occupational History  . Retail    Social History Main Topics  . Smoking status: Never Smoker  . Smokeless tobacco: Never Used  . Alcohol use 1.2 oz/week    2 Cans of beer per week  . Drug use: No  . Sexual activity: Yes   Other Topics Concern  .  Not on file   Social History Narrative   Fun: [employer]   Denies religious beliefs effecting health care.       Review of Systems  Constitutional: Denies fever, chills, fatigue, or significant weight gain/loss. HENT: Head: Denies headache or neck pain Ears: Denies changes in hearing, ringing in ears, earache, drainage Nose: Denies discharge, stuffiness, itching, nosebleed, sinus pain Throat: Denies sore throat, hoarseness, dry mouth, sores, thrush Eyes: Denies loss/changes in vision, pain, redness, blurry/double vision, flashing lights Cardiovascular: Denies chest pain/discomfort, tightness, palpitations, shortness of breath with activity, difficulty lying down, swelling, sudden awakening with shortness of breath Respiratory: Denies shortness of breath, cough, sputum production, wheezing Gastrointestinal: Denies dysphasia, heartburn, change in appetite, nausea, change in bowel habits, rectal  bleeding, constipation, diarrhea, yellow skin or eyes Genitourinary: Denies frequency, urgency, burning/pain, blood in urine, incontinence, change in urinary strength. Musculoskeletal: Denies muscle/joint pain, stiffness, back pain, redness or swelling of joints, trauma Skin: Denies rashes, lumps, itching, dryness, color changes, or hair/nail changes Neurological: Denies dizziness, fainting, seizures, weakness, numbness, tingling, tremor Psychiatric - Denies nervousness, stress, depression or memory loss Endocrine: Denies heat or cold intolerance, sweating, frequent urination, excessive thirst, changes in appetite Hematologic: Denies ease of bruising or bleeding     Objective:     BP 112/80 (BP Location: Left Arm, Patient Position: Sitting, Cuff Size: Large)   Pulse (!) 46   Temp 97.8 F (36.6 C) (Oral)   Resp 16   Ht 6' 3"  (1.905 m)   Wt 225 lb (102.1 kg)   SpO2 97%   BMI 28.12 kg/m  Nursing note and vital signs reviewed.  Physical Exam  Constitutional: He is oriented to person, place, and time. He appears well-developed and well-nourished.  HENT:  Head: Normocephalic.  Right Ear: Hearing, tympanic membrane, external ear and ear canal normal.  Left Ear: Hearing, tympanic membrane, external ear and ear canal normal.  Nose: Nose normal.  Mouth/Throat: Uvula is midline, oropharynx is clear and moist and mucous membranes are normal.  Eyes: Conjunctivae and EOM are normal. Pupils are equal, round, and reactive to light.  Neck: Neck supple. No JVD present. No tracheal deviation present. No thyromegaly present.  Cardiovascular: Normal rate, regular rhythm, normal heart sounds and intact distal pulses.   Pulmonary/Chest: Effort normal and breath sounds normal.  Abdominal: Soft. Bowel sounds are normal. He exhibits no distension and no mass. There is no tenderness. There is no rebound and no guarding.  Musculoskeletal: Normal range of motion. He exhibits no edema or tenderness.    Lymphadenopathy:    He has no cervical adenopathy.  Neurological: He is alert and oriented to person, place, and time. He has normal reflexes. No cranial nerve deficit. He exhibits normal muscle tone. Coordination normal.  Skin: Skin is warm and dry.  Psychiatric: He has a normal mood and affect. His behavior is normal. Judgment and thought content normal.       Assessment & Plan:   Problem List Items Addressed This Visit      Other   Routine general medical examination at a health care facility - Primary    1) Anticipatory Guidance: Discussed importance of wearing a seatbelt while driving and not texting while driving; changing batteries in smoke detector at least once annually; wearing suntan lotion when outside; eating a balanced and moderate diet; getting physical activity at least 30 minutes per day.  2) Immunizations / Screenings / Labs:  Influenza updated today. All other immunizations are up to date per  recommendations. Obtain PSA for prostate cancer screening. Obtain hepatitis C antibody for hepatitis C screening. Check with gastroenterology for need for colonoscopy. Obtain vitamin D for vitamin D deficiency. All other screenings are up-to-date per recommendations. Obtain CBC, CMET, and lipid profile.    Overall well exam with risk factors for cardiovascular disease including hyperlipidemia and overweight. Recommend increasing physical activity to goal of 30 minutes of moderate level activity daily. Nutritionally recommend decrease processed/sugary food intake. Continue other healthy lifestyle behaviors and choices. Follow-up prevention exam in 1 year. Follow-up office visit for chronic conditions as necessary.       Relevant Orders   CBC (Completed)   Comprehensive metabolic panel (Completed)   Lipid panel (Completed)   PSA (Completed)   VITAMIN D 25 Hydroxy (Vit-D Deficiency, Fractures) (Completed)   Hepatitis C antibody    Other Visit Diagnoses    Encounter for  immunization       Relevant Orders   Flu Vaccine QUAD 36+ mos IM (Completed)       I have discontinued Mr. Carrico's loratadine, doxycycline, and predniSONE. I am also having him maintain his aspirin, triamcinolone, propranolol, budesonide, and omeprazole.  Follow-up: Return in about 6 months (around 06/22/2016), or if symptoms worsen or fail to improve.   Mauricio Po, FNP

## 2015-12-24 NOTE — Assessment & Plan Note (Signed)
1) Anticipatory Guidance: Discussed importance of wearing a seatbelt while driving and not texting while driving; changing batteries in smoke detector at least once annually; wearing suntan lotion when outside; eating a balanced and moderate diet; getting physical activity at least 30 minutes per day.  2) Immunizations / Screenings / Labs:  Influenza updated today. All other immunizations are up to date per recommendations. Obtain PSA for prostate cancer screening. Obtain hepatitis C antibody for hepatitis C screening. Check with gastroenterology for need for colonoscopy. Obtain vitamin D for vitamin D deficiency. All other screenings are up-to-date per recommendations. Obtain CBC, CMET, and lipid profile.    Overall well exam with risk factors for cardiovascular disease including hyperlipidemia and overweight. Recommend increasing physical activity to goal of 30 minutes of moderate level activity daily. Nutritionally recommend decrease processed/sugary food intake. Continue other healthy lifestyle behaviors and choices. Follow-up prevention exam in 1 year. Follow-up office visit for chronic conditions as necessary.

## 2015-12-25 ENCOUNTER — Encounter: Payer: Self-pay | Admitting: Family

## 2015-12-25 LAB — HEPATITIS C ANTIBODY: HCV AB: NEGATIVE

## 2016-01-12 ENCOUNTER — Other Ambulatory Visit: Payer: Self-pay | Admitting: Family

## 2016-03-18 ENCOUNTER — Other Ambulatory Visit: Payer: Self-pay | Admitting: Family

## 2016-04-29 ENCOUNTER — Other Ambulatory Visit: Payer: Self-pay | Admitting: Family

## 2016-04-29 ENCOUNTER — Telehealth: Payer: Self-pay | Admitting: Family

## 2016-04-29 NOTE — Telephone Encounter (Signed)
Pt aware an has an appointment set up for 05/13/16

## 2016-04-29 NOTE — Telephone Encounter (Signed)
Pt called in would like to know if he can get a referral to Cardiology . He said that he as heart palpations not urgent but would like to see a cardiologist

## 2016-04-29 NOTE — Telephone Encounter (Signed)
They usually require an office to document and I am more than happy to send him to cardiology if appropriate.

## 2016-05-12 MED ORDER — PROPRANOLOL HCL 10 MG PO TABS: 10.0000 mg | ORAL_TABLET | Freq: Three times a day (TID) | ORAL | 0 refills | Status: DC | PRN

## 2016-05-12 MED ORDER — OMEPRAZOLE 40 MG PO CPDR: 40.0000 mg | DELAYED_RELEASE_CAPSULE | Freq: Every day | ORAL | 2 refills | Status: DC

## 2016-05-13 ENCOUNTER — Ambulatory Visit (INDEPENDENT_AMBULATORY_CARE_PROVIDER_SITE_OTHER): Payer: 59 | Admitting: Family

## 2016-05-13 ENCOUNTER — Encounter: Payer: Self-pay | Admitting: Family

## 2016-05-13 VITALS — BP 110/68 | HR 54 | Temp 98.0°F | Resp 16 | Ht 75.0 in | Wt 226.0 lb

## 2016-05-13 DIAGNOSIS — R002 Palpitations: Secondary | ICD-10-CM | POA: Diagnosis not present

## 2016-05-13 NOTE — Patient Instructions (Signed)
Thank you for choosing [facility].  SUMMARY AND INSTRUCTIONS:  Medication:  Continue to take your propranolol as needed.   Your prescription(s) have been submitted to your pharmacy or been printed and provided for you. Please take as directed and contact our office if you believe you are having problem(s) with the medication(s) or have any questions.  Follow up:  If your symptoms worsen or fail to improve, please contact our office for further instruction, or in case of emergency go directly to the emergency room at the closest medical facility.     Holter Monitoring Introduction A Holter monitor is a small device that is used to detect abnormal heart rhythms. It clips to your clothing and is connected by wires to flat, sticky disks (electrodes) that attach to your chest. It is worn continuously for 24-48 hours. Follow these instructions at home:  Wear your Holter monitor at all times, even while exercising and sleeping, for as long as directed by your health care provider.  Make sure that the Holter monitor is safely clipped to your clothing or close to your body as recommended by your health care provider.  Do not get the monitor or wires wet.  Do not put body lotion or moisturizer on your chest.  Keep your skin clean.  Keep a diary of your daily activities, such as walking and doing chores. If you feel that your heartbeat is abnormal or that your heart is fluttering or skipping a beat:  Record what you are doing when it happens.  Record what time of day the symptoms occur.  Return your Holter monitor as directed by your health care provider.  Keep all follow-up visits as directed by your health care provider. This is important. Get help right away if:  You feel lightheaded or you faint.  You have trouble breathing.  You feel pain in your chest, upper arm, or jaw.  You feel sick to your stomach and your skin is pale, cool, or damp.  You heartbeat feels  unusual or abnormal. This information is not intended to replace advice given to you by your health care provider. Make sure you discuss any questions you have with your health care provider. Document Released: 12/11/2003 Document Revised: 08/20/2015 Document Reviewed: 10/21/2013  2017 Elsevier

## 2016-05-13 NOTE — Assessment & Plan Note (Signed)
Previous a diagnosed with heart palpitations and noted increased frequency and is symptomatic with dizziness on occasion. In office EKG shows sinus bradycardia. Cardiac exam is normal. Obtain Holter monitor. Continue current dosage of propranolol as needed. Follow-up and additional treatment pending culture monitor results.

## 2016-05-13 NOTE — Progress Notes (Signed)
Subjective:    Patient ID: Joshua Li, male    DOB: May 09, 1960, 56 y.o.   MRN: [id number]  Chief Complaint  Patient presents with  . Palpitations    since 2010 has had irregular heartbeat, seems like lately have been more frequent and lasting longer    HPI:  Joshua Li is a 56 y.o. male who  has a past medical history of Allergy; Asthma; GERD (gastroesophageal reflux disease); Hiatal hernia; Hyperlipidemia; and Ulcerative colitis. and presents today for an office visit.   Heart palpitations - previously diagnosed with heart palpitations and currently maintained on propranolol. Reports taking the medications as prescribed and only when he is having symptoms. Symptoms can last for a few minutes at a time up to a day at a time. Frequency has increased recently to about once per week. There is some lightheadedness and dizziness when it occurs. No current chest pain, paroxysmal nocturnal dyspnea.   No Known Allergies    Outpatient Medications Prior to Visit  Medication Sig Dispense Refill  . aspirin 81 MG tablet Take 81 mg by mouth daily.      . budesonide (PULMICORT) 180 MCG/ACT inhaler Inhale 2 puffs into the lungs 2 (two) times daily. 1 each 4  . omeprazole (PRILOSEC) 40 MG capsule Take 1 capsule (40 mg total) by mouth daily. 90 capsule 2  . propranolol (INDERAL) 10 MG tablet Take 1 tablet (10 mg total) by mouth 3 (three) times daily as needed. 270 tablet 0  . triamcinolone (NASACORT ALLERGY 24HR) 55 MCG/ACT AERO nasal inhaler Apply 2 sprays to each nostril once daily 1 Inhaler 5   No facility-administered medications prior to visit.      Review of Systems  Constitutional: Negative for chills and fever.  Respiratory: Negative for chest tightness and shortness of breath.   Cardiovascular: Negative for chest pain, palpitations and leg swelling.      Objective:    BP 110/68 (BP Location: Left Arm, Patient Position: Sitting, Cuff Size: Large)   Pulse (!) 54   Temp 98 F  (36.7 C) (Oral)   Resp 16   Ht 6' 3"  (1.905 m)   Wt 226 lb (102.5 kg)   SpO2 96%   BMI 28.25 kg/m  Nursing note and vital signs reviewed.  Physical Exam  Constitutional: He is oriented to person, place, and time. He appears well-developed and well-nourished. No distress.  Cardiovascular: Regular rhythm, normal heart sounds and intact distal pulses.  Bradycardia present.  Exam reveals no gallop and no friction rub.   No murmur heard. Pulmonary/Chest: Effort normal and breath sounds normal.  Neurological: He is alert and oriented to person, place, and time.  Skin: Skin is warm and dry.  Psychiatric: He has a normal mood and affect. His behavior is normal. Judgment and thought content normal.       Assessment & Plan:   Problem List Items Addressed This Visit      Other   Heart palpitations - Primary    Previous a diagnosed with heart palpitations and noted increased frequency and is symptomatic with dizziness on occasion. In office EKG shows sinus bradycardia. Cardiac exam is normal. Obtain Holter monitor. Continue current dosage of propranolol as needed. Follow-up and additional treatment pending culture monitor results.      Relevant Orders   EKG 12-Lead (Completed)   Holter monitor - 48 hour       I am having Mr. Bruschi maintain his aspirin, triamcinolone, budesonide, omeprazole, and propranolol.  Follow-up: Return in about 1 month (around 06/10/2016), or if symptoms worsen or fail to improve.  Mauricio Po, FNP

## 2016-05-19 ENCOUNTER — Ambulatory Visit (INDEPENDENT_AMBULATORY_CARE_PROVIDER_SITE_OTHER): Payer: 59

## 2016-05-19 DIAGNOSIS — R002 Palpitations: Secondary | ICD-10-CM | POA: Diagnosis not present

## 2016-07-05 ENCOUNTER — Other Ambulatory Visit: Payer: Self-pay | Admitting: Family

## 2016-12-05 DIAGNOSIS — M545 Low back pain: Secondary | ICD-10-CM | POA: Diagnosis not present

## 2016-12-26 ENCOUNTER — Encounter: Payer: 59 | Admitting: Family

## 2017-01-09 DIAGNOSIS — J018 Other acute sinusitis: Secondary | ICD-10-CM | POA: Diagnosis not present

## 2017-01-14 ENCOUNTER — Other Ambulatory Visit: Payer: Self-pay | Admitting: Family

## 2017-01-23 ENCOUNTER — Encounter: Payer: Self-pay | Admitting: Nurse Practitioner

## 2017-01-23 ENCOUNTER — Ambulatory Visit (INDEPENDENT_AMBULATORY_CARE_PROVIDER_SITE_OTHER): Payer: 59 | Admitting: Nurse Practitioner

## 2017-01-23 VITALS — BP 118/68 | HR 55 | Temp 98.0°F | Resp 16 | Ht 75.0 in | Wt 222.0 lb

## 2017-01-23 DIAGNOSIS — R002 Palpitations: Secondary | ICD-10-CM

## 2017-01-23 DIAGNOSIS — Z23 Encounter for immunization: Secondary | ICD-10-CM

## 2017-01-23 DIAGNOSIS — Z0000 Encounter for general adult medical examination without abnormal findings: Secondary | ICD-10-CM

## 2017-01-23 NOTE — Assessment & Plan Note (Addendum)
Continues to have intermittent palpitations, about once/week. Maintained on propanolol prn palpitations but does not notice improvement. Occasionally feels lightheaded with palpitations. Denies weakness, syncope, chest pain, shortness of breath. Normal stress test 2014, holter monitor 04/2016- rare PVCs, no atrial or ventricular tachycardia. CBC, CMET, TSH ordered today to rule out underlying etiology. Cardiology referral placed.

## 2017-01-23 NOTE — Patient Instructions (Signed)
Please return for lab work when you are fasting  Please work on your diet and exercise as we discussed. Remember half of your plate should be veggies, one-fourth carbs, one-fourth meat, and don't eat meat at every meal. Also, remember to stay away from sugary drinks. I'd like for you to start incorporating exercise into your daily schedule. Start at 10 minutes a day, working up to 30 minutes five times a week.   I have placed a referral to cardiology for your palpitations and GI for your colonscopy. Our office will call you to schedule these appointments.  It was nice to meet you. Thanks for letting me take care of you today :)   Heart-Healthy Eating Plan Heart-healthy meal planning includes:  Limiting unhealthy fats.  Increasing healthy fats.  Making other small dietary changes.  You may need to talk with your doctor or a diet specialist (dietitian) to create an eating plan that is right for you. What types of fat should I choose?  Choose healthy fats. These include olive oil and canola oil, flaxseeds, walnuts, almonds, and seeds.  Eat more omega-3 fats. These include salmon, mackerel, sardines, tuna, flaxseed oil, and ground flaxseeds. Try to eat fish at least twice each week.  Limit saturated fats. ? Saturated fats are often found in animal products, such as meats, butter, and cream. ? Plant sources of saturated fats include palm oil, palm kernel oil, and coconut oil.  Avoid foods with partially hydrogenated oils in them. These include stick margarine, some tub margarines, cookies, crackers, and other baked goods. These contain trans fats. What general guidelines do I need to follow?  Check food labels carefully. Identify foods with trans fats or high amounts of saturated fat.  Fill one half of your plate with vegetables and green salads. Eat 4-5 servings of vegetables per day. A serving of vegetables is: ? 1 cup of raw leafy vegetables. ?  cup of raw or cooked cut-up  vegetables. ?  cup of vegetable juice.  Fill one fourth of your plate with whole grains. Look for the word "whole" as the first word in the ingredient list.  Fill one fourth of your plate with lean protein foods.  Eat 4-5 servings of fruit per day. A serving of fruit is: ? One medium whole fruit. ?  cup of dried fruit. ?  cup of fresh, frozen, or canned fruit. ?  cup of 100% fruit juice.  Eat more foods that contain soluble fiber. These include apples, broccoli, carrots, beans, peas, and barley. Try to get 20-30 g of fiber per day.  Eat more home-cooked food. Eat less restaurant, buffet, and fast food.  Limit or avoid alcohol.  Limit foods high in starch and sugar.  Avoid fried foods.  Avoid frying your food. Try baking, boiling, grilling, or broiling it instead. You can also reduce fat by: ? Removing the skin from poultry. ? Removing all visible fats from meats. ? Skimming the fat off of stews, soups, and gravies before serving them. ? Steaming vegetables in water or broth.  Lose weight if you are overweight.  Eat 4-5 servings of nuts, legumes, and seeds per week: ? One serving of dried beans or legumes equals  cup after being cooked. ? One serving of nuts equals 1 ounces. ? One serving of seeds equals  ounce or one tablespoon.  You may need to keep track of how much salt or sodium you eat. This is especially true if you have high blood pressure.  Talk with your doctor or dietitian to get more information. What foods can I eat? Grains Breads, including [place], white, pita, wheat, raisin, rye, oatmeal, and [place]. Tortillas that are neither fried nor made with lard or trans fat. Low-fat rolls, including hotdog and hamburger buns and English muffins. Biscuits. Muffins. Waffles. Pancakes. Light popcorn. Whole-grain cereals. Flatbread. Melba toast. Pretzels. Breadsticks. Rusks. Low-fat snacks. Low-fat crackers, including oyster, saltine, matzo, graham, animal, and rye. Rice  and pasta, including brown rice and pastas that are made with whole wheat. Vegetables All vegetables. Fruits All fruits, but limit coconut. Meats and Other Protein Sources Lean, well-trimmed beef, veal, pork, and lamb. Chicken and [place] without skin. All fish and shellfish. Wild duck, rabbit, pheasant, and venison. Egg whites or low-cholesterol egg substitutes. Dried beans, peas, lentils, and tofu. Seeds and most nuts. Dairy Low-fat or nonfat cheeses, including ricotta, string, and mozzarella. Skim or 1% milk that is liquid, powdered, or evaporated. Buttermilk that is made with low-fat milk. Nonfat or low-fat yogurt. Beverages Mineral water. Diet carbonated beverages. Sweets and Desserts Sherbets and fruit ices. Honey, jam, marmalade, jelly, and syrups. Meringues and gelatins. Pure sugar candy, such as hard candy, jelly beans, gumdrops, mints, marshmallows, and small amounts of dark chocolate. [employer] Eat all sweets and desserts in moderation. Fats and Oils Nonhydrogenated (trans-free) margarines. Vegetable oils, including soybean, sesame, sunflower, olive, peanut, safflower, corn, canola, and cottonseed. Salad dressings or mayonnaise made with a vegetable oil. Limit added fats and oils that you use for cooking, baking, salads, and as spreads. Other Cocoa powder. Coffee and tea. All seasonings and condiments. The items listed above may not be a complete list of recommended foods or beverages. Contact your dietitian for more options. What foods are not recommended? Grains Breads that are made with saturated or trans fats, oils, or whole milk. Croissants. Butter rolls. Cheese breads. Sweet rolls. Donuts. Buttered popcorn. Chow mein noodles. High-fat crackers, such as cheese or butter crackers. Meats and Other Protein Sources Fatty meats, such as hotdogs, short ribs, sausage, spareribs, bacon, rib eye roast or steak, and mutton. High-fat deli meats, such as salami and bologna. Caviar.  Domestic duck and goose. Organ meats, such as kidney, liver, sweetbreads, and heart. Dairy Cream, sour cream, cream cheese, and creamed cottage cheese. Whole-milk cheeses, including blue (bleu), Monte[place] Jack, [place], [place], American, [place], Swiss, cheddar, [place], and [place]. Whole or 2% milk that is liquid, evaporated, or condensed. Whole buttermilk. Cream sauce or high-fat cheese sauce. Yogurt that is made from whole milk. Beverages Regular sodas and juice drinks with added sugar. Sweets and Desserts Frosting. Pudding. Cookies. Cakes other than angel food cake. Candy that has milk chocolate or white chocolate, hydrogenated fat, butter, coconut, or unknown ingredients. Buttered syrups. Full-fat ice cream or ice cream drinks. Fats and Oils Gravy that has suet, meat fat, or shortening. Cocoa butter, hydrogenated oils, palm oil, coconut oil, palm kernel oil. These can often be found in baked products, candy, fried foods, nondairy creamers, and whipped toppings. Solid fats and shortenings, including bacon fat, salt pork, lard, and butter. Nondairy cream substitutes, such as coffee creamers and sour cream substitutes. Salad dressings that are made of unknown oils, cheese, or sour cream. The items listed above may not be a complete list of foods and beverages to avoid. Contact your dietitian for more information. This information is not intended to replace advice given to you by your health care provider. Make sure you discuss any questions you have with your health  care provider. Document Released: 09/13/2011 Document Revised: 08/20/2015 Document Reviewed: 09/05/2013 Elsevier Interactive Patient Education  [employer].

## 2017-01-23 NOTE — Progress Notes (Signed)
Subjective:    Patient ID: Joshua Li, male    DOB: 08/14/1960, 56 y.o.   MRN: [id number]  HPI Joshua Li is a 56 yo male Who presents today to establish care. He  Is transferring to me from another provider in the same clinic. He is requesting a complete physical exam.  Immunizations: Flu shot today. Tetanus up to date. Diet: Breakfast: 1/2 bagel, eggs, avocado, juice, coffee. Lunch: sandwich, chips. Dinner: more meals at home. Snacks: cheese, crackers, potato chips Exercise: Rare, walks about twice a week. Colonoscopy: referral today. Vision:12/2016  Dental: every 6 months for maintenance cleanings, xrays  Palpitations-Began in 2010. He has palpitations intermittently, usually they last all day once they start. He takes propanolol prn for the palpitations about 1-2 times on the days palpitations occur but usually does not notice improvement. He also tries to cough when the palpitaions occur but this does not improve the palpitations. He occassionally feels lightheaded with the palpitations. He denies weakness, syncope, anxiety, shortness of breath, chest pain. Drinks about 1-2 cups coffee daily. He feels he gets the palpitations the most when he is fatigued. Prior diagnostic testing and notes reviewed. Stress test 2014-normal. Holter Monitor Feb 2018-rare PVCs.   Review of Systems  Constitutional: Negative for activity change and appetite change.  HENT: Positive for congestion. Negative for hearing loss and trouble swallowing.   Eyes: Negative for visual disturbance.  Respiratory: Positive for cough. Negative for shortness of breath.   Cardiovascular: Negative for chest pain and leg swelling.  Gastrointestinal: Negative for constipation and diarrhea.  Endocrine: Negative for polydipsia, polyphagia and polyuria.  Genitourinary: Negative for difficulty urinating and dysuria.  Musculoskeletal: Positive for arthralgias, back pain and myalgias.  Skin: Negative for rash.    Allergic/Immunologic: Negative for environmental allergies and food allergies.  Neurological: Negative for syncope and weakness.  Hematological: Negative for adenopathy. Does not bruise/bleed easily.  Psychiatric/Behavioral:       Denies anxiety or depression      Past Medical History:  Diagnosis Date  . Allergy   . Asthma   . GERD (gastroesophageal reflux disease)   . Hiatal hernia   . Hyperlipidemia   . Ulcerative colitis      Social History   Social History  . Marital status: Divorced    Spouse name: N/A  . Number of children: 1  . Years of education: 8   Occupational History  . Retail    Social History Main Topics  . Smoking status: Never Smoker  . Smokeless tobacco: Never Used  . Alcohol use 1.2 oz/week    2 Cans of beer per week  . Drug use: No  . Sexual activity: Yes   Other Topics Concern  . Not on file   Social History Narrative   Fun: [employer]   Denies religious beliefs effecting health care.     Past Surgical History:  Procedure Laterality Date  . CHOLECYSTECTOMY    . COLECTOMY  2003   with one step take-down to J-pouch  . TONSILLECTOMY      Family History  Problem Relation Age of Onset  . Cancer Mother        breast and ovarian  . Cancer Father        stomach & bladder  . Heart disease Father     No Known Allergies  Current Outpatient Prescriptions on File Prior to Visit  Medication Sig Dispense Refill  . aspirin 81 MG tablet Take 81 mg by mouth daily.      Marland Kitchen  omeprazole (PRILOSEC) 40 MG capsule Take 1 capsule (40 mg total) by mouth daily. 90 capsule 2  . propranolol (INDERAL) 10 MG tablet TAKE 1 TABLET BY MOUTH 3  TIMES DAILY AS NEEDED 270 tablet 1   No current facility-administered medications on file prior to visit.     BP 118/68 (BP Location: Left Arm, Patient Position: Sitting, Cuff Size: Large)   Pulse (!) 55   Temp 98 F (36.7 C) (Oral)   Resp 16   Ht 6' 3"  (1.905 m)   Wt 222 lb (100.7 kg)   SpO2 98%   BMI 27.75  kg/m       Objective:   Physical Exam Constitutional: He is oriented to person, place, and time. He appears well-developed and well-nourished. No distress.  HENT:  Head: Normocephalic and atraumatic.  Right Ear: Tympanic membrane and ear canal normal. Visible bony landmarks. Left Ear: Tympanic membrane and ear canal normal. Visible bony landmarks. Mouth/Throat: Oropharynx is clear and moist.  Eyes: Pupils are equal, round, and reactive to light. EOMs intact. No scleral icterus.  Neck: Normal range of motion. No thyromegaly present.  Cardiovascular: Regular rhythm. No murmur heard. Pulmonary/Chest: Effort normal and breath sounds normal. No respiratory distress. He has no wheezes. He has no rales. He exhibits no tenderness.  Abdominal: Soft. Bowel sounds are normal. He exhibits no distension and no mass. There is no tenderness. There is no rebound and no guarding.  Musculoskeletal: He exhibits no edema.  Lymphadenopathy:    He has no cervical adenopathy.  Neurological: He is alert and oriented to person, place, and time. He has normal patellar reflexes. He exhibits normal muscle tone. Coordination normal. Cranial nerves intact. Skin: Skin is warm and dry.  Psychiatric: He has a normal mood and affect. His behavior is normal. Judgment and thought content normal.      Assessment & Plan:

## 2017-01-23 NOTE — Assessment & Plan Note (Signed)
Flu shot given today. Colonoscopy referral placed. HIV screening ordered. Health maintenance up to date.  Lipid panel, PSA ordered for routine screening. Hell return when fasting for labs.  Heathy diet and exercise discussed. Handout given.

## 2017-01-27 ENCOUNTER — Other Ambulatory Visit (INDEPENDENT_AMBULATORY_CARE_PROVIDER_SITE_OTHER): Payer: 59

## 2017-01-27 DIAGNOSIS — Z125 Encounter for screening for malignant neoplasm of prostate: Secondary | ICD-10-CM

## 2017-01-27 DIAGNOSIS — Z0000 Encounter for general adult medical examination without abnormal findings: Secondary | ICD-10-CM

## 2017-01-27 DIAGNOSIS — R002 Palpitations: Secondary | ICD-10-CM | POA: Diagnosis not present

## 2017-01-27 LAB — LIPID PANEL
Cholesterol: 169 mg/dL (ref 0–200)
HDL: 52 mg/dL (ref >39.00)
LDL Cholesterol: 94 mg/dL (ref 0–99)
NONHDL: 116.72
Total CHOL/HDL Ratio: 3
Triglycerides: 115 mg/dL (ref 0.0–149.0)
VLDL: 23 mg/dL (ref 0.0–40.0)

## 2017-01-27 LAB — COMPREHENSIVE METABOLIC PANEL
ALK PHOS: 50 U/L (ref 39–117)
ALT: 28 U/L (ref 0–53)
AST: 19 U/L (ref 0–37)
Albumin: 4.1 g/dL (ref 3.5–5.2)
BILIRUBIN TOTAL: 1 mg/dL (ref 0.2–1.2)
BUN: 20 mg/dL (ref 6–23)
CALCIUM: 9.4 mg/dL (ref 8.4–10.5)
CO2: 29 mEq/L (ref 19–32)
Chloride: 105 mEq/L (ref 96–112)
Creatinine, Ser: 1.02 mg/dL (ref 0.40–1.50)
GFR: 79.7 mL/min (ref >60.00)
GLUCOSE: 103 mg/dL — AB (ref 70–99)
Potassium: 4.1 mEq/L (ref 3.5–5.1)
Sodium: 141 mEq/L (ref 135–145)
TOTAL PROTEIN: 6.9 g/dL (ref 6.0–8.3)

## 2017-01-27 LAB — PSA: PSA: 1.33 ng/mL (ref 0.10–4.00)

## 2017-01-27 LAB — CBC
HCT: 44.7 % (ref 39.0–52.0)
Hemoglobin: 15.6 g/dL (ref 13.0–17.0)
MCHC: 34.9 g/dL (ref 30.0–36.0)
MCV: 88.8 fl (ref 78.0–100.0)
Platelets: 236 10*3/uL (ref 150.0–400.0)
RBC: 5.04 Mil/uL (ref 4.22–5.81)
RDW: 13.1 % (ref 11.5–15.5)
WBC: 7 10*3/uL (ref 4.0–10.5)

## 2017-01-27 LAB — TSH: TSH: 1.45 u[IU]/mL (ref 0.35–4.50)

## 2017-01-28 LAB — HIV ANTIBODY (ROUTINE TESTING W REFLEX): HIV 1&2 Ab, 4th Generation: NONREACTIVE

## 2017-02-02 ENCOUNTER — Encounter: Payer: Self-pay | Admitting: Gastroenterology

## 2017-02-08 ENCOUNTER — Encounter: Payer: Self-pay | Admitting: Cardiology

## 2017-02-08 ENCOUNTER — Ambulatory Visit: Payer: 59 | Admitting: Cardiology

## 2017-02-08 VITALS — BP 120/78 | HR 52 | Ht 75.0 in | Wt 227.0 lb

## 2017-02-08 DIAGNOSIS — R55 Syncope and collapse: Secondary | ICD-10-CM | POA: Diagnosis not present

## 2017-02-08 DIAGNOSIS — R002 Palpitations: Secondary | ICD-10-CM

## 2017-02-08 NOTE — Patient Instructions (Addendum)
NO MEDICATION CHANGES CONTINUE TO USE INDERAL AS NEEDED    KEEP HYDRATE DRINK AT LEAST 8-10 GLASSES OF 8 OZ OF WATER A DAY  URINE SHOULD BE CLEAR.    SCHEDULE AT [place] has recommended that you wear an event monitor 30 DAYS. Event monitors are medical devices that record the heart's electrical activity. Doctors most often [place] these monitors to diagnose arrhythmias. Arrhythmias are problems with the speed or rhythm of the heartbeat. The monitor is a small, portable device. You can wear one while you do your normal daily activities. This is usually used to diagnose what is causing palpitations/syncope (passing out).     Your physician recommends that you schedule a follow-up appointment in 2 [place].

## 2017-02-08 NOTE — Progress Notes (Addendum)
PCP: Lance Sell, NP  Clinic Note: Chief Complaint  Patient presents with  . New Patient (Initial Visit)    Tachypalpitations    HPI: Joshua Li is a 56 y.o. male who is being seen today for the evaluation of rapid heart rate/palpitations at the request of Lance Sell, NP.  Joshua Li was seen on October 29 by Lance Sell, NP in order to establish care and transitioning providers.  As part of his evaluation he noted that he has been having intermittent episodes of rapid heartbeat palpitations spells off and on for about the last 8 years.  He was therefore referred for cardiology evaluation.  Recent Hospitalizations: None  Studies Personally Reviewed - (if available, images/films reviewed: From Epic Chart or Care Everywhere)  April 2014: Myoview: No ischemia or infarction.  Normal.  February 2018, 48-hour Holter Monitor: Noted only rare PVCs.  Otherwise sinus rhythm, sinus arrhythmia and sinus tachycardia.  No arrhythmias.  Interval History: Joshua Li presents today to discuss these palpitations spells unfortunately he has not really been had one since he saw his new PCP. Several years ago he was started on propranolol (started by his prior PCP) that he takes as needed.  He states that the beta-blocker does help some, but not really --he may be takes propranolol about 2-3 times a week.  He says he really has no rhyme or reason to when these happen.  First time it was about May 2010.  Again in 2014 they recurred..  But however starting from about February of this year he has been noticing them more frequently. He wore a 48-hour monitor once, but did not capture anything, because he did have an episode.  He really says that he these episodes may happen 2 or 3 times a week, but can last 30 minutes to an hour.  They can last several hours on occasion.  He has tried vagal maneuvers to no avail.  He only drinks 1 or 2 cups of coffee a day and does not drink any  sodas.  The episodes almost always occur at rest or not really associated with anything besides a little little bit of dizziness and a little discomfort in his chest.  If it gets really fast, he would note some dyspnea as well.  He has not had any syncope or near syncope however.  No TIA or amaurosis fugax symptoms. When not having these symptoms he denies any chest tightness or pressure with rest or exertion.  No PND, orthopnea or edema.  No resting or exertional dyspnea. No chest pain or shortness of breath with rest or exertion. No PND, orthopnea or edema. No palpitations, lightheadedness, dizziness, weakness or syncope/near syncope. No TIA/amaurosis fugax symptoms.. No claudication.  ROS: A comprehensive was performed. Review of Systems  Constitutional: Negative for malaise/fatigue (He only feels tired when he has the palpitations) and weight loss.  HENT: Negative for nosebleeds.   Respiratory: Negative for cough and shortness of breath.   Cardiovascular:       Per HPI  Gastrointestinal: Negative for blood in stool and melena.  Genitourinary: Negative for dysuria and hematuria.  Musculoskeletal: Negative for joint pain and myalgias.  Neurological: Negative for dizziness.  Endo/Heme/Allergies: Does not bruise/bleed easily.  Psychiatric/Behavioral: Negative for memory loss. The patient is not nervous/anxious and does not have insomnia.   All other systems reviewed and are negative.  I have reviewed and (if needed) personally updated the patient's problem list, medications, allergies, past medical  and surgical history, social and family history.   Past Medical History:  Diagnosis Date  . Allergy   . Asthma   . GERD (gastroesophageal reflux disease)   . Hiatal hernia   . Hyperlipidemia   . Ulcerative colitis 2001   Status post total colectomy with J-pouch in 2003 -     Past Surgical History:  Procedure Laterality Date  . CHOLECYSTECTOMY    . COLECTOMY  2003   with one step  take-down to J-pouch  . TONSILLECTOMY      Current Meds  Medication Sig  . aspirin 81 MG tablet Take 81 mg by mouth daily.    Marland Kitchen loratadine (CLARITIN) 10 MG tablet Take 10 mg daily as needed by mouth for allergies.  Marland Kitchen propranolol (INDERAL) 10 MG tablet TAKE 1 TABLET BY MOUTH 3  TIMES DAILY AS NEEDED  . [DISCONTINUED] omeprazole (PRILOSEC) 40 MG capsule Take 1 capsule (40 mg total) by mouth daily.    No Known Allergies  Social History   Socioeconomic History  . Marital status: Divorced    Spouse name: None  . Number of children: 1  . Years of education: 37  . Highest education level: Bachelor's degree (e.g., BA, AB, BS)  Social Needs  . Financial resource strain: None  . Food insecurity - worry: None  . Food insecurity - inability: None  . Transportation needs - medical: None  . Transportation needs - non-medical: None  Occupational History  . Occupation: Retail    Comment: [employer]  Tobacco Use  . Smoking status: Never Smoker  . Smokeless tobacco: Never Used  Substance and Sexual Activity  . Alcohol use: Yes    Alcohol/week: 1.2 oz    Types: 2 Cans of beer per week  . Drug use: No  . Sexual activity: Yes  Other Topics Concern  . None  Social History Narrative   Fun: Geocaching   Denies religious beliefs effecting health care.     family history includes Cancer in his mother; Heart attack (age of onset: 36) in his father; Lymphoma (age of onset: 57) in his brother; Stomach cancer (age of onset: 70) in his father.  Wt Readings from Last 3 Encounters:  02/08/17 227 lb (103 kg)  01/23/17 222 lb (100.7 kg)  05/13/16 226 lb (102.5 kg)    PHYSICAL EXAM BP 120/78   Pulse (!) 52   Ht 6' 3"  (1.905 m)   Wt 227 lb (103 kg)   BMI 28.37 kg/m  Physical Exam  Constitutional: He is oriented to person, place, and time. He appears well-developed and well-nourished. No distress.  Relatively healthy appearing  HENT:  Head: Normocephalic and atraumatic.  Mouth/Throat: No  oropharyngeal exudate.  Eyes: Conjunctivae and EOM are normal. Pupils are equal, round, and reactive to light. No scleral icterus.  Wears glasses  Neck: Normal range of motion. Neck supple. No hepatojugular reflux and no JVD present. Carotid bruit is not present.  Cardiovascular: Regular rhythm, normal heart sounds and intact distal pulses.  No extrasystoles are present. Bradycardia present. PMI is not displaced. Exam reveals no gallop and no friction rub.  No murmur heard. Pulmonary/Chest: Effort normal and breath sounds normal. No respiratory distress. He has no wheezes.  Abdominal: Soft. Bowel sounds are normal. He exhibits no distension.  Musculoskeletal: Normal range of motion. He exhibits no edema or deformity.  Neurological: He is alert and oriented to person, place, and time. No cranial nerve deficit.  Skin: Skin is warm and dry. No  rash noted. No erythema.  Psychiatric: He has a normal mood and affect. His behavior is normal. Judgment and thought content normal.  Nursing note and vitals reviewed.    Adult ECG Report  Rate: 52;  Rhythm: sinus bradycardia and Otherwise normal axis, intervals and durations.  Normal ST segments.;   Narrative Interpretation: Relatively normal EKG.   Other studies Reviewed: Additional studies/ records that were reviewed today include:  Recent Labs:   Lab Results  Component Value Date   CREATININE 1.02 01/27/2017   BUN 20 01/27/2017   NA 141 01/27/2017   K 4.1 01/27/2017   CL 105 01/27/2017   CO2 29 01/27/2017   Lab Results  Component Value Date   CHOL 169 01/27/2017   HDL 52.00 01/27/2017   LDLCALC 94 01/27/2017   LDLDIRECT 123.3 03/02/2009   TRIG 115.0 01/27/2017   CHOLHDL 3 01/27/2017   Lab Results  Component Value Date   WBC 7.0 01/27/2017   HGB 15.6 01/27/2017   HCT 44.7 01/27/2017   MCV 88.8 01/27/2017   PLT 236.0 01/27/2017   Lab Results  Component Value Date   TSH 1.45 01/27/2017    ASSESSMENT / PLAN: Problem List  Items Addressed This Visit    Heart palpitations - Primary (Chronic)    He seems to be having enough episodes to be should be a capture a few on a 30-day event monitor.  If that does not work, he would consider purchasing the [place] that allows as needed monitoring of rhythms using the smart phone. He is taking propranolol, and he has resting bradycardia which makes standard dose of beta-blocker unfavorable. So far his labs seem relatively stable.  Plan: 30-day event monitor Discussed importance of adequate hydration.  We also discussed vagal maneuvers.      Relevant Orders   EKG 12-Lead (Completed)   CARDIAC EVENT MONITOR   SYNCOPE    Distant history of syncope in 2011, no recurrence.  That time it was thought to be a vasovagal event not related to tachycardia.      Relevant Orders   EKG 12-Lead (Completed)   CARDIAC EVENT MONITOR      Current medicines are reviewed at length with the patient today. (+/- concerns) n/a The following changes have been made: n/a  Patient Instructions  NO MEDICATION CHANGES CONTINUE TO USE INDERAL AS NEEDED    KEEP HYDRATE DRINK AT LEAST 8-10 GLASSES OF 8 OZ OF WATER A DAY  URINE SHOULD BE CLEAR.    SCHEDULE AT  has recommended that you wear an event monitor 30 DAYS. Event monitors are medical devices that record the heart's electrical activity. Doctors most often [place] these monitors to diagnose arrhythmias. Arrhythmias are problems with the speed or rhythm of the heartbeat. The monitor is a small, portable device. You can wear one while you do your normal daily activities. This is usually used to diagnose what is causing palpitations/syncope (passing out).     Your physician recommends that you schedule a follow-up appointment in 2 [place].      Studies Ordered:   Orders Placed This Encounter  Procedures  . CARDIAC EVENT MONITOR  . EKG 12-Lead       Glenetta Hew, M.D., M.S. Interventional Cardiologist   Pager # [phone] Phone # [phone] [address] [place], [place] [address]

## 2017-02-09 ENCOUNTER — Encounter: Payer: Self-pay | Admitting: Nurse Practitioner

## 2017-02-09 ENCOUNTER — Other Ambulatory Visit: Payer: Self-pay

## 2017-02-09 MED ORDER — OMEPRAZOLE 40 MG PO CPDR: 40.0000 mg | DELAYED_RELEASE_CAPSULE | Freq: Every day | ORAL | 2 refills | Status: DC

## 2017-02-10 ENCOUNTER — Encounter: Payer: Self-pay | Admitting: Cardiology

## 2017-02-10 NOTE — Assessment & Plan Note (Signed)
Distant history of syncope in 2011, no recurrence.  That time it was thought to be a vasovagal event not related to tachycardia.

## 2017-02-10 NOTE — Assessment & Plan Note (Signed)
He seems to be having enough episodes to be should be a capture a few on a 30-day event monitor.  If that does not work, he would consider purchasing the [place] that allows as needed monitoring of rhythms using the smart phone. He is taking propranolol, and he has resting bradycardia which makes standard dose of beta-blocker unfavorable. So far his labs seem relatively stable.  Plan: 30-day event monitor Discussed importance of adequate hydration.  We also discussed vagal maneuvers.

## 2017-02-13 ENCOUNTER — Other Ambulatory Visit: Payer: Self-pay | Admitting: *Deleted

## 2017-02-13 NOTE — Telephone Encounter (Signed)
Rec'd fax pt requesting refill on his Omeprazole 40 mg. Per chart refill was sent to [facility] on 02/09/17.Marland KitchenJohny Chess

## 2017-02-23 ENCOUNTER — Ambulatory Visit (INDEPENDENT_AMBULATORY_CARE_PROVIDER_SITE_OTHER): Payer: 59

## 2017-02-23 DIAGNOSIS — R002 Palpitations: Secondary | ICD-10-CM | POA: Diagnosis not present

## 2017-02-23 DIAGNOSIS — R55 Syncope and collapse: Secondary | ICD-10-CM

## 2017-02-25 ENCOUNTER — Telehealth: Payer: Self-pay | Admitting: Physician Assistant

## 2017-02-25 NOTE — Telephone Encounter (Signed)
Received call from event monitor company for an event of 13-beat run ofo NSVT with underlying rhythm of ST. Pt states he was raking leaves and felt lightheaded and dizzy with racing heart rate.   I called the patient personally and he was asymptomatic, resting from his yard work. In consultation with Dr. Sallyanne Kuster, I advised him to take scheduled propranolol 10 mg BID with a third dose PRN. The patient agreed with this plan. Per Epic, propranolol script was written for TID PRN, so he should have enough pills.

## 2017-02-27 ENCOUNTER — Telehealth: Payer: Self-pay | Admitting: *Deleted

## 2017-02-27 ENCOUNTER — Encounter: Payer: Self-pay | Admitting: Cardiology

## 2017-02-27 DIAGNOSIS — I471 Supraventricular tachycardia: Secondary | ICD-10-CM | POA: Insufficient documentation

## 2017-02-27 DIAGNOSIS — I472 Ventricular tachycardia: Secondary | ICD-10-CM | POA: Insufficient documentation

## 2017-02-27 DIAGNOSIS — I4729 Other ventricular tachycardia: Secondary | ICD-10-CM | POA: Insufficient documentation

## 2017-02-27 NOTE — Telephone Encounter (Signed)
Received recommendations from Dr. Ellyn Hack - option to either bring patient in tomorrow to discuss plan, or go ahead and order echo & stress test and have patient return to see him some time next week.   Dr. Ellyn Hack has availability tomorrow AM. I have held appt slot, called pt but call went to VM - left msg to call back.

## 2017-02-27 NOTE — Telephone Encounter (Signed)
Pt of Dr. Ellyn Hack  Seen for OV 11/14 concerning palpitations  A 30 day event monitor was placed 11/29   We received incoming faxes for several event reports for the monitor, which occurred over weekend - mainly interpreted as short runs of atrial fib or flutter w variable conduction, & PVCs. He did have one 13 sec run of v-tach which patient reported his symptoms for & received call back on, see telephone note by on-call PA.  I called to speak to patient, he notes he got a call over weekend from on-call PA. States he did have a lot of episodes on Saturday, but overall nothing out of the ordinary. He'd reported problem of palpitations which was rationale for his visit on 11/14. He'd worn a 48 hour holter in the past, which didn't show any irregular readings. Pt states he's doing well today and does not note any new concerns. He has f/u in January to discuss event monitor with Dr. Ellyn Hack   Informed him I'd submit event reports to MD for review and contact him w any advice. For now, recommended he continue w the propranolol use as advised & that I will call him back if any other recommendations. He verbalized understanding and thanks.

## 2017-02-27 NOTE — Telephone Encounter (Signed)
Additional call to patient made, goes to VM.

## 2017-02-27 NOTE — Telephone Encounter (Signed)
Spoke to patient, agreeable to option of seeing Dr. Ellyn Hack for OV to discuss 1st, states he has workers coming to house tomorrow in AM but could do afternoon appt. sched for 4pm 12/4, pt aware to call in interim if any questions or concerns.

## 2017-02-28 ENCOUNTER — Encounter: Payer: Self-pay | Admitting: Cardiology

## 2017-02-28 ENCOUNTER — Ambulatory Visit: Payer: 59 | Admitting: Cardiology

## 2017-02-28 VITALS — BP 118/80 | HR 54 | Ht 75.0 in | Wt 223.0 lb

## 2017-02-28 DIAGNOSIS — I471 Supraventricular tachycardia: Secondary | ICD-10-CM

## 2017-02-28 DIAGNOSIS — I472 Ventricular tachycardia: Secondary | ICD-10-CM | POA: Diagnosis not present

## 2017-02-28 DIAGNOSIS — E782 Mixed hyperlipidemia: Secondary | ICD-10-CM

## 2017-02-28 DIAGNOSIS — R55 Syncope and collapse: Secondary | ICD-10-CM

## 2017-02-28 DIAGNOSIS — I4729 Other ventricular tachycardia: Secondary | ICD-10-CM

## 2017-02-28 NOTE — Progress Notes (Signed)
PCP: Lance Sell, NP  Clinic Note: Chief Complaint  Patient presents with  . Follow-up    Still wearing the monitor.  Multiple events noted  . Headache    HPI: Joshua Li is a 56 y.o. male who is being seen today for follow-up evaluation of rapid heart rate/palpitations at the request of Lance Sell, NP.  TREON KEHL was seen on October 29 by Lance Sell, NP in order to establish care and transitioning providers.  As part of his evaluation he noted that he has been having intermittent episodes of rapid heartbeat palpitations spells off and on for about the last 8 years.  He was therefore referred for cardiology evaluation.  I saw him initially on November 14.  We ordered a 30 event monitor.  This has shown several different episodes of PAT, frequent PVCs, possible atrial flutter as well as a pause and several short runs of NSVT.  Recent Hospitalizations: None  Studies Personally Reviewed - (if available, images/films reviewed: From Epic Chart or Care Everywhere)  Current event monitor multiple episodes.  Most notable was an episode of what appears to be PSVT or PAT lasting about 20 beats with a rate of 170 288 bpm.  There were some either PVCs or aberrant conducted beats noted.  Another notable episode was a similarly long episode of wide-complex tachycardia that was either nonsustained V. tach versus PAT with aberrancy.  Cannot fully detect P waves.  He definitely noted the episodes on the first and 2 December.  He felt quite bad during the day while these were occurring.  Interval History: Nori Riis presents today to discuss the findings on his event monitor.  He actually indicates that he was called by the monitor company at least twice.  He has not had syncope or near syncope, but has definitely noted the palpitations.  He does not note any chest tightness pressure associated with it.  He did however get a little bit lightheaded. He still drinks coffee, but  has tried to cut down on the additional cup in the afternoon.  He has not yet had an episode long enough to use a dose of propranolol.  However when the prolonged episodes of nonsustained VT occurred over the weekend, he was told to go ahead and start taking propranolol twice daily.  He does indicate however since starting the [place], the palpitations have improved notably.  Cardiovascular Review Of Symptoms : no chest pain or dyspnea on exertion positive for - irregular heartbeat and palpitations negative for - edema, murmur, orthopnea, paroxysmal nocturnal dyspnea, shortness of breath or Syncope/near technique, TIA/amaurosis fugax   ROS: A comprehensive was performed. Review of Systems  Constitutional: Negative for malaise/fatigue (He only feels tired when he has the palpitations) and weight loss.  HENT: Negative for nosebleeds.   Cardiovascular:       Per HPI  Gastrointestinal: Negative for blood in stool and melena.  Genitourinary: Negative for dysuria and hematuria.  Musculoskeletal: Positive for myalgias. Negative for falls.  Neurological: Negative for dizziness.  Endo/Heme/Allergies: Does not bruise/bleed easily.  Psychiatric/Behavioral: The patient is not nervous/anxious and does not have insomnia.   All other systems reviewed and are negative.  I have reviewed and (if needed) personally updated the patient's problem list, medications, allergies, past medical and surgical history, social and family history.   Past Medical History:  Diagnosis Date  . Allergy   . Asthma   . GERD (gastroesophageal reflux disease)   . Hiatal  hernia   . Hyperlipidemia   . Ulcerative colitis 2001   Status post total colectomy with J-pouch in 2003 -     Past Surgical History:  Procedure Laterality Date  . CHOLECYSTECTOMY    . COLECTOMY  2003   with one step take-down to J-pouch  . TONSILLECTOMY      Current Meds  Medication Sig  . aspirin 81 MG tablet Take 81 mg by mouth daily.    Marland Kitchen  loratadine (CLARITIN) 10 MG tablet Take 10 mg daily as needed by mouth for allergies.  Marland Kitchen omeprazole (PRILOSEC) 40 MG capsule Take 1 capsule (40 mg total) daily by mouth.  . propranolol (INDERAL) 10 MG tablet TAKE 1 TABLET BY MOUTH 3  TIMES DAILY AS NEEDED    No Known Allergies  Social History   Socioeconomic History  . Marital status: Divorced    Spouse name: None  . Number of children: 1  . Years of education: 40  . Highest education level: Bachelor's degree (e.g., BA, AB, BS)  Social Needs  . Financial resource strain: None  . Food insecurity - worry: None  . Food insecurity - inability: None  . Transportation needs - medical: None  . Transportation needs - non-medical: None  Occupational History  . Occupation: Retail    Comment: [employer]  Tobacco Use  . Smoking status: Never Smoker  . Smokeless tobacco: Never Used  Substance and Sexual Activity  . Alcohol use: Yes    Alcohol/week: 1.2 oz    Types: 2 Cans of beer per week  . Drug use: No  . Sexual activity: Yes  Other Topics Concern  . None  Social History Narrative   Fun: Geocaching   Denies religious beliefs effecting health care.     family history includes Cancer in his mother; Heart attack (age of onset: 31) in his father; Lymphoma (age of onset: 44) in his brother; Stomach cancer (age of onset: 49) in his father.  Wt Readings from Last 3 Encounters:  02/28/17 223 lb (101.2 kg)  02/08/17 227 lb (103 kg)  01/23/17 222 lb (100.7 kg)    PHYSICAL EXAM BP 118/80   Pulse (!) 54   Ht 6' 3"  (1.905 m)   Wt 223 lb (101.2 kg)   BMI 27.87 kg/m  Physical Exam  Constitutional: He is oriented to person, place, and time. He appears well-developed and well-nourished. No distress.  Relatively healthy appearing  HENT:  Head: Normocephalic and atraumatic.  Mouth/Throat: No oropharyngeal exudate.  Eyes: EOM are normal.  Wears glasses  Neck: Normal range of motion. Neck supple. No hepatojugular reflux and no JVD present.  Carotid bruit is not present.  Cardiovascular: Regular rhythm, normal heart sounds and intact distal pulses.  No extrasystoles are present. Bradycardia present. PMI is not displaced. Exam reveals no gallop and no friction rub.  No murmur heard. Pulmonary/Chest: Effort normal and breath sounds normal. No respiratory distress. He has no wheezes.  Musculoskeletal: Normal range of motion. He exhibits no edema or deformity.  Neurological: He is alert and oriented to person, place, and time. No cranial nerve deficit.  Skin: No rash noted. No erythema.  Psychiatric: He has a normal mood and affect. His behavior is normal. Judgment and thought content normal.  Nursing note and vitals reviewed.    Adult ECG Report    Other studies Reviewed: Additional studies/ records that were reviewed today include:  Recent Labs:   Lab Results  Component Value Date   CREATININE 1.02  01/27/2017   BUN 20 01/27/2017   NA 141 01/27/2017   K 4.1 01/27/2017   CL 105 01/27/2017   CO2 29 01/27/2017   Lab Results  Component Value Date   CHOL 169 01/27/2017   HDL 52.00 01/27/2017   LDLCALC 94 01/27/2017   LDLDIRECT 123.3 03/02/2009   TRIG 115.0 01/27/2017   CHOLHDL 3 01/27/2017   Lab Results  Component Value Date   WBC 7.0 01/27/2017   HGB 15.6 01/27/2017   HCT 44.7 01/27/2017   MCV 88.8 01/27/2017   PLT 236.0 01/27/2017   Lab Results  Component Value Date   TSH 1.45 01/27/2017    ASSESSMENT / PLAN: Problem List Items Addressed This Visit    Hyperlipidemia, unspecified    He has a diagnosis of hyperlipidemia listed, but is not currently on any medications.  With concern for ischemic evaluation, if there is evidence of significant coronary disease would need to be more aggressive with lipid control.  We will need to get baseline lab value upon his return.      Near syncope (Chronic)    Has had no episodes since 2011.  However, it would be possible that 1 of these arrhythmias could be the  etiology.      Relevant Orders   ECHOCARDIOGRAM COMPLETE   Non-sustained ventricular tachycardia (HCC) - 5-20 beat runs    Several episodes of 5-20 beat runs of nonsustained V. tach.  This is quite concerning.  We will ask the elective physiology colleagues to review the strips with me to determine if the rhythm is truly sustained V. tach versus PAT or SVT with aberrancy.  Electrolytes are relatively normal. However regardless with the extent of ectopy, we will check a 2D echocardiogram for structural evaluation.  Depending on what this shows, if the EF is preserved, would proceed with a Myoview stress test.  However if the EF is reduced, we would then likely consider a more definitive ischemic evaluation with either coronary CT angiogram, or if bad enough, cardiac catheterization.  Continue beta-blocker for now.  Continue taking beta-blocker for now.      Relevant Orders   ECHOCARDIOGRAM COMPLETE   PAT (paroxysmal atrial tachycardia) (HCC) - vs. >? Atrial Flutter - Primary    The marker was hard to read and is difficult to tell if it was treated PAT versus flutter.  I will await the Holter monitor report and review with electrophysiology team.  If it is indeed atrial flutter, we would need to discuss risks associated including stroke.      Relevant Orders   ECHOCARDIOGRAM COMPLETE      Current medicines are reviewed at length with the patient today. (+/- concerns) n/a The following changes have been made: n/a  Patient Instructions  No medication changes     Schedule at [address]  [place] has requested that you have an echocardiogram. Echocardiography is a painless test that uses sound waves to create images of your heart. It provides your doctor with information about the size and shape of your heart and how well your heart's chambers and valves are working. This procedure takes approximately one hour. There are no restrictions for this  procedure.     Your physician recommends that you  Keep schedule  follow-up appointment on Apr 24 2017.       Studies Ordered:   Orders Placed This Encounter  Procedures  . ECHOCARDIOGRAM COMPLETE      Glenetta Hew, M.D.,  M.S. Interventional Cardiologist   Pager # [phone] Phone # [phone] [address] [place], [place] [address]

## 2017-02-28 NOTE — Patient Instructions (Addendum)
No medication changes     Schedule at [address]  [place] has requested that you have an echocardiogram. Echocardiography is a painless test that uses sound waves to create images of your heart. It provides your doctor with information about the size and shape of your heart and how well your heart's chambers and valves are working. This procedure takes approximately one hour. There are no restrictions for this procedure.     Your physician recommends that you  Keep schedule  follow-up appointment on Apr 24 2017.

## 2017-03-01 ENCOUNTER — Encounter: Payer: Self-pay | Admitting: Cardiology

## 2017-03-01 NOTE — Assessment & Plan Note (Signed)
The marker was hard to read and is difficult to tell if it was treated PAT versus flutter.  I will await the Holter monitor report and review with electrophysiology team.  If it is indeed atrial flutter, we would need to discuss risks associated including stroke.

## 2017-03-01 NOTE — Assessment & Plan Note (Addendum)
Several episodes of 5-20 beat runs of nonsustained V. tach.  This is quite concerning.  We will ask the elective physiology colleagues to review the strips with me to determine if the rhythm is truly sustained V. tach versus PAT or SVT with aberrancy.  Electrolytes are relatively normal. However regardless with the extent of ectopy, we will check a 2D echocardiogram for structural evaluation.  Depending on what this shows, if the EF is preserved, would proceed with a Myoview stress test.  However if the EF is reduced, we would then likely consider a more definitive ischemic evaluation with either coronary CT angiogram, or if bad enough, cardiac catheterization.  Continue beta-blocker for now.  Continue taking beta-blocker for now.

## 2017-03-01 NOTE — Assessment & Plan Note (Signed)
He has a diagnosis of hyperlipidemia listed, but is not currently on any medications.  With concern for ischemic evaluation, if there is evidence of significant coronary disease would need to be more aggressive with lipid control.  We will need to get baseline lab value upon his return.

## 2017-03-01 NOTE — Assessment & Plan Note (Signed)
Has had no episodes since 2011.  However, it would be possible that 1 of these arrhythmias could be the etiology.

## 2017-03-02 ENCOUNTER — Other Ambulatory Visit: Payer: Self-pay | Admitting: *Deleted

## 2017-03-02 MED ORDER — PROPRANOLOL HCL 10 MG PO TABS: 10.0000 mg | ORAL_TABLET | Freq: Three times a day (TID) | ORAL | 0 refills | Status: DC | PRN

## 2017-03-08 ENCOUNTER — Other Ambulatory Visit (HOSPITAL_COMMUNITY): Payer: 59

## 2017-03-10 ENCOUNTER — Telehealth: Payer: Self-pay | Admitting: *Deleted

## 2017-03-10 NOTE — Telephone Encounter (Signed)
Received notification from [facility].  Patient had episode of SVT, 16 sec rate of 162, sinus rhythm with bigeminal PACS/atrial run at 10:17 AM, auto trigger.     Patient was called. States he was out shoveling snow at this time.  When he went inside he realized his heart rate was elevated, denies any symptoms (dizziness, lightheadedness, CP, SOB) other than palpitations or awareness of HR elevation.     OV 12/4 with Dr. Ellyn Hack due to multiple events noted on monitor.     Routed to Dr. Ellyn Hack for review/recommendations.  Strips placed in Dr. Ellyn Hack box to review when back in office.   Advised to call with any questions or concerns.

## 2017-03-11 NOTE — Telephone Encounter (Signed)
Looks like he is still having SVT.  Not prolonged episodes thankfully.  We can discuss when I see him back.  Glenetta Hew, MD

## 2017-03-16 ENCOUNTER — Ambulatory Visit (HOSPITAL_COMMUNITY): Payer: 59 | Attending: Cardiovascular Disease

## 2017-03-16 ENCOUNTER — Other Ambulatory Visit: Payer: Self-pay

## 2017-03-16 DIAGNOSIS — I472 Ventricular tachycardia: Secondary | ICD-10-CM | POA: Diagnosis not present

## 2017-03-16 DIAGNOSIS — E785 Hyperlipidemia, unspecified: Secondary | ICD-10-CM | POA: Insufficient documentation

## 2017-03-16 DIAGNOSIS — I4729 Other ventricular tachycardia: Secondary | ICD-10-CM

## 2017-03-16 DIAGNOSIS — I471 Supraventricular tachycardia: Secondary | ICD-10-CM

## 2017-03-16 DIAGNOSIS — R55 Syncope and collapse: Secondary | ICD-10-CM | POA: Diagnosis not present

## 2017-03-16 DIAGNOSIS — I4892 Unspecified atrial flutter: Secondary | ICD-10-CM | POA: Insufficient documentation

## 2017-03-16 DIAGNOSIS — R9431 Abnormal electrocardiogram [ECG] [EKG]: Secondary | ICD-10-CM | POA: Diagnosis present

## 2017-03-16 LAB — ECHOCARDIOGRAM COMPLETE
CHL CUP DOP CALC LVOT VTI: 18 cm
EERAT: 6.67
EWDT: 225 ms
FS: 49 % — AB (ref 28–44)
IV/PV OW: 1
LA diam end sys: 36 mm
LA diam index: 1.57 cm/m2
LASIZE: 36 mm
LAVOLA4C: 30 mL
LV E/e'average: 6.67
LVEEMED: 6.67
LVELAT: 12.2 cm/s
LVOT area: 4.15 cm2
LVOT diameter: 23 mm
LVOT peak grad rest: 2 mmHg
LVOTPV: 78.2 cm/s
LVOTSV: 75 mL
MV Dec: 225
MV Peak grad: 3 mmHg
MV pk A vel: 69.6 m/s
MVPKEVEL: 81.4 m/s
PW: 8.17 mm — AB (ref 0.6–1.1)
RV LATERAL S' VELOCITY: 13.5 cm/s
S' Lateral: 13.5 cm/s
TDI e' lateral: 12.2
TDI e' medial: 8.01

## 2017-03-18 NOTE — Progress Notes (Signed)
Echo results: Good news: Essentially normal echocardiogram and normal pump function and normal valve function.  No regional wall motion abnormalities -- No signs to suggest heart attack.. EF: 60-65%.   Glenetta Hew, MD  Pls fwd to PCP: Lance Sell, NP

## 2017-03-27 DIAGNOSIS — K449 Diaphragmatic hernia without obstruction or gangrene: Secondary | ICD-10-CM | POA: Insufficient documentation

## 2017-03-27 DIAGNOSIS — T7840XA Allergy, unspecified, initial encounter: Secondary | ICD-10-CM | POA: Insufficient documentation

## 2017-03-27 DIAGNOSIS — E785 Hyperlipidemia, unspecified: Secondary | ICD-10-CM | POA: Insufficient documentation

## 2017-03-27 DIAGNOSIS — K219 Gastro-esophageal reflux disease without esophagitis: Secondary | ICD-10-CM | POA: Insufficient documentation

## 2017-03-31 ENCOUNTER — Ambulatory Visit (AMBULATORY_SURGERY_CENTER): Payer: Self-pay

## 2017-03-31 ENCOUNTER — Other Ambulatory Visit: Payer: Self-pay

## 2017-03-31 VITALS — Ht 75.0 in | Wt 222.2 lb

## 2017-03-31 DIAGNOSIS — K51919 Ulcerative colitis, unspecified with unspecified complications: Secondary | ICD-10-CM

## 2017-03-31 MED ORDER — NA SULFATE-K SULFATE-MG SULF 17.5-3.13-1.6 GM/177ML PO SOLN: 1.0000 | Freq: Once | ORAL | 0 refills | Status: AC

## 2017-03-31 NOTE — Progress Notes (Signed)
Denies allergies to eggs or soy products. Denies complication of anesthesia or sedation. Denies use of weight loss medication. Denies use of O2.   Emmi instructions declined.  

## 2017-04-07 ENCOUNTER — Encounter: Payer: Self-pay | Admitting: Gastroenterology

## 2017-04-10 ENCOUNTER — Other Ambulatory Visit: Payer: Self-pay | Admitting: Nurse Practitioner

## 2017-04-10 MED ORDER — PROPRANOLOL HCL 10 MG PO TABS: 10.0000 mg | ORAL_TABLET | Freq: Three times a day (TID) | ORAL | 0 refills | Status: DC | PRN

## 2017-04-14 ENCOUNTER — Encounter: Payer: Self-pay | Admitting: Gastroenterology

## 2017-04-14 ENCOUNTER — Ambulatory Visit (AMBULATORY_SURGERY_CENTER): Payer: 59 | Admitting: Gastroenterology

## 2017-04-14 ENCOUNTER — Other Ambulatory Visit: Payer: Self-pay

## 2017-04-14 VITALS — BP 104/66 | HR 44 | Temp 97.8°F | Resp 18 | Ht 75.0 in | Wt 222.0 lb

## 2017-04-14 DIAGNOSIS — K529 Noninfective gastroenteritis and colitis, unspecified: Secondary | ICD-10-CM | POA: Diagnosis not present

## 2017-04-14 DIAGNOSIS — K5190 Ulcerative colitis, unspecified, without complications: Secondary | ICD-10-CM | POA: Diagnosis not present

## 2017-04-14 DIAGNOSIS — K6389 Other specified diseases of intestine: Secondary | ICD-10-CM | POA: Diagnosis not present

## 2017-04-14 DIAGNOSIS — K51919 Ulcerative colitis, unspecified with unspecified complications: Secondary | ICD-10-CM

## 2017-04-14 DIAGNOSIS — D126 Benign neoplasm of colon, unspecified: Secondary | ICD-10-CM | POA: Diagnosis not present

## 2017-04-14 MED ORDER — SODIUM CHLORIDE 0.9 % IV SOLN: 500.0000 mL | Freq: Once | INTRAVENOUS | Status: DC

## 2017-04-14 NOTE — Progress Notes (Signed)
Pt's states no medical or surgical changes since previsit or office visit. 

## 2017-04-14 NOTE — Progress Notes (Signed)
Report to PACU, RN, vss, BBS= Clear.  

## 2017-04-14 NOTE — Op Note (Signed)
[facility] Patient Name: Joshua Li Procedure Date: 04/14/2017 11:08 AM MRN: [id number] Endoscopist: Ladene Artist , MD Age: 57 Referring MD:  Date of Birth: Jun 24, 1960 Gender: Male Account #: [id number] Procedure:                Ileooscopy Indications:              High risk colon cancer surveillance: Ulcerative                            colitis. Medicines:                Monitored Anesthesia Care Procedure:                Pre-Anesthesia Assessment:                           - Prior to the procedure, a History and Physical                            was performed, and patient medications and                            allergies were reviewed. The patient's tolerance of                            previous anesthesia was also reviewed. The risks                            and benefits of the procedure and the sedation                            options and risks were discussed with the patient.                            All questions were answered, and informed consent                            was obtained. Prior Anticoagulants: The patient has                            taken no previous anticoagulant or antiplatelet                            agents. ASA Grade Assessment: II - A patient with                            mild systemic disease. After reviewing the risks                            and benefits, the patient was deemed in                            satisfactory condition to undergo the procedure.  After obtaining informed consent, the colonoscope                            was passed under direct vision. Throughout the                            procedure, the patient's blood pressure, pulse, and                            oxygen saturations were monitored continuously. The                            Colonoscope was introduced through the anus and                            advanced to the 20 cm into the ileum. The rectum                     was photographed. The colonoscopy was performed                            without difficulty. The patient tolerated the                            procedure well. The quality of the bowel                            preparation was good. Scope In: 11:17:12 AM Scope Out: 11:23:14 AM Total Procedure Duration: 0 hours 6 minutes 2 seconds  Findings:                 The perianal and digital rectal examinations were                            normal.                           The ileoanal pouch contained a few scattered                            non-bleeding erosions. No stigmata of recent                            bleeding were seen. Biopsies were taken with a cold                            forceps for histology.                           The ileoanal pouch contained one localized sessile,                            non-bleeding polyp. The polyp was 7 mm in diameter.                            The  polyp was removed with a cold snare. Resection                            and retrieval were complete. Complications:            No immediate complications. Estimated Blood Loss:     Estimated blood loss was minimal. Impression:               - A few erosions in the ileoanal pouch. Biopsied.                           - One ileal polyp in the ileoanal pouch, removed                            with a cold snare. Resected and retrieved. Recommendation:           - Patient has a contact number available for                            emergencies. The signs and symptoms of potential                            delayed complications were discussed with the                            patient. Return to normal activities tomorrow.                            Written discharge instructions were provided to the                            patient.                           - Resume previous diet.                           - Continue present medications.                           - Await  pathology results.                           - Repeat ileoscopy for surveillance based on                            pathology results.                           - GI office appt in 1 year and as needed. Ladene Artist, MD 04/14/2017 11:39:06 AM This report has been signed electronically.

## 2017-04-14 NOTE — Patient Instructions (Addendum)
YOU HAD AN ENDOSCOPIC PROCEDURE TODAY AT [facility]:   Refer to the procedure report that was given to you for any specific questions about what was found during the examination.  If the procedure report does not answer your questions, please call your gastroenterologist to clarify.  If you requested that your care partner not be given the details of your procedure findings, then the procedure report has been included in a sealed envelope for you to review at your convenience later.  YOU SHOULD EXPECT: Some feelings of bloating in the abdomen. Passage of more gas than usual.  Walking can help get rid of the air that was put into your GI tract during the procedure and reduce the bloating. If you had a lower endoscopy (such as a colonoscopy or flexible sigmoidoscopy) you may notice spotting of blood in your stool or on the toilet paper. If you underwent a bowel prep for your procedure, you may not have a normal bowel movement for a few days.  Please Note:  You might notice some irritation and congestion in your nose or some drainage.  This is from the oxygen used during your procedure.  There is no need for concern and it should clear up in a day or so.  SYMPTOMS TO REPORT IMMEDIATELY:   Following lower endoscopy (colonoscopy or flexible sigmoidoscopy):  Excessive amounts of blood in the stool  Significant tenderness or worsening of abdominal pains  Swelling of the abdomen that is new, acute  Fever of 100F or higher  For urgent or emergent issues, a gastroenterologist can be reached at any hour by calling [phone].   DIET:  We do recommend a small meal at first, but then you may proceed to your regular diet.  Drink plenty of fluids but you should avoid alcoholic beverages for 24 hours.  ACTIVITY:  You should plan to take it easy for the rest of today and you should NOT DRIVE or use heavy machinery until tomorrow (because of the sedation medicines used during the test).     FOLLOW UP: Our staff will call the number listed on your records the next business day following your procedure to check on you and address any questions or concerns that you may have regarding the information given to you following your procedure. If we do not reach you, we will leave a message.  However, if you are feeling well and you are not experiencing any problems, there is no need to return our call.  We will assume that you have returned to your regular daily activities without incident.  If any biopsies were taken you will be contacted by phone or by letter within the next 1-3 weeks.  Please call us at [phone] if you have not heard about the biopsies in 3 weeks.   Await for biopsy results Repeat Ileoscopy for surveillance based on biopsy results GI office appt in 1 year as needed Polyp (handout given)   SIGNATURES/CONFIDENTIALITY: You and/or your care partner have signed paperwork which will be entered into your electronic medical record.  These signatures attest to the fact that that the information above on your After Visit Summary has been reviewed and is understood.  Full responsibility of the confidentiality of this discharge information lies with you and/or your care-partner.

## 2017-04-17 ENCOUNTER — Telehealth: Payer: Self-pay

## 2017-04-17 NOTE — Telephone Encounter (Signed)
  Follow up Call-  Call back number 04/14/2017  Post procedure Call Back phone  # [phone]  Permission to leave phone message Yes  Some recent data might be hidden     No answer still in the afternoon Missed talking to pt twice today Angela/Recovery Room

## 2017-04-24 ENCOUNTER — Ambulatory Visit: Payer: 59 | Admitting: Cardiology

## 2017-05-01 ENCOUNTER — Encounter: Payer: Self-pay | Admitting: Gastroenterology

## 2017-05-04 ENCOUNTER — Encounter: Payer: Self-pay | Admitting: Cardiology

## 2017-05-04 ENCOUNTER — Ambulatory Visit: Payer: 59 | Admitting: Cardiology

## 2017-05-04 VITALS — BP 100/62 | HR 51 | Ht 75.0 in | Wt 223.2 lb

## 2017-05-04 DIAGNOSIS — R55 Syncope and collapse: Secondary | ICD-10-CM | POA: Diagnosis not present

## 2017-05-04 DIAGNOSIS — I480 Paroxysmal atrial fibrillation: Secondary | ICD-10-CM | POA: Diagnosis not present

## 2017-05-04 DIAGNOSIS — I4729 Other ventricular tachycardia: Secondary | ICD-10-CM

## 2017-05-04 DIAGNOSIS — I472 Ventricular tachycardia: Secondary | ICD-10-CM

## 2017-05-04 DIAGNOSIS — I471 Supraventricular tachycardia: Secondary | ICD-10-CM | POA: Diagnosis not present

## 2017-05-04 NOTE — Assessment & Plan Note (Signed)
This patients CHA2DS2-VASc Score and unadjusted Ischemic Stroke Rate (% per year) is equal to 0.2 % stroke rate/year from a score of 0  Above score calculated as 1 point each if present [CHF, HTN, DM, Vascular=MI/PAD/Aortic Plaque, Age if 65-74, or Male] Above score calculated as 2 points each if present [Age > 75, or Stroke/TIA/TE]  For now will use aspirin and beta-blocker.  However based on the variability of his arrhythmias, I have asked for electrophysiology assistance.  The question is do we do rhythm control rate control or other more advanced therapies.

## 2017-05-04 NOTE — Progress Notes (Signed)
PCP: Lance Sell, NP  Clinic Note: Chief Complaint  Patient presents with  . Follow-up    Monitor results; A. fib, atrial flutter, possible PAT and VT  . Atrial Fibrillation    With atrial flutter, some aberrancy  . Near Syncope    HPI: Joshua Li is a 57 y.o. male who is being seen today for follow-up evaluation of rapid heart rate/palpitations at the request of Lance Sell, NP.  Joshua Li was seen on October 29 by Lance Sell, NP in order to establish care and transitioning providers.  As part of his evaluation he noted that he has been having intermittent episodes of rapid heartbeat palpitations spells off and on for about the last 8 years.  He was therefore referred for cardiology evaluation.  I saw him initially on November 14.  We ordered a 30 event monitor.  This has shown several different episodes of PAT, frequent PVCs, possible atrial flutter as well as a pause and several short runs of NSVT.  Recent Hospitalizations: None  Studies Personally Reviewed - (if available, images/films reviewed: From Epic Chart or Care Everywhere)  Cardiac event monitor:  Monitor worn from November 29 - March 24, 2017.-  Minimum heart rate 34-35 bpm with sinus bradycardia. Also 3.1-second pause.  Maximum heart rate short run of VT at 200 beats a minute. Also apparent SVT/atrial tachycardia at 164 bpm.  Very confusing monitor with several episodes of what appears to be possible atrial tachycardia versus SVT intermittently with atrial fibrillation.  Multiple runs of ventricular tachycardia ranging from 3-14 bpm.  2 actual manually detected events with symptoms of rapid heartbeat: Run of V. tach 13 beats, atrial tachycardia/PSVT run.  Very complicated event monitor with significant amounts of arrhythmias ranging from sinus bradycardia with 3-second pause to apparent ventricular tachycardia as well as SVT/atrial run and likely atrial fibrillation. Will need  to ask for electrophysiology review.  2 D EchoEF: 60-65%. Normal wall motion. Normal valve function.  Interval History: Joshua Li presents today to discuss the findings on his event monitor.  After completing his entire monitor, there was significant episodes of what appears more to be consistent with atrial fibrillation as well as several short runs of V. tach.  He definitely has had episodes of near syncope, but no true syncope.  He says he has had episodes that have lasted half an hour to almost all night long or all day.  He says that since backing off on his caffeine, and starting the beta-blocker, he has noted these episodes improving.  Both happening less frequently and with less duration, but still present.  He has not had any chest tightness or pressure at all associated with these episodes.  He may get a little lightheaded and dizzy and has had actually near syncope at least twice.  But he has not had true syncope.  No dyspnea except when he is really going fast. He denies any PND, orthopnea or edema.  Cardiovascular Review Of Symptoms : no chest pain or dyspnea on exertion positive for - irregular heartbeat, palpitations, rapid heart rate and Dizziness but no true syncope negative for - edema, loss of consciousness, murmur, orthopnea, paroxysmal nocturnal dyspnea, shortness of breath or TIA/amaurosis fugax.  No claudication   ROS: A comprehensive was performed. Review of Systems  Constitutional: Negative for malaise/fatigue (He only feels tired when he has the palpitations) and weight loss.  HENT: Negative for nosebleeds.   Respiratory: Negative for cough and shortness  of breath.   Cardiovascular:       Per HPI  Gastrointestinal: Negative for blood in stool and melena.  Genitourinary: Negative for dysuria and hematuria.  Musculoskeletal: Positive for myalgias (Just has some cramping.). Negative for falls.  Neurological: Negative for dizziness.  Endo/Heme/Allergies: Does not bruise/bleed  easily.  Psychiatric/Behavioral: The patient is not nervous/anxious and does not have insomnia.   All other systems reviewed and are negative.  I have reviewed and (if needed) personally updated the patient's problem list, medications, allergies, past medical and surgical history, social and family history.   Past Medical History:  Diagnosis Date  . Allergy   . Asthma   . GERD (gastroesophageal reflux disease)   . Heart murmur   . Hiatal hernia   . Hyperlipidemia   . Ulcerative colitis 2001   Status post total colectomy with J-pouch in 2003 -     Past Surgical History:  Procedure Laterality Date  . CHOLECYSTECTOMY    . COLECTOMY  2003   with one step take-down to J-pouch  . COLECTOMY    . COLECTOMY  2003   total colectomy with J pouch (one step procedure)  . COLECTOMY  2003   total colectomy with j pouch ( one step)  . TONSILLECTOMY    . TOTAL COLECTOMY  2003   total colectomy with J pouch     Current Meds  Medication Sig  . aspirin 81 MG tablet Take 81 mg by mouth daily.    Marland Kitchen loratadine (CLARITIN) 10 MG tablet Take 10 mg daily as needed by mouth for allergies.  Marland Kitchen omeprazole (PRILOSEC) 40 MG capsule Take 1 capsule (40 mg total) daily by mouth.  . propranolol (INDERAL) 10 MG tablet Take 1 tablet (10 mg total) by mouth 3 (three) times daily as needed.   Current Facility-Administered Medications for the 05/04/17 encounter (Office Visit) with Leonie Man, MD  Medication  . 0.9 %  sodium chloride infusion    No Known Allergies  Social History   Socioeconomic History  . Marital status: Divorced    Spouse name: None  . Number of children: 1  . Years of education: 57  . Highest education level: Bachelor's degree (e.g., BA, AB, BS)  Social Needs  . Financial resource strain: None  . Food insecurity - worry: None  . Food insecurity - inability: None  . Transportation needs - medical: None  . Transportation needs - non-medical: None  Occupational History  .  Occupation: Retail    Comment: [employer]  Tobacco Use  . Smoking status: Never Smoker  . Smokeless tobacco: Never Used  Substance and Sexual Activity  . Alcohol use: Yes    Alcohol/week: 1.2 oz    Types: 2 Cans of beer per week  . Drug use: No  . Sexual activity: Yes  Other Topics Concern  . None  Social History Narrative   Fun: Geocaching   Denies religious beliefs effecting health care.     family history includes Cancer in his mother; Heart attack (age of onset: 30) in his father; Lymphoma (age of onset: 70) in his brother; Stomach cancer (age of onset: 25) in his father.  Wt Readings from Last 3 Encounters:  05/04/17 223 lb 3.2 oz (101.2 kg)  04/14/17 222 lb (100.7 kg)  03/31/17 222 lb 3.2 oz (100.8 kg)    PHYSICAL EXAM BP 100/62   Pulse (!) 51   Ht 6' 3"  (1.905 m)   Wt 223 lb 3.2 oz (101.2  kg)   SpO2 95%   BMI 27.90 kg/m  Physical Exam  Constitutional: He is oriented to person, place, and time. He appears well-developed and well-nourished. No distress.  Relatively healthy appearing  HENT:  Head: Normocephalic and atraumatic.  Mouth/Throat: No oropharyngeal exudate.  Eyes: EOM are normal.  Wears glasses  Neck: Normal range of motion. Neck supple. No hepatojugular reflux and no JVD present. Carotid bruit is not present.  Cardiovascular: Regular rhythm, normal heart sounds and intact distal pulses.  No extrasystoles are present. Bradycardia present. PMI is not displaced. Exam reveals no gallop and no friction rub.  No murmur heard. Pulmonary/Chest: Effort normal and breath sounds normal. No respiratory distress. He has no wheezes.  Musculoskeletal: Normal range of motion. He exhibits no edema or deformity.  Neurological: He is alert and oriented to person, place, and time. No cranial nerve deficit.  Psychiatric: He has a normal mood and affect. His behavior is normal. Judgment and thought content normal.  Nursing note and vitals reviewed.    Adult ECG Report Not  checked  Other studies Reviewed: Additional studies/ records that were reviewed today include:  Recent Labs:   Lab Results  Component Value Date   CREATININE 1.02 01/27/2017   BUN 20 01/27/2017   NA 141 01/27/2017   K 4.1 01/27/2017   CL 105 01/27/2017   CO2 29 01/27/2017   Lab Results  Component Value Date   CHOL 169 01/27/2017   HDL 52.00 01/27/2017   LDLCALC 94 01/27/2017   LDLDIRECT 123.3 03/02/2009   TRIG 115.0 01/27/2017   CHOLHDL 3 01/27/2017   Lab Results  Component Value Date   WBC 7.0 01/27/2017   HGB 15.6 01/27/2017   HCT 44.7 01/27/2017   MCV 88.8 01/27/2017   PLT 236.0 01/27/2017   Lab Results  Component Value Date   TSH 1.45 01/27/2017    ASSESSMENT / PLAN: Problem List Items Addressed This Visit    Near syncope (Chronic)    Thankfully, no true syncope episodes, but is definitely having some episodes of near syncope that could easily be the attributed to arrhythmia seen on his monitor.  Based on the variability of findings on the monitor between what looks like atrial fibrillation/A flutter and mediated PAT with likely VT plus/minus A. fib with aberrancy.  Abdomen keep him on his beta-blocker, but will refer to electrophysiology for guidance and assistance.      Relevant Orders   MYOCARDIAL PERFUSION IMAGING   Ambulatory referral to Cardiology   Non-sustained ventricular tachycardia (HCC) - 5-20 beat runs - Primary (Chronic)    Definitely had several episodes.  We did check an echocardiogram that showed preserved EF. Plan: Check Myoview stress test to exclude ischemic etiology. Referred to electrophysiology for assistance Continue beta-blocker.      Relevant Orders   MYOCARDIAL PERFUSION IMAGING   Ambulatory referral to Cardiology   Paroxysmal atrial fibrillation (HCC) (Chronic)    This patients CHA2DS2-VASc Score and unadjusted Ischemic Stroke Rate (% per year) is equal to 0.2 % stroke rate/year from a score of 0  Above score calculated as 1  point each if present [CHF, HTN, DM, Vascular=MI/PAD/Aortic Plaque, Age if 65-74, or Male] Above score calculated as 2 points each if present [Age > 75, or Stroke/TIA/TE]  For now will use aspirin and beta-blocker.  However based on the variability of his arrhythmias, I have asked for electrophysiology assistance.  The question is do we do rhythm control rate control or other more advanced  therapies.      Relevant Orders   MYOCARDIAL PERFUSION IMAGING   Ambulatory referral to Cardiology   PAT (paroxysmal atrial tachycardia) (HCC) - vs. >? Atrial Flutter (Chronic)   Relevant Orders   MYOCARDIAL PERFUSION IMAGING   Ambulatory referral to Cardiology      Current medicines are reviewed at length with the patient today. (+/- concerns) n/a The following changes have been made: n/a  Patient Instructions  MEDICATION  NO CHANGES ,CONTINUE WITH CURRENT MEDICATIONS   You have been referred to  DR Onycha AT [facility]  ( ELECTROPHYSIOLOGIST) TO DISCUSS EVENT MONITOR.   SCHEDULE AT [facility] Your physician has requested that you have en exercise stress myoview. For further information please visit [email or web address]. Please follow instruction sheet, as given.   Your physician recommends that you schedule a follow-up appointment in [place].   Studies Ordered:   Orders Placed This Encounter  Procedures  . Ambulatory referral to Cardiology  . MYOCARDIAL PERFUSION IMAGING      Glenetta Hew, M.D., M.S. Interventional Cardiologist   Pager # [phone] Phone # [phone] [address] [place], [place] [address]

## 2017-05-04 NOTE — Assessment & Plan Note (Signed)
Thankfully, no true syncope episodes, but is definitely having some episodes of near syncope that could easily be the attributed to arrhythmia seen on his monitor.  Based on the variability of findings on the monitor between what looks like atrial fibrillation/A flutter and mediated PAT with likely VT plus/minus A. fib with aberrancy.  Abdomen keep him on his beta-blocker, but will refer to electrophysiology for guidance and assistance.

## 2017-05-04 NOTE — Patient Instructions (Signed)
MEDICATION  NO CHANGES ,CONTINUE WITH CURRENT MEDICATIONS    You have been referred to  DR [facility] AT [facility]  ( ELECTROPHYSIOLOGIST) TO DISCUSS EVENT MONITOR.    SCHEDULE AT [place] Your physician has requested that you have en exercise stress myoview. For further information please visit [email or web address]. Please follow instruction sheet, as given.    Your physician recommends that you schedule a follow-up appointment in [place].     Cardiac Nuclear Scan A cardiac nuclear scan is a test that measures blood flow to the heart when a person is resting and when he or she is exercising. The test looks for problems such as:  Not enough blood reaching a portion of the heart.  The heart muscle not working normally.  You may need this test if:  You have heart disease.  You have had abnormal lab results.  You have had heart surgery or angioplasty.  You have chest pain.  You have shortness of breath.  In this test, a radioactive dye (tracer) is injected into your bloodstream. After the tracer has traveled to your heart, an imaging device is used to measure how much of the tracer is absorbed by or distributed to various areas of your heart. This procedure is usually done at a hospital and takes 2-4 hours. Tell a health care provider about:  Any allergies you have.  All medicines you are taking, including vitamins, herbs, eye drops, creams, and over-the-counter medicines.  Any problems you or family members have had with the use of anesthetic medicines.  Any blood disorders you have.  Any surgeries you have had.  Any medical conditions you have.  Whether you are pregnant or may be pregnant. What are the risks? Generally, this is a safe procedure. However, problems may occur, including:  Serious chest pain and heart attack. This is only a risk if the stress portion of the test is  done.  Rapid heartbeat.  Sensation of warmth in your chest. This usually passes quickly.  What happens before the procedure?  Ask your health care provider about changing or stopping your regular medicines. This is especially important if you are taking diabetes medicines or blood thinners.  Remove your jewelry on the day of the procedure. What happens during the procedure?  An IV tube will be inserted into one of your veins.  Your health care provider will inject a small amount of radioactive tracer through the tube.  You will wait for 20-40 minutes while the tracer travels through your bloodstream.  Your heart activity will be monitored with an electrocardiogram (ECG).  You will lie down on an exam table.  Images of your heart will be taken for about 15-20 minutes.  You may be asked to exercise on a treadmill or stationary bike. While you exercise, your heart's activity will be monitored with an ECG, and your blood pressure will be checked. If you are unable to exercise, you may be given a medicine to increase blood flow to parts of your heart.  When blood flow to your heart has peaked, a tracer will again be injected through the IV tube.  After 20-40 minutes, you will get back on the exam table and have more images taken of your heart.  When the procedure is over, your IV tube will be removed. The procedure may vary among health care providers and hospitals. Depending on the type of tracer used, scans  may need to be repeated 3-4 hours later. What happens after the procedure?  Unless your health care provider tells you otherwise, you may return to your normal schedule, including diet, activities, and medicines.  Unless your health care provider tells you otherwise, you may increase your fluid intake. This will help flush the contrast dye from your body. Drink enough fluid to keep your urine clear or pale yellow.  It is up to you to get your test results. Ask your health care  provider, or the department that is doing the test, when your results will be ready. Summary  A cardiac nuclear scan measures the blood flow to the heart when a person is resting and when he or she is exercising.  You may need this test if you are at risk for heart disease.  Tell your health care provider if you are pregnant.  Unless your health care provider tells you otherwise, increase your fluid intake. This will help flush the contrast dye from your body. Drink enough fluid to keep your urine clear or pale yellow. This information is not intended to replace advice given to you by your health care provider. Make sure you discuss any questions you have with your health care provider. Document Released: 04/08/2004 Document Revised: 03/16/2016 Document Reviewed: 02/20/2013 Elsevier Interactive Patient Education  2017 [employer].

## 2017-05-04 NOTE — Assessment & Plan Note (Signed)
Definitely had several episodes.  We did check an echocardiogram that showed preserved EF. Plan: Check Myoview stress test to exclude ischemic etiology. Referred to electrophysiology for assistance Continue beta-blocker.

## 2017-05-10 ENCOUNTER — Telehealth (HOSPITAL_COMMUNITY): Payer: Self-pay | Admitting: Cardiology

## 2017-05-10 NOTE — Telephone Encounter (Signed)
User: Cherie Dark A Date/time: 05/10/17 9:08 AM  Comment: Called pt and lmsg to CB about changing appt date for myoview..  Context:  Outcome: Left Message  Phone number: [phone] Phone Type: Home Phone  Comm. type: Telephone Call type: Outgoing  Contact: Luretha Rued A Relation to patient: Self   Patient called back and I was able to reschedule him to 2/27 at 8:00am. He voiced understanding.

## 2017-05-17 ENCOUNTER — Ambulatory Visit (HOSPITAL_COMMUNITY): Admission: RE | Admit: 2017-05-17 | Payer: 59 | Source: Ambulatory Visit | Attending: Cardiology | Admitting: Cardiology

## 2017-05-19 ENCOUNTER — Telehealth (HOSPITAL_COMMUNITY): Payer: Self-pay

## 2017-05-19 NOTE — Telephone Encounter (Signed)
Encounter complete. 

## 2017-05-24 ENCOUNTER — Other Ambulatory Visit: Payer: Self-pay

## 2017-05-24 ENCOUNTER — Ambulatory Visit (HOSPITAL_COMMUNITY)
Admission: RE | Admit: 2017-05-24 | Discharge: 2017-05-24 | Disposition: A | Discharge status: Home / Self Care | Payer: 59 | Source: Ambulatory Visit | Attending: Cardiology | Admitting: Cardiology

## 2017-05-24 ENCOUNTER — Encounter: Payer: Self-pay | Admitting: Cardiology

## 2017-05-24 DIAGNOSIS — I480 Paroxysmal atrial fibrillation: Secondary | ICD-10-CM

## 2017-05-24 DIAGNOSIS — I4729 Other ventricular tachycardia: Secondary | ICD-10-CM

## 2017-05-24 DIAGNOSIS — I472 Ventricular tachycardia: Secondary | ICD-10-CM | POA: Diagnosis not present

## 2017-05-24 DIAGNOSIS — R55 Syncope and collapse: Secondary | ICD-10-CM

## 2017-05-24 DIAGNOSIS — I471 Supraventricular tachycardia: Secondary | ICD-10-CM | POA: Diagnosis not present

## 2017-05-24 LAB — MYOCARDIAL PERFUSION IMAGING
CHL CUP RESTING HR STRESS: 137 {beats}/min
NUC STRESS TID: 1.2
Peak HR: 144 {beats}/min
SDS: 1
SRS: 2
SSS: 3

## 2017-05-24 MED ORDER — REGADENOSON 0.4 MG/5ML IV SOLN
0.4000 mg | Freq: Once | INTRAVENOUS | Status: AC
  Administered 2017-05-24: 0.4 mg via INTRAVENOUS

## 2017-05-24 MED ORDER — TECHNETIUM TC 99M TETROFOSMIN IV KIT
10.3000 | PACK | Freq: Once | INTRAVENOUS | Status: AC | PRN
  Administered 2017-05-24: 10.3 via INTRAVENOUS
  Filled 2017-05-24: qty 11

## 2017-05-24 MED ORDER — AMINOPHYLLINE 25 MG/ML IV SOLN
75.0000 mg | Freq: Once | INTRAVENOUS | Status: AC
  Administered 2017-05-24: 75 mg via INTRAVENOUS

## 2017-05-24 MED ORDER — OMEPRAZOLE 40 MG PO CPDR: 40.0000 mg | DELAYED_RELEASE_CAPSULE | Freq: Every day | ORAL | 0 refills | Status: DC

## 2017-05-24 MED ORDER — TECHNETIUM TC 99M TETROFOSMIN IV KIT
31.8000 | PACK | Freq: Once | INTRAVENOUS | Status: AC | PRN
  Administered 2017-05-24: 31.8 via INTRAVENOUS
  Filled 2017-05-24: qty 32

## 2017-05-26 ENCOUNTER — Telehealth: Payer: Self-pay | Admitting: *Deleted

## 2017-05-26 MED ORDER — FLECAINIDE ACETATE 50 MG PO TABS: 50.0000 mg | ORAL_TABLET | Freq: Two times a day (BID) | ORAL | 6 refills | Status: DC

## 2017-05-26 MED ORDER — PROPRANOLOL HCL 10 MG PO TABS: 10.0000 mg | ORAL_TABLET | Freq: Two times a day (BID) | ORAL | 3 refills | Status: DC

## 2017-05-26 NOTE — Telephone Encounter (Signed)
-----   Message from Leonie Man, MD sent at 05/24/2017  6:20 PM EST ----- Doristine Devoid news -- Stress Test is LOW RISK.  No sign of heart attack or "ischemia" / I.e. Blockages.  Based upon discussion with Electrophysiology, would start on Flecainide (2 days 148m BID) then 50 mg BID after. Needs to be taking Propranolol as a standing Med BID.  DGlenetta Hew

## 2017-05-26 NOTE — Telephone Encounter (Signed)
SPOKE WITH PATIENT , PATIENT STATES HE HAS STARTED TAKING PROPRANOLOL TWICE A DAY.Patient states he will wait and see Dr Curt Bears on 05/31/17 before starting flecainide. Prescriptions sent to requested local pharmacy.

## 2017-05-30 NOTE — Progress Notes (Signed)
Electrophysiology Office Note   Date:  05/31/2017   ID:  [name], DOB Feb 27, 1961, MRN [id number]  PCP:  Lance Sell, NP  Cardiologist:  Ellyn Hack Primary Electrophysiologist:  Will Meredith Leeds, MD    Chief Complaint  Patient presents with  . Advice Only    Afib/Ventricular tachycardia/PAT     History of Present Illness: Joshua Li is a 56 y.o. male who is being seen today for the evaluation of palpitations at the request of Glenetta Hew. Presenting today for electrophysiology evaluation.  He has a history of  hyperlipidemia.  He was seen October 2018 and was noted to have intermittent palpitations on and off for 8 years.  He wore a 30-day monitor that showed multiple arrhythmias including PAT, PVCs, possible atrial flutter as well as nonsustained VT and short runs.  He had an echo that showed a normal EF and a low risk Myoview.  He has had near syncope in the past but no syncope.  He was since put on flecainide and propranolol.  He has yet to take the flecainide.  His palpitations occur at all times of the day.  There are days that he has quite a bit of issue and other days where he feels well.  There are no exacerbating or alleviating factors.  Today, he denies symptoms of chest pain, shortness of breath, orthopnea, PND, lower extremity edema, claudication, dizziness, presyncope, syncope, bleeding, or neurologic sequela. The patient is tolerating medications without difficulties.    Past Medical History:  Diagnosis Date  . Allergy   . Asthma   . GERD (gastroesophageal reflux disease)   . Heart murmur   . Hiatal hernia   . Hyperlipidemia   . Ulcerative colitis 2001   Status post total colectomy with J-pouch in 2003 -    Past Surgical History:  Procedure Laterality Date  . CHOLECYSTECTOMY    . COLECTOMY  2003   with one step take-down to J-pouch  . COLECTOMY    . COLECTOMY  2003   total colectomy with J pouch (one step procedure)  . COLECTOMY  2003   total colectomy with j pouch ( one step)  . TONSILLECTOMY    . TOTAL COLECTOMY  2003   total colectomy with J pouch      Current Outpatient Medications  Medication Sig Dispense Refill  . aspirin 81 MG tablet Take 81 mg by mouth daily.      . cetirizine (ZYRTEC) 10 MG tablet Take 10 mg by mouth daily.    . flecainide (TAMBOCOR) 50 MG tablet Take 1 tablet (50 mg total) by mouth 2 (two) times daily. 60 tablet 6  . fluticasone (FLONASE) 50 MCG/ACT nasal spray Place 1 spray into both nostrils daily.    Marland Kitchen omeprazole (PRILOSEC) 40 MG capsule Take 1 capsule (40 mg total) by mouth daily. 90 capsule 0  . propranolol (INDERAL) 10 MG tablet Take 1 tablet (10 mg total) by mouth 2 (two) times daily. 180 tablet 3   Current Facility-Administered Medications  Medication Dose Route Frequency Provider Last Rate Last Dose  . 0.9 %  sodium chloride infusion  500 mL Intravenous Once Ladene Artist, MD        Allergies:   Patient has no known allergies.   Social History:  The patient  reports that  has never smoked. he has never used smokeless tobacco. He reports that he drinks about 1.2 oz of alcohol per week. He reports that he does not  use drugs.   Family History:  The patient's family history includes Cancer in his mother; Heart attack (age of onset: 38) in his father; Lymphoma (age of onset: 58) in his brother; Stomach cancer (age of onset: 70) in his father.    ROS:  Please see the history of present illness.   Otherwise, review of systems is positive for palpitations.   All other systems are reviewed and negative.    PHYSICAL EXAM: VS:  BP 110/70   Pulse (!) 54   Ht 6' 3"  (1.905 m)   Wt 220 lb (99.8 kg)   BMI 27.50 kg/m  , BMI Body mass index is 27.5 kg/m. GEN: Well nourished, well developed, in no acute distress  HEENT: normal  Neck: no JVD, carotid bruits, or masses Cardiac: RRR; no murmurs, rubs, or gallops,no edema  Respiratory:  clear to auscultation bilaterally, normal work of  breathing GI: soft, nontender, nondistended, + BS MS: no deformity or atrophy  Skin: warm and dry Neuro:  Strength and sensation are intact Psych: euthymic mood, full affect  EKG:  EKG is not ordered today. Personal review of the ekg ordered 11/14/18shows sinus rhythm, rate 52  Recent Labs: 01/27/2017: ALT 28; BUN 20; Creatinine, Ser 1.02; Hemoglobin 15.6; Platelets 236.0; Potassium 4.1; Sodium 141; TSH 1.45    Lipid Panel     Component Value Date/Time   CHOL 169 01/27/2017 0820   TRIG 115.0 01/27/2017 0820   TRIG 74 04/10/2006 0808   HDL 52.00 01/27/2017 0820   CHOLHDL 3 01/27/2017 0820   VLDL 23.0 01/27/2017 0820   LDLCALC 94 01/27/2017 0820   LDLDIRECT 123.3 03/02/2009 0832     Wt Readings from Last 3 Encounters:  05/31/17 220 lb (99.8 kg)  05/24/17 223 lb (101.2 kg)  05/04/17 223 lb 3.2 oz (101.2 kg)      Other studies Reviewed: Additional studies/ records that were reviewed today include: TTE 03/16/17  Review of the above records today demonstrates:  - Left ventricle: The cavity size was normal. Systolic function was   normal. The estimated ejection fraction was in the range of 60%   to 65%. Wall motion was normal; there were no regional wall   motion abnormalities. - Atrial septum: No defect or patent foramen ovale was identified. - Left atrium:  The atrium was normal in size.  Myoview 05/24/17 - personally reviewed  There was no ST segment deviation noted during stress. AFIB.  This is a low risk study. No perfusion defects. No ischemia.  ASSESSMENT AND PLAN:  1.  Nonsustained VT: Had runs of 5-20 beats on cardiac monitor.  Has had a normal EF with a low risk Myoview.  He has since been put on flecainide.  That being said, he has yet to start the medication.  He will started tonight to see if he has any further issues.  2.  SVT: Is difficult to tell if his SVT is AVNRT versus ORT based on his monitor.  He does continue to have symptoms of palpitations.   Planning to start flecainide tonight.  3.  Paroxysmal atrial fibrillation/atrial flutter: Found on cardiac monitor.  Fortunately he has a low stroke risk.  On propranolol and flecainide to start tonight.  Current medicines are reviewed at length with the patient today.   The patient does not have concerns regarding his medicines.  The following changes were made today:  none  Labs/ tests ordered today include:  Orders Placed This Encounter  Procedures  . Exercise  Tolerance Test   Case discussed with primary cardiology  Disposition:   FU with Will Camnitz 3 months  Signed, Will Meredith Leeds, MD  05/31/2017 9:30 AM     [facility] [address] [place] [address] [phone] (office) [phone] (fax)

## 2017-05-31 ENCOUNTER — Encounter: Payer: Self-pay | Admitting: Cardiology

## 2017-05-31 ENCOUNTER — Ambulatory Visit: Payer: 59 | Admitting: Cardiology

## 2017-05-31 VITALS — BP 110/70 | HR 54 | Ht 75.0 in | Wt 220.0 lb

## 2017-05-31 DIAGNOSIS — I472 Ventricular tachycardia, unspecified: Secondary | ICD-10-CM

## 2017-05-31 DIAGNOSIS — I480 Paroxysmal atrial fibrillation: Secondary | ICD-10-CM | POA: Diagnosis not present

## 2017-05-31 DIAGNOSIS — I471 Supraventricular tachycardia: Secondary | ICD-10-CM

## 2017-05-31 NOTE — Patient Instructions (Addendum)
Medication Instructions:  Your physician has recommended you make the following change in your medication: 1. START Flecainide (you will start this medication 7-10 days before the stress test)  * If you need a refill on your cardiac medications before your next appointment, please call your pharmacy. *  Labwork: None ordered  Testing/Procedures: Your physician has requested that you have an exercise tolerance test in 7-10 days from now. For further information please visit [email or web address]. Please also follow instruction sheet, as given.  Follow-Up: Your physician recommends that you schedule a follow-up appointment in: 3 months with Dr. Curt Bears.   Thank you for choosing [facility]!!   Trinidad Curet, RN [phone]  Any Other Special Instructions Will Be Listed Below (If Applicable).   Exercise Stress Electrocardiogram An exercise stress electrocardiogram is a test that is done to evaluate the blood supply to your heart. This test may also be called exercise stress electrocardiography. The test is done while you are walking on a treadmill. The goal of this test is to raise your heart rate. This test is done to find areas of poor blood flow to the heart by determining the extent of coronary artery disease (CAD). CAD is defined as narrowing in one or more heart (coronary) arteries of more than 70%. If you have an abnormal test result, this may mean that you are not getting adequate blood flow to your heart during exercise. Additional testing may be needed to understand why your test was abnormal. Tell a health care provider about:  Any allergies you have.  All medicines you are taking, including vitamins, herbs, eye drops, creams, and over-the-counter medicines.  Any problems you or family members have had with anesthetic medicines.  Any blood disorders you have.  Any surgeries you have had.  Any medical conditions you have.  Possibility of pregnancy, if this  applies. What are the risks? Generally, this is a safe procedure. However, as with any procedure, complications can occur. Possible complications can include:  Pain or pressure in the following areas: ? Chest. ? Jaw or neck. ? Between your shoulder blades. ? Radiating down your left arm.  Dizziness or light-headedness.  Shortness of breath.  Increased or irregular heartbeats.  Nausea or vomiting.  Heart attack (rare).  What happens before the procedure?  Avoid all forms of caffeine 24 hours before your test or as directed by your health care provider. This includes coffee, tea (even decaffeinated tea), caffeinated sodas, chocolate, cocoa, and certain pain medicines.  Follow your health care provider's instructions regarding eating and drinking before the test.  Take your medicines as directed at regular times with water unless instructed otherwise. Exceptions may include: ? If you have diabetes, ask how you are to take your insulin or pills. It is common to adjust insulin dosing the morning of the test. ? If you are taking beta-blocker medicines, it is important to talk to your health care provider about these medicines well before the date of your test. Taking beta-blocker medicines may interfere with the test. In some cases, these medicines need to be changed or stopped 24 hours or more before the test. ? If you wear a nitroglycerin patch, it may need to be removed prior to the test. Ask your health care provider if the patch should be removed before the test.  If you use an inhaler for any breathing condition, bring it with you to the test.  If you are an outpatient, bring a snack so you can eat right  after the stress phase of the test.  Do not smoke for 4 hours prior to the test or as directed by your health care provider.  Do not apply lotions, powders, creams, or oils on your chest prior to the test.  Wear loose-fitting clothes and comfortable shoes for the test. This  test involves walking on a treadmill. What happens during the procedure?  Multiple patches (electrodes) will be put on your chest. If needed, small areas of your chest may have to be shaved to get better contact with the electrodes. Once the electrodes are attached to your body, multiple wires will be attached to the electrodes and your heart rate will be monitored.  Your heart will be monitored both at rest and while exercising.  You will walk on a treadmill. The treadmill will be started at a slow pace. The treadmill speed and incline will gradually be increased to raise your heart rate. What happens after the procedure?  Your heart rate and blood pressure will be monitored after the test.  You may return to your normal schedule including diet, activities, and medicines, unless your health care provider tells you otherwise. This information is not intended to replace advice given to you by your health care provider. Make sure you discuss any questions you have with your health care provider. Document Released: 03/11/2000 Document Revised: 08/20/2015 Document Reviewed: 11/19/2012 Elsevier Interactive Patient Education  2017 [employer].

## 2017-06-03 ENCOUNTER — Other Ambulatory Visit: Payer: Self-pay | Admitting: Nurse Practitioner

## 2017-06-15 ENCOUNTER — Ambulatory Visit (INDEPENDENT_AMBULATORY_CARE_PROVIDER_SITE_OTHER): Payer: 59

## 2017-06-15 DIAGNOSIS — I471 Supraventricular tachycardia: Secondary | ICD-10-CM | POA: Diagnosis not present

## 2017-06-15 DIAGNOSIS — I472 Ventricular tachycardia, unspecified: Secondary | ICD-10-CM

## 2017-06-15 DIAGNOSIS — I480 Paroxysmal atrial fibrillation: Secondary | ICD-10-CM | POA: Diagnosis not present

## 2017-06-15 LAB — EXERCISE TOLERANCE TEST
CHL CUP RESTING HR STRESS: 42 {beats}/min
CSEPED: 6 min
CSEPEW: 8 METS
Exercise duration (sec): 40 s
MPHR: 162 {beats}/min
Peak HR: 117 {beats}/min
Percent HR: 72 %
RPE: 17

## 2017-07-17 ENCOUNTER — Other Ambulatory Visit: Payer: Self-pay | Admitting: Nurse Practitioner

## 2017-07-18 DIAGNOSIS — I4891 Unspecified atrial fibrillation: Secondary | ICD-10-CM | POA: Diagnosis not present

## 2017-07-18 DIAGNOSIS — J019 Acute sinusitis, unspecified: Secondary | ICD-10-CM | POA: Diagnosis not present

## 2017-09-04 ENCOUNTER — Encounter: Payer: Self-pay | Admitting: Cardiology

## 2017-09-04 ENCOUNTER — Ambulatory Visit: Payer: 59 | Admitting: Cardiology

## 2017-09-04 VITALS — BP 100/72 | HR 40 | Ht 75.0 in | Wt 223.2 lb

## 2017-09-04 DIAGNOSIS — I472 Ventricular tachycardia: Secondary | ICD-10-CM | POA: Diagnosis not present

## 2017-09-04 DIAGNOSIS — I471 Supraventricular tachycardia: Secondary | ICD-10-CM

## 2017-09-04 DIAGNOSIS — I480 Paroxysmal atrial fibrillation: Secondary | ICD-10-CM

## 2017-09-04 DIAGNOSIS — I4729 Other ventricular tachycardia: Secondary | ICD-10-CM

## 2017-09-04 MED ORDER — PROPRANOLOL HCL 10 MG PO TABS: 10.0000 mg | ORAL_TABLET | Freq: Three times a day (TID) | ORAL | 2 refills | Status: DC

## 2017-09-04 MED ORDER — FLECAINIDE ACETATE 50 MG PO TABS: 50.0000 mg | ORAL_TABLET | Freq: Two times a day (BID) | ORAL | 2 refills | Status: DC

## 2017-09-04 NOTE — Progress Notes (Signed)
Electrophysiology Office Note   Date:  09/04/2017   ID:  Joshua Li, DOB [birth year], MRN [id number]  PCP:  Lance Sell, NP  Cardiologist:  Ellyn Hack Primary Electrophysiologist:  Keagon Glascoe Meredith Leeds, MD    No chief complaint on file.    History of Present Illness: Joshua Li is a 57 y.o. male who is being seen today for the evaluation of palpitations at the request of Glenetta Hew. Presenting today for electrophysiology evaluation.  He has a history of  hyperlipidemia.  He was seen October 2018 and was noted to have intermittent palpitations on and off for 8 years.  He wore a 30-day monitor that showed multiple arrhythmias including PAT, PVCs, possible atrial flutter as well as nonsustained VT and short runs.  He had an echo that showed a normal EF and a low risk Myoview.  He has had near syncope in the past but no syncope.  He was since put on flecainide and propranolol.    Today, denies symptoms of palpitations, chest pain, shortness of breath, orthopnea, PND, lower extremity edema, claudication, dizziness, presyncope, syncope, bleeding, or neurologic sequela. The patient is tolerating medications without difficulties.  Overall he is doing well.  He has noted no further episodes since starting his propranolol and flecainide.  He is tolerating the medications well without issue.   Past Medical History:  Diagnosis Date  . Allergy   . Asthma   . GERD (gastroesophageal reflux disease)   . Heart murmur   . Hiatal hernia   . Hyperlipidemia   . Ulcerative colitis 2001   Status post total colectomy with J-pouch in 2003 -    Past Surgical History:  Procedure Laterality Date  . CHOLECYSTECTOMY    . COLECTOMY  2003   with one step take-down to J-pouch  . COLECTOMY    . COLECTOMY  2003   total colectomy with J pouch (one step procedure)  . COLECTOMY  2003   total colectomy with j pouch ( one step)  . TONSILLECTOMY    . TOTAL COLECTOMY  2003   total colectomy with J  pouch      Current Outpatient Medications  Medication Sig Dispense Refill  . aspirin 81 MG tablet Take 81 mg by mouth daily.      . cetirizine (ZYRTEC) 10 MG tablet Take 10 mg by mouth daily.    . flecainide (TAMBOCOR) 50 MG tablet Take 1 tablet (50 mg total) by mouth 2 (two) times daily. 60 tablet 6  . fluticasone (FLONASE) 50 MCG/ACT nasal spray Place 1 spray into both nostrils daily.    Marland Kitchen omeprazole (PRILOSEC) 40 MG capsule TAKE 1 CAPSULE BY MOUTH  DAILY 90 capsule 0  . propranolol (INDERAL) 10 MG tablet TAKE 1 TABLET BY MOUTH 3  TIMES DAILY 270 tablet 0   Current Facility-Administered Medications  Medication Dose Route Frequency Provider Last Rate Last Dose  . 0.9 %  sodium chloride infusion  500 mL Intravenous Once Ladene Artist, MD        Allergies:   Patient has no known allergies.   Social History:  The patient  reports that he has never smoked. He has never used smokeless tobacco. He reports that he drinks about 1.2 oz of alcohol per week. He reports that he does not use drugs.   Family History:  The patient's family history includes Cancer in his mother; Heart attack (age of onset: 87) in his father; Lymphoma (age of onset: 30) in  his brother; Stomach cancer (age of onset: 38) in his father.    ROS:  Please see the history of present illness.   Otherwise, review of systems is positive for none.   All other systems are reviewed and negative.   PHYSICAL EXAM: VS:  BP 100/72   Pulse (!) 40   Ht 6' 3"  (1.905 m)   Wt 223 lb 3.2 oz (101.2 kg)   BMI 27.90 kg/m  , BMI Body mass index is 27.9 kg/m. GEN: Well nourished, well developed, in no acute distress  HEENT: normal  Neck: no JVD, carotid bruits, or masses Cardiac: RRR; no murmurs, rubs, or gallops,no edema  Respiratory:  clear to auscultation bilaterally, normal work of breathing GI: soft, nontender, nondistended, + BS MS: no deformity or atrophy  Skin: warm and dry Neuro:  Strength and sensation are intact Psych:  euthymic mood, full affect  EKG:  EKG is ordered today. Personal review of the ekg ordered shows SR, rate 40   Recent Labs: 01/27/2017: ALT 28; BUN 20; Creatinine, Ser 1.02; Hemoglobin 15.6; Platelets 236.0; Potassium 4.1; Sodium 141; TSH 1.45    Lipid Panel     Component Value Date/Time   CHOL 169 01/27/2017 0820   TRIG 115.0 01/27/2017 0820   TRIG 74 04/10/2006 0808   HDL 52.00 01/27/2017 0820   CHOLHDL 3 01/27/2017 0820   VLDL 23.0 01/27/2017 0820   LDLCALC 94 01/27/2017 0820   LDLDIRECT 123.3 03/02/2009 0832     Wt Readings from Last 3 Encounters:  09/04/17 223 lb 3.2 oz (101.2 kg)  05/31/17 220 lb (99.8 kg)  05/24/17 223 lb (101.2 kg)      Other studies Reviewed: Additional studies/ records that were reviewed today include: TTE 03/16/17  Review of the above records today demonstrates:  - Left ventricle: The cavity size was normal. Systolic function was   normal. The estimated ejection fraction was in the range of 60%   to 65%. Wall motion was normal; there were no regional wall   motion abnormalities. - Atrial septum: No defect or patent foramen ovale was identified. - Left atrium:  The atrium was normal in size.  Myoview 05/24/17  There was no ST segment deviation noted during stress. AFIB.  This is a low risk study. No perfusion defects. No ischemia.  ETT 06/15/17 - personally reviewed  Blood pressure demonstrated a normal response to exercise.  Upsloping ST segment depression ST segment depression of 1 mm was noted during stress in the II, III, aVF, V5 and V6 leads.   Positive ETT 1 mm upsloping ST segment depression in inferior lateral leads with stress Normal hemodynamic response  ASSESSMENT AND PLAN:  1.  Nonsustained VT: Had 5-20 beats on cardiac monitor.  He has a normal ejection fraction and low risk Myoview.  Was put on flecainide and propranolol at the last visit.  Is tolerated this well.  No changes.  2.  SVT: No further symptoms since  starting flecainide.  No changes.  3.  Paroxysmal atrial fibrillation/atrial flutter: Found on monitor.  Low risk for stroke.  Not anticoagulated.  Currently on propranolol and flecainide.  This patients CHA2DS2-VASc Score and unadjusted Ischemic Stroke Rate (% per year) is equal to 0.2 % stroke rate/year from a score of 0  Above score calculated as 1 point each if present [CHF, HTN, DM, Vascular=MI/PAD/Aortic Plaque, Age if 65-74, or Male] Above score calculated as 2 points each if present [Age > 75, or Stroke/TIA/TE]   Current  medicines are reviewed at length with the patient today.   The patient does not have concerns regarding his medicines.  The following changes were made today: None  Labs/ tests ordered today include:  Orders Placed This Encounter  Procedures  . EKG 12-Lead    Disposition:   FU with Chinmayi Rumer 6 months  Signed, Don Tiu Meredith Leeds, MD  09/04/2017 8:44 AM     [facility] [address] [place] [address] [phone] (office) [phone] (fax)

## 2017-09-04 NOTE — Patient Instructions (Signed)
Medication Instructions:  Your physician recommends that you continue on your current medications as directed. Please refer to the Current Medication list given to you today.  Labwork: None ordered     *We will only notify you of abnormal results, otherwise continue current treatment plan.  Testing/Procedures: None ordered  Follow-Up: Your physician wants you to follow-up in: 6 months  with Dr. Curt Bears.  You will receive a reminder letter in the mail two months in advance. If you don't receive a letter, please call our office to schedule the follow-up appointment.   * If you need a refill on your cardiac medications before your next appointment, please call your pharmacy.   *Please note that any paperwork needing to be filled out by the provider will need to be addressed at the front desk prior to seeing the provider. Please note that any FMLA, disability or other documents regarding health condition is subject to a $25.00 charge that must be received prior to completion of paperwork in the form of a money order or check.  Thank you for choosing [facility]!!   Trinidad Curet, RN [phone]

## 2017-10-14 ENCOUNTER — Other Ambulatory Visit: Payer: Self-pay | Admitting: Nurse Practitioner

## 2018-01-04 DIAGNOSIS — J302 Other seasonal allergic rhinitis: Secondary | ICD-10-CM | POA: Diagnosis not present

## 2018-01-15 ENCOUNTER — Other Ambulatory Visit: Payer: Self-pay | Admitting: Family

## 2018-01-24 ENCOUNTER — Encounter: Payer: 59 | Admitting: Nurse Practitioner

## 2018-02-02 ENCOUNTER — Ambulatory Visit (INDEPENDENT_AMBULATORY_CARE_PROVIDER_SITE_OTHER): Payer: 59 | Admitting: Nurse Practitioner

## 2018-02-02 ENCOUNTER — Encounter: Payer: Self-pay | Admitting: Nurse Practitioner

## 2018-02-02 ENCOUNTER — Other Ambulatory Visit (INDEPENDENT_AMBULATORY_CARE_PROVIDER_SITE_OTHER): Payer: 59

## 2018-02-02 VITALS — BP 120/60 | HR 44 | Ht 75.0 in | Wt 228.0 lb

## 2018-02-02 DIAGNOSIS — Z0000 Encounter for general adult medical examination without abnormal findings: Secondary | ICD-10-CM

## 2018-02-02 DIAGNOSIS — Z13220 Encounter for screening for lipoid disorders: Secondary | ICD-10-CM

## 2018-02-02 DIAGNOSIS — Z125 Encounter for screening for malignant neoplasm of prostate: Secondary | ICD-10-CM

## 2018-02-02 DIAGNOSIS — K219 Gastro-esophageal reflux disease without esophagitis: Secondary | ICD-10-CM | POA: Diagnosis not present

## 2018-02-02 LAB — COMPREHENSIVE METABOLIC PANEL
ALK PHOS: 42 U/L (ref 39–117)
ALT: 14 U/L (ref 0–53)
AST: 12 U/L (ref 0–37)
Albumin: 4.1 g/dL (ref 3.5–5.2)
BUN: 14 mg/dL (ref 6–23)
CALCIUM: 8.3 mg/dL — AB (ref 8.4–10.5)
CO2: 28 meq/L (ref 19–32)
CREATININE: 1.1 mg/dL (ref 0.40–1.50)
Chloride: 107 mEq/L (ref 96–112)
GFR: 72.79 mL/min (ref >60.00)
GLUCOSE: 96 mg/dL (ref 70–99)
Potassium: 3.6 mEq/L (ref 3.5–5.1)
Sodium: 143 mEq/L (ref 135–145)
TOTAL PROTEIN: 6.8 g/dL (ref 6.0–8.3)
Total Bilirubin: 0.9 mg/dL (ref 0.2–1.2)

## 2018-02-02 LAB — CBC
HCT: 42.2 % (ref 39.0–52.0)
Hemoglobin: 15.2 g/dL (ref 13.0–17.0)
MCHC: 36 g/dL (ref 30.0–36.0)
MCV: 87.3 fl (ref 78.0–100.0)
PLATELETS: 215 10*3/uL (ref 150.0–400.0)
RBC: 4.84 Mil/uL (ref 4.22–5.81)
RDW: 13.6 % (ref 11.5–15.5)
WBC: 7.6 10*3/uL (ref 4.0–10.5)

## 2018-02-02 LAB — LIPID PANEL
CHOLESTEROL: 173 mg/dL (ref 0–200)
HDL: 52.9 mg/dL (ref >39.00)
LDL Cholesterol: 106 mg/dL — ABNORMAL HIGH (ref 0–99)
NonHDL: 120.54
Total CHOL/HDL Ratio: 3
Triglycerides: 75 mg/dL (ref 0.0–149.0)
VLDL: 15 mg/dL (ref 0.0–40.0)

## 2018-02-02 LAB — PSA: PSA: 0.6 ng/mL (ref 0.10–4.00)

## 2018-02-02 LAB — MAGNESIUM: MAGNESIUM: 1.2 mg/dL — AB (ref 1.5–2.5)

## 2018-02-02 NOTE — Assessment & Plan Note (Signed)
Reviewed annual screening exams, healthy lifestyle, additional information provided on AVS - CBC; Future - Comprehensive metabolic panel; Future - PSA; Future - Lipid panel; Future - Magnesium; Future  Screening for prostate cancer- PSA; Future  Screening for cholesterol level- Lipid panel; Future

## 2018-02-02 NOTE — Assessment & Plan Note (Signed)
Stable Continue prilosec Update labs F/U for new, worsening symptoms - CBC; Future - Magnesium; Future

## 2018-02-02 NOTE — Patient Instructions (Signed)
Head downstairs for labs  I will plan to see you in 1 year for your annual, sooner if needed!   Health Maintenance, Male A healthy lifestyle and preventive care is important for your health and wellness. Ask your health care provider about what schedule of regular examinations is right for you. What should I know about weight and diet? Eat a Healthy Diet  Eat plenty of vegetables, fruits, whole grains, low-fat dairy products, and lean protein.  Do not eat a lot of foods high in solid fats, added sugars, or salt.  Maintain a Healthy Weight Regular exercise can help you achieve or maintain a healthy weight. You should:  Do at least 150 minutes of exercise each week. The exercise should increase your heart rate and make you sweat (moderate-intensity exercise).  Do strength-training exercises at least twice a week.  Watch Your Levels of Cholesterol and Blood Lipids  Have your blood tested for lipids and cholesterol every 5 years starting at 59 years of age. If you are at high risk for heart disease, you should start having your blood tested when you are 59 years old. You may need to have your cholesterol levels checked more often if: ? Your lipid or cholesterol levels are high. ? You are older than 59 years of age. ? You are at high risk for heart disease.  What should I know about cancer screening? Many types of cancers can be detected early and may often be prevented. Lung Cancer  You should be screened every year for lung cancer if: ? You are a current smoker who has smoked for at least 30 years. ? You are a former smoker who has quit within the past 15 years.  Talk to your health care provider about your screening options, when you should start screening, and how often you should be screened.  Colorectal Cancer  Routine colorectal cancer screening usually begins at 59 years of age and should be repeated every 5-10 years until you are 59 years old. You may need to be screened  more often if early forms of precancerous polyps or small growths are found. Your health care provider may recommend screening at an earlier age if you have risk factors for colon cancer.  Your health care provider may recommend using home test kits to check for hidden blood in the stool.  A small camera at the end of a tube can be used to examine your colon (sigmoidoscopy or colonoscopy). This checks for the earliest forms of colorectal cancer.  Prostate and Testicular Cancer  Depending on your age and overall health, your health care provider may do certain tests to screen for prostate and testicular cancer.  Talk to your health care provider about any symptoms or concerns you have about testicular or prostate cancer.  Skin Cancer  Check your skin from head to toe regularly.  Tell your health care provider about any new moles or changes in moles, especially if: ? There is a change in a mole's size, shape, or color. ? You have a mole that is larger than a pencil eraser.  Always use sunscreen. Apply sunscreen liberally and repeat throughout the day.  Protect yourself by wearing long sleeves, pants, a wide-brimmed hat, and sunglasses when outside.  What should I know about heart disease, diabetes, and high blood pressure?  If you are 49-34 years of age, have your blood pressure checked every 3-5 years. If you are 39 years of age or older, have your blood pressure  checked every year. You should have your blood pressure measured twice-once when you are at a hospital or clinic, and once when you are not at a hospital or clinic. Record the average of the two measurements. To check your blood pressure when you are not at a hospital or clinic, you can use: ? An automated blood pressure machine at a pharmacy. ? A home blood pressure monitor.  Talk to your health care provider about your target blood pressure.  If you are between 57-21 years old, ask your health care provider if you should  take aspirin to prevent heart disease.  Have regular diabetes screenings by checking your fasting blood sugar level. ? If you are at a normal weight and have a low risk for diabetes, have this test once every three years after the age of 64. ? If you are overweight and have a high risk for diabetes, consider being tested at a younger age or more often.  A one-time screening for abdominal aortic aneurysm (AAA) by ultrasound is recommended for men aged 68-75 years who are current or former smokers. What should I know about preventing infection? Hepatitis B If you have a higher risk for hepatitis B, you should be screened for this virus. Talk with your health care provider to find out if you are at risk for hepatitis B infection. Hepatitis C Blood testing is recommended for:  Everyone born from [birth year] through 1967.  Anyone with known risk factors for hepatitis C.  Sexually Transmitted Diseases (STDs)  You should be screened each year for STDs including gonorrhea and chlamydia if: ? You are sexually active and are younger than 59 years of age. ? You are older than 59 years of age and your health care provider tells you that you are at risk for this type of infection. ? Your sexual activity has changed since you were last screened and you are at an increased risk for chlamydia or gonorrhea. Ask your health care provider if you are at risk.  Talk with your health care provider about whether you are at high risk of being infected with HIV. Your health care provider may recommend a prescription medicine to help prevent HIV infection.  What else can I do?  Schedule regular health, dental, and eye exams.  Stay current with your vaccines (immunizations).  Do not use any tobacco products, such as cigarettes, chewing tobacco, and e-cigarettes. If you need help quitting, ask your health care provider.  Limit alcohol intake to no more than 2 drinks per day. One drink equals 12 ounces of beer, 5  ounces of wine, or 1 ounces of hard liquor.  Do not use street drugs.  Do not share needles.  Ask your health care provider for help if you need support or information about quitting drugs.  Tell your health care provider if you often feel depressed.  Tell your health care provider if you have ever been abused or do not feel safe at home. This information is not intended to replace advice given to you by your health care provider. Make sure you discuss any questions you have with your health care provider. Document Released: 09/10/2007 Document Revised: 11/11/2015 Document Reviewed: 12/16/2014 Elsevier Interactive Patient Education  [employer].

## 2018-02-02 NOTE — Progress Notes (Signed)
Joshua Li is a 57 y.o. male with the following history as recorded in [employer]:  Patient Active Problem List   Diagnosis Date Noted  . Paroxysmal atrial fibrillation ([place]) 05/04/2017  . Hyperlipidemia   . Hiatal hernia   . GERD (gastroesophageal reflux disease)   . Allergy   . Non-sustained ventricular tachycardia (H[facility]) - 5-20 beat runs 02/27/2017  . PAT (paroxysmal atrial tachycardia) ([facility]) - vs. >? Atrial Flutter 02/27/2017  . Heart palpitations 05/13/2016  . Asthma with acute exacerbation 07/04/2015  . Acute bronchitis 07/04/2015  . Acute frontal sinusitis 03/25/2015  . Routine general medical examination at a health care facility 12/23/2014  . Allergic rhinitis 06/25/2010  . Actinic keratosis 06/25/2010  . CONTACT DERMATITIS&OTHER ECZEMA DUE UNSPEC CAUSE 09/07/2009  . COUGH VARIANT ASTHMA 06/10/2009  . COUGH 06/10/2009  . Near syncope 05/19/2009  . PALPITATIONS, OCCASIONAL 05/19/2009  . DERMATITIS, ATOPIC 10/04/2007  . Hyperlipidemia, unspecified 01/22/2007  . ASTHMA 01/22/2007  . GERD 01/22/2007  . 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 GASTROINTESTINAL HEMORRHAGE, HX OF 01/22/2007    Current Outpatient Medications  Medication Sig Dispense Refill  . aspirin 81 MG tablet Take 81 mg by mouth daily.      . cetirizine (ZYRTEC) 10 MG tablet Take 10 mg by mouth daily.    . flecainide (TAMBOCOR) 50 MG tablet Take 1 tablet (50 mg total) by mouth 2 (two) times daily. 180 tablet 2  . fluticasone (FLONASE) 50 MCG/ACT nasal spray Place 1 spray into both nostrils daily.    Marland Kitchen omeprazole (PRILOSEC) 40 MG capsule TAKE 1 CAPSULE BY MOUTH  DAILY 90 capsule 0  . propranolol (INDERAL) 10 MG tablet Take 1 tablet (10 mg total) by mouth 3 (three) times daily. 270 tablet 2   Current Facility-Administered Medications  Medication Dose Route Frequency Provider Last Rate Last Dose  . 0.9 %  sodium chloride infusion  500 mL Intravenous Once Ladene Artist, MD         Allergies: Patient has no known allergies.  Past Medical History:  Diagnosis Date  . Allergy   . Asthma   . GERD (gastroesophageal reflux disease)   . Heart murmur   . Hiatal hernia   . Hyperlipidemia   . Ulcerative colitis 2001   Status post total colectomy with J-pouch in 2003 -     Past Surgical History:  Procedure Laterality Date  . CHOLECYSTECTOMY    . COLECTOMY  2003   with one step take-down to J-pouch  . COLECTOMY    . COLECTOMY  2003   total colectomy with J pouch (one step procedure)  . COLECTOMY  2003   total colectomy with j pouch ( one step)  . TONSILLECTOMY    . TOTAL COLECTOMY  2003   total colectomy with J pouch     Family History  Problem Relation Age of Onset  . Cancer Mother        breast and ovarian  . Heart attack Father 17  . Stomach cancer Father 78       stomach & bladder -cause of death  . Lymphoma Brother 65       Currently 81  . Colon cancer Neg Hx   . Esophageal cancer Neg Hx   . Pancreatic cancer Neg Hx   . Rectal cancer Neg Hx     Social History   Tobacco Use  . Smoking status: Never Smoker  . Smokeless tobacco: Never Used  Substance Use  Topics  . Alcohol use: Yes    Alcohol/week: 2.0 standard drinks    Types: 2 Cans of beer per week     Subjective:  Here today for CPE  Last dental exam: 2019 Last vision exam: 2018 PSA: ordered Lung ca screening: never a smoker Colonoscopy: up to date Lipids: lipid panel today Vaccinations: up to date Diet and exercise: no regular diet, exercise; would like to be healthier but says he does not really feel motivated  Review of Systems  Constitutional: Negative for chills and fever.  HENT: Negative for hearing loss.   Eyes: Negative for blurred vision and double vision.  Respiratory: Negative for cough and shortness of breath.   Cardiovascular: Negative for chest pain.  Gastrointestinal: Negative for constipation, diarrhea, heartburn, nausea and vomiting.  Genitourinary: Negative for  dysuria and hematuria.  Musculoskeletal: Negative for falls.  Skin: Negative for rash.  Neurological: Negative for speech change, loss of consciousness and headaches.  Endo/Heme/Allergies: Negative for environmental allergies. Does not bruise/bleed easily.  Psychiatric/Behavioral: Negative for depression. The patient is not nervous/anxious.    Maintained on prilosec 40 daily for GERD, takes daily as prescribed but does notice symptoms in the case he ever does miss a dose  Objective:  Vitals:   02/02/18 0849  BP: 120/60  Pulse: (!) 44  SpO2: 98%  Weight: 228 lb (103.4 kg)  Height: 6' 3"  (1.905 m)   hr stable, followed by cardiology  General: Well developed, well nourished, in no acute distress  Skin : Warm and dry.  Head: Normocephalic and atraumatic  Eyes: Sclera and conjunctiva clear; pupils round and reactive to light; extraocular movements intact  Ears: External normal; canals clear; tympanic membranes normal  Oropharynx: Pink, supple. No suspicious lesions  Neck: Supple without thyromegaly, adenopathy  Lungs: Respirations unlabored; clear to auscultation bilaterally without wheeze, rales, rhonchi  CVS exam: slow rate and regular rhythm, S1 and S2 normal.  Abdomen: Soft; nontender; nondistended; normoactive bowel sounds; no masses or hepatosplenomegaly  Musculoskeletal: No deformities; no active joint inflammation  Extremities: No edema, cyanosis, clubbing  Vessels: Symmetric bilaterally  Neurologic: Alert and oriented; speech intact; face symmetrical; moves all extremities well; CNII-XII intact without focal deficit  Psychiatric: Normal mood and affect.  Assessment:  1. Routine general medical examination at a health care facility   2. Screening for prostate cancer   3. Screening for cholesterol level   4. Gastroesophageal reflux disease, esophagitis presence not specified     Plan:   Return in about 1 year (around 02/03/2019) for CPE.  Orders Placed This Encounter   Procedures  . CBC    Standing Status:   Future    Standing Expiration Date:   02/03/2019  . Comprehensive metabolic panel    Standing Status:   Future    Standing Expiration Date:   02/03/2019  . PSA    Standing Status:   Future    Standing Expiration Date:   02/03/2019  . Lipid panel    Standing Status:   Future    Standing Expiration Date:   02/03/2019  . Magnesium    Standing Status:   Future    Standing Expiration Date:   04/04/2018    Requested Prescriptions    No prescriptions requested or ordered in this encounter

## 2018-02-08 ENCOUNTER — Other Ambulatory Visit: Payer: Self-pay | Admitting: Family

## 2018-02-08 MED ORDER — MAGNESIUM OXIDE 400 MG PO TABS: 400.0000 mg | ORAL_TABLET | Freq: Every day | ORAL | 0 refills | Status: DC

## 2018-04-09 ENCOUNTER — Ambulatory Visit (INDEPENDENT_AMBULATORY_CARE_PROVIDER_SITE_OTHER): Payer: 59 | Admitting: Cardiology

## 2018-04-09 ENCOUNTER — Other Ambulatory Visit: Payer: Self-pay | Admitting: Nurse Practitioner

## 2018-04-09 ENCOUNTER — Encounter: Payer: Self-pay | Admitting: Cardiology

## 2018-04-09 VITALS — BP 124/82 | HR 46 | Ht 75.0 in | Wt 226.0 lb

## 2018-04-09 DIAGNOSIS — I480 Paroxysmal atrial fibrillation: Secondary | ICD-10-CM | POA: Diagnosis not present

## 2018-04-09 DIAGNOSIS — I472 Ventricular tachycardia, unspecified: Secondary | ICD-10-CM

## 2018-04-09 DIAGNOSIS — I471 Supraventricular tachycardia: Secondary | ICD-10-CM | POA: Diagnosis not present

## 2018-04-09 MED ORDER — OMEPRAZOLE 40 MG PO CPDR: 40.0000 mg | DELAYED_RELEASE_CAPSULE | Freq: Every day | ORAL | 1 refills | Status: DC

## 2018-04-09 NOTE — Patient Instructions (Addendum)
Medication Instructions:  Your physician recommends that you continue on your current medications as directed. Please refer to the Current Medication list given to you today.  * If you need a refill on your cardiac medications before your next appointment, please call your pharmacy.   Labwork: None ordered  Testing/Procedures: None ordered  Follow-Up: Your physician wants you to follow-up in: 1 year with Dr. Camnitz.  You will receive a reminder letter in the mail two months in advance. If you don't receive a letter, please call our office to schedule the follow-up appointment.  Thank you for choosing [facility]!!   Sherri Price, RN [phone]        

## 2018-04-09 NOTE — Progress Notes (Signed)
Electrophysiology Office Note   Date:  04/09/2018   ID:  Joshua Li, DOB 12-27-1960, MRN [id number]  PCP:  Lance Sell, NP  Cardiologist:  Ellyn Hack Primary Electrophysiologist:  Will Meredith Leeds, MD    No chief complaint on file.    History of Present Illness: Joshua Li is a 58 y.o. male who is being seen today for the evaluation of palpitations at the request of Glenetta Hew. Presenting today for electrophysiology evaluation.  He has a history of  hyperlipidemia.  He was seen October 2018 and was noted to have intermittent palpitations on and off for 8 years.  He wore a 30-day monitor that showed multiple arrhythmias including PAT, PVCs, possible atrial flutter as well as nonsustained VT and short runs.  He had an echo that showed a normal EF and a low risk Myoview.  He has had near syncope in the past but no syncope.  He was since put on flecainide and propranolol.    Today, denies symptoms of palpitations, chest pain, shortness of breath, orthopnea, PND, lower extremity edema, claudication, dizziness, presyncope, syncope, bleeding, or neurologic sequela. The patient is tolerating medications without difficulties.  Overall he is feeling well.  He has no chest pain or shortness of breath.  He did have one episode of tachycardia that he felt was due to atrial fibrillation a few months ago.  It lasted most of the day.   Past Medical History:  Diagnosis Date  . Allergy   . Asthma   . GERD (gastroesophageal reflux disease)   . Heart murmur   . Hiatal hernia   . Hyperlipidemia   . Ulcerative colitis 2001   Status post total colectomy with J-pouch in 2003 -    Past Surgical History:  Procedure Laterality Date  . CHOLECYSTECTOMY    . COLECTOMY  2003   with one step take-down to J-pouch  . COLECTOMY    . COLECTOMY  2003   total colectomy with J pouch (one step procedure)  . COLECTOMY  2003   total colectomy with j pouch ( one step)  . TONSILLECTOMY    . TOTAL  COLECTOMY  2003   total colectomy with J pouch      Current Outpatient Medications  Medication Sig Dispense Refill  . aspirin 81 MG tablet Take 81 mg by mouth daily.      . cetirizine (ZYRTEC) 10 MG tablet Take 10 mg by mouth daily.    . flecainide (TAMBOCOR) 50 MG tablet Take 1 tablet (50 mg total) by mouth 2 (two) times daily. 180 tablet 2  . fluticasone (FLONASE) 50 MCG/ACT nasal spray Place 1 spray into both nostrils daily.    Marland Kitchen omeprazole (PRILOSEC) 40 MG capsule Take 1 capsule (40 mg total) by mouth daily. 90 capsule 1  . propranolol (INDERAL) 10 MG tablet Take 1 tablet (10 mg total) by mouth 3 (three) times daily. 270 tablet 2   Current Facility-Administered Medications  Medication Dose Route Frequency Provider Last Rate Last Dose  . 0.9 %  sodium chloride infusion  500 mL Intravenous Once Ladene Artist, MD        Allergies:   Patient has no known allergies.   Social History:  The patient  reports that he has never smoked. He has never used smokeless tobacco. He reports current alcohol use of about 2.0 standard drinks of alcohol per week. He reports that he does not use drugs.   Family History:  The patient's  family history includes Cancer in his mother; Heart attack (age of onset: 47) in his father; Lymphoma (age of onset: 62) in his brother; Stomach cancer (age of onset: 22) in his father.    ROS:  Please see the history of present illness.   Otherwise, review of systems is positive for cough, back pain.   All other systems are reviewed and negative.   PHYSICAL EXAM: VS:  BP 124/82   Pulse (!) 46   Ht 6' 3"  (1.905 m)   Wt 226 lb (102.5 kg)   BMI 28.25 kg/m  , BMI Body mass index is 28.25 kg/m. GEN: Well nourished, well developed, in no acute distress  HEENT: normal  Neck: no JVD, carotid bruits, or masses Cardiac: RRR; no murmurs, rubs, or gallops,no edema  Respiratory:  clear to auscultation bilaterally, normal work of breathing GI: soft, nontender, nondistended,  + BS MS: no deformity or atrophy  Skin: warm and dry Neuro:  Strength and sensation are intact Psych: euthymic mood, full affect  EKG:  EKG is ordered today. Personal review of the ekg ordered shows sinus rhythm, rate 46  Recent Labs: 02/02/2018: ALT 14; BUN 14; Creatinine, Ser 1.10; Hemoglobin 15.2; Magnesium 1.2; Platelets 215.0; Potassium 3.6; Sodium 143    Lipid Panel     Component Value Date/Time   CHOL 173 02/02/2018 1023   TRIG 75.0 02/02/2018 1023   TRIG 74 04/10/2006 0808   HDL 52.90 02/02/2018 1023   CHOLHDL 3 02/02/2018 1023   VLDL 15.0 02/02/2018 1023   LDLCALC 106 (H) 02/02/2018 1023   LDLDIRECT 123.3 03/02/2009 0832     Wt Readings from Last 3 Encounters:  04/09/18 226 lb (102.5 kg)  02/02/18 228 lb (103.4 kg)  09/04/17 223 lb 3.2 oz (101.2 kg)      Other studies Reviewed: Additional studies/ records that were reviewed today include: TTE 03/16/17  Review of the above records today demonstrates:  - Left ventricle: The cavity size was normal. Systolic function was   normal. The estimated ejection fraction was in the range of 60%   to 65%. Wall motion was normal; there were no regional wall   motion abnormalities. - Atrial septum: No defect or patent foramen ovale was identified. - Left atrium:  The atrium was normal in size.  Myoview 05/24/17  There was no ST segment deviation noted during stress. AFIB.  This is a low risk study. No perfusion defects. No ischemia.  ETT 06/15/17 - personally reviewed  Blood pressure demonstrated a normal response to exercise.  Upsloping ST segment depression ST segment depression of 1 mm was noted during stress in the II, III, aVF, V5 and V6 leads.   Positive ETT 1 mm upsloping ST segment depression in inferior lateral leads with stress Normal hemodynamic response  ASSESSMENT AND PLAN:  1.  Nonsustained VT: 5-20 beats on cardiac monitor.  Normal ejection fraction low risk Myoview.  Has calmed down quite a bit on  flecainide.  2.  SVT: No further episodes since starting flecainide.  3.  Paroxysmal atrial fibrillation/atrial flutter: He does say that his heart rate did go up for about a day where he felt weak and fatigued similar to his prior AF episodes.  This was months ago and only happened once.  He is content with his current therapy.  This patients CHA2DS2-VASc Score and unadjusted Ischemic Stroke Rate (% per year) is equal to 0.2 % stroke rate/year from a score of 0  Above score calculated as 1  point each if present [CHF, HTN, DM, Vascular=MI/PAD/Aortic Plaque, Age if 65-74, or Male] Above score calculated as 2 points each if present [Age > 75, or Stroke/TIA/TE]   Current medicines are reviewed at length with the patient today.   The patient does not have concerns regarding his medicines.  The following changes were made today: none  Labs/ tests ordered today include:  Orders Placed This Encounter  Procedures  . EKG 12-Lead    Disposition:   FU with Will Camnitz 12 months  Signed, Will Meredith Leeds, MD  04/09/2018 10:51 AM     [facility] [address] [place] [address] [phone] (office) [phone] (fax)

## 2018-05-05 ENCOUNTER — Other Ambulatory Visit: Payer: Self-pay | Admitting: Cardiology

## 2018-06-07 ENCOUNTER — Other Ambulatory Visit: Payer: Self-pay | Admitting: Cardiology

## 2018-07-18 ENCOUNTER — Encounter: Payer: Self-pay | Admitting: Gastroenterology

## 2018-10-01 ENCOUNTER — Other Ambulatory Visit: Payer: Self-pay | Admitting: *Deleted

## 2018-10-01 MED ORDER — OMEPRAZOLE 40 MG PO CPDR: 40.0000 mg | DELAYED_RELEASE_CAPSULE | Freq: Every day | ORAL | 0 refills | Status: DC

## 2019-01-06 ENCOUNTER — Other Ambulatory Visit: Payer: Self-pay | Admitting: Internal Medicine

## 2019-02-04 ENCOUNTER — Encounter: Payer: 59 | Admitting: Nurse Practitioner

## 2019-03-01 ENCOUNTER — Ambulatory Visit (INDEPENDENT_AMBULATORY_CARE_PROVIDER_SITE_OTHER): Payer: 59 | Admitting: Family

## 2019-03-01 ENCOUNTER — Encounter: Payer: Self-pay | Admitting: Family

## 2019-03-01 ENCOUNTER — Other Ambulatory Visit: Payer: Self-pay

## 2019-03-01 ENCOUNTER — Telehealth: Payer: Self-pay | Admitting: Internal Medicine

## 2019-03-01 DIAGNOSIS — J069 Acute upper respiratory infection, unspecified: Secondary | ICD-10-CM

## 2019-03-01 DIAGNOSIS — Z20822 Contact with and (suspected) exposure to covid-19: Secondary | ICD-10-CM

## 2019-03-01 NOTE — Telephone Encounter (Signed)
Patient is needing to transfer care to another provider. He was a previous patient of [employer]  Would you be willing to see him to establish care?  (He is scheduled today for a virtual acute visit with Mickel Baas for congestion and a sore throat.)

## 2019-03-01 NOTE — Telephone Encounter (Signed)
Joshua Li with me to make appt after laura murray appt to establish if needed

## 2019-03-01 NOTE — Progress Notes (Signed)
Joshua Li is a 58 y.o. male with the following history as recorded in EpicCare:  Patient Active Problem List   Diagnosis Date Noted  . Paroxysmal atrial fibrillation ([place]) 05/04/2017  . Hyperlipidemia   . Hiatal hernia   . GERD (gastroesophageal reflux disease)   . Allergy   . Non-sustained ventricular tachycardia ([facility]) - 5-20 beat runs 02/27/2017  . PAT (paroxysmal atrial tachycardia) ([facility]) - vs. >? Atrial Flutter 02/27/2017  . Heart palpitations 05/13/2016  . Asthma with acute exacerbation 07/04/2015  . Acute bronchitis 07/04/2015  . Acute frontal sinusitis 03/25/2015  . Routine general medical examination at a health care facility 12/23/2014  . Allergic rhinitis 06/25/2010  . Actinic keratosis 06/25/2010  . CONTACT DERMATITIS&OTHER ECZEMA DUE UNSPEC CAUSE 09/07/2009  . COUGH VARIANT ASTHMA 06/10/2009  . COUGH 06/10/2009  . Near syncope 05/19/2009  . PALPITATIONS, OCCASIONAL 05/19/2009  . DERMATITIS, ATOPIC 10/04/2007  . Hyperlipidemia, unspecified 01/22/2007  . ASTHMA 01/22/2007  . GERD 01/22/2007  . 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 GASTROINTESTINAL HEMORRHAGE, HX OF 01/22/2007    Current Outpatient Medications  Medication Sig Dispense Refill  . cetirizine (ZYRTEC) 10 MG tablet Take 10 mg by mouth daily.    . flecainide (TAMBOCOR) 50 MG tablet TAKE 1 TABLET BY MOUTH TWO  TIMES DAILY 180 tablet 3  . omeprazole (PRILOSEC) 40 MG capsule TAKE 1 CAPSULE BY MOUTH  DAILY. (NEED TO ESTABLISH  WITH NEW PROVIDER) 90 capsule 1  . propranolol (INDERAL) 10 MG tablet TAKE 1 TABLET BY MOUTH 3  TIMES DAILY 270 tablet 3  . aspirin 81 MG tablet Take 81 mg by mouth daily.      . fluticasone (FLONASE) 50 MCG/ACT nasal spray Place 1 spray into both nostrils daily.     No current facility-administered medications for this visit.     Allergies: Patient has no known allergies.  Past Medical History:  Diagnosis Date  . Allergy   . Asthma   . GERD  (gastroesophageal reflux disease)   . Heart murmur   . Hiatal hernia   . Hyperlipidemia   . Ulcerative colitis 2001   Status post total colectomy with J-pouch in 2003 -     Past Surgical History:  Procedure Laterality Date  . CHOLECYSTECTOMY    . COLECTOMY  2003   with one step take-down to J-pouch  . COLECTOMY    . COLECTOMY  2003   total colectomy with J pouch (one step procedure)  . COLECTOMY  2003   total colectomy with j pouch ( one step)  . TONSILLECTOMY    . TOTAL COLECTOMY  2003   total colectomy with J pouch     Family History  Problem Relation Age of Onset  . Cancer Mother        breast and ovarian  . Heart attack Father 24  . Stomach cancer Father 30       stomach & bladder -cause of death  . Lymphoma Brother 46       Currently 62  . Colon cancer Neg Hx   . Esophageal cancer Neg Hx   . Pancreatic cancer Neg Hx   . Rectal cancer Neg Hx     Social History   Tobacco Use  . Smoking status: Never Smoker  . Smokeless tobacco: Never Used  Substance Use Topics  . Alcohol use: Yes    Alcohol/week: 2.0 standard drinks    Types: 2 Cans of beer per week  Subjective:   I connected with [name] on 03/01/19 at 11:20 AM EST by a video enabled telemedicine application and verified that I am speaking with the correct person using two identifiers. Provider in office/ patient is at home; provider and patient are only 2 people on video call.     I discussed the limitations of evaluation and management by telemedicine and the availability of in person appointments. The patient expressed understanding and agreed to proceed.  24 hour symptoms of cold/ congestion; low grade fever of 99; no shortness of breath; co-worker sick with similar symptoms; has already had his COVID test done this am; has not started anything for symptom relief.      Objective:  There were no vitals filed for this visit.  General: Well developed, well nourished, in no acute distress  Lungs:  Respirations unlabored;  Neurologic: Alert and oriented; speech intact; face symmetrical;   Assessment:  1. Viral URI     Plan:  COVID test is pending; symptomatic treatment discussed; increase fluids, rest and follow-up worse, no better. He will plan to return for [facility] appointment with Dr. Jenny Reichmann who has agreed to be his new PCP.   No follow-ups on file.  No orders of the defined types were placed in this encounter.   Requested Prescriptions    No prescriptions requested or ordered in this encounter

## 2019-03-01 NOTE — Telephone Encounter (Signed)
Left message for patient to call back to schedule.  °

## 2019-03-03 LAB — NOVEL CORONAVIRUS, NAA: SARS-CoV-2, NAA: NOT DETECTED

## 2019-03-15 ENCOUNTER — Telehealth: Payer: Self-pay | Admitting: Medical

## 2019-03-15 NOTE — Telephone Encounter (Signed)
   Patient called the after hours line to report a delay in his mail ordered flecainide prescription. He will be out of this medication tomorrow. Sent a 30 day supply to [facility] and confirmed with the pharmacist that the prescription can be filled at regular copay price. Patient to pick up prescription tomorrow.   Abigail Butts, PA-C 03/15/19; 6:40 PM

## 2019-03-20 ENCOUNTER — Other Ambulatory Visit: Payer: Self-pay

## 2019-03-20 ENCOUNTER — Ambulatory Visit (INDEPENDENT_AMBULATORY_CARE_PROVIDER_SITE_OTHER): Payer: 59 | Admitting: Internal Medicine

## 2019-03-20 ENCOUNTER — Encounter: Payer: Self-pay | Admitting: Internal Medicine

## 2019-03-20 VITALS — BP 120/72 | HR 97 | Temp 98.1°F | Wt 235.4 lb

## 2019-03-20 DIAGNOSIS — Z0000 Encounter for general adult medical examination without abnormal findings: Secondary | ICD-10-CM | POA: Diagnosis not present

## 2019-03-20 DIAGNOSIS — E559 Vitamin D deficiency, unspecified: Secondary | ICD-10-CM

## 2019-03-20 DIAGNOSIS — E611 Iron deficiency: Secondary | ICD-10-CM | POA: Diagnosis not present

## 2019-03-20 DIAGNOSIS — E538 Deficiency of other specified B group vitamins: Secondary | ICD-10-CM

## 2019-03-20 NOTE — Progress Notes (Signed)
Subjective:    Patient ID: Joshua Li, male    DOB: 03-19-1961, 58 y.o.   MRN: [id number]  HPI   Here for wellness and f/u;  Overall doing ok;  Pt denies Chest pain, worsening SOB, DOE, wheezing, orthopnea, PND, worsening LE edema, palpitations, dizziness or syncope.  Pt denies neurological change such as new headache, facial or extremity weakness.  Pt denies polydipsia, polyuria, or low sugar symptoms. Pt states overall good compliance with treatment and medications, good tolerability, and has been trying to follow appropriate diet.  Pt denies worsening depressive symptoms, suicidal ideation or panic. No fever, night sweats, wt loss, loss of appetite, or other constitutional symptoms.  Pt states good ability with ADL's, has low fall risk, home safety reviewed and adequate, no other significant changes in hearing or vision, and only occasionally active with exercise. No new complaints Past Medical History:  Diagnosis Date  . Allergy   . Asthma   . GERD (gastroesophageal reflux disease)   . Heart murmur   . Hiatal hernia   . Hyperlipidemia   . Ulcerative colitis 2001   Status post total colectomy with J-pouch in 2003 -    Past Surgical History:  Procedure Laterality Date  . CHOLECYSTECTOMY    . COLECTOMY  2003   with one step take-down to J-pouch  . COLECTOMY    . COLECTOMY  2003   total colectomy with J pouch (one step procedure)  . COLECTOMY  2003   total colectomy with j pouch ( one step)  . TONSILLECTOMY    . TOTAL COLECTOMY  2003   total colectomy with J pouch     reports that he has never smoked. He has never used smokeless tobacco. He reports current alcohol use of about 2.0 standard drinks of alcohol per week. He reports that he does not use drugs. family history includes Cancer in his mother; Heart attack (age of onset: 69) in his father; Lymphoma (age of onset: 46) in his brother; Stomach cancer (age of onset: 73) in his father. No Known Allergies Current Outpatient  Medications on File Prior to Visit  Medication Sig Dispense Refill  . aspirin 81 MG tablet Take 81 mg by mouth daily.      . cetirizine (ZYRTEC) 10 MG tablet Take 10 mg by mouth daily.    . flecainide (TAMBOCOR) 50 MG tablet TAKE 1 TABLET BY MOUTH TWO  TIMES DAILY 180 tablet 3  . fluticasone (FLONASE) 50 MCG/ACT nasal spray Place 1 spray into both nostrils daily.    Marland Kitchen omeprazole (PRILOSEC) 40 MG capsule TAKE 1 CAPSULE BY MOUTH  DAILY. (NEED TO ESTABLISH  WITH NEW PROVIDER) 90 capsule 1  . propranolol (INDERAL) 10 MG tablet TAKE 1 TABLET BY MOUTH 3  TIMES DAILY 270 tablet 3   No current facility-administered medications on file prior to visit.   Review of Systems  Constitutional: Negative for other unusual diaphoresis or sweats HENT: Negative for ear discharge or swelling Eyes: Negative for other worsening visual disturbances Respiratory: Negative for stridor or other swelling  Gastrointestinal: Negative for worsening distension or other blood Genitourinary: Negative for retention or other urinary change Musculoskeletal: Negative for other MSK pain or swelling Skin: Negative for color change or other new lesions Neurological: Negative for worsening tremors and other numbness  Psychiatric/Behavioral: Negative for worsening agitation or other fatigue All otherwise neg per pt     Objective:   Physical Exam BP 120/72 (BP Location: Left Arm)   Pulse  97   Temp 98.1 F (36.7 C) (Oral)   Wt 235 lb 6.4 oz (106.8 kg)   SpO2 97%   BMI 29.42 kg/m  VS noted,  Constitutional: Pt is oriented to person, place, and time. Appears well-developed and well-nourished, in no significant distress and comfortable Head: Normocephalic and atraumatic  Eyes: Conjunctivae and EOM are normal. Pupils are equal, round, and reactive to light Right Ear: External ear normal without discharge Left Ear: External ear normal without discharge Nose: Nose without discharge or deformity Mouth/Throat: Oropharynx is  without other ulcerations and moist  Neck: Normal range of motion. Neck supple. No JVD present. No tracheal deviation present or significant neck LA or mass Cardiovascular: Normal rate, regular rhythm, normal heart sounds and intact distal pulses.   Pulmonary/Chest: WOB normal and breath sounds without rales or wheezing  Abdominal: Soft. Bowel sounds are normal. NT. No HSM  Musculoskeletal: Normal range of motion. Exhibits no edema Lymphadenopathy: Has no other cervical adenopathy.  Neurological: Pt is alert and oriented to person, place, and time. Pt has normal reflexes. No cranial nerve deficit. Motor grossly intact, Gait intact Skin: Skin is warm and dry. No rash noted or new ulcerations Psychiatric:  Has normal mood and affect. Behavior is normal without agitation All otherwise neg per pt Lab Results  Component Value Date   WBC 7.7 03/20/2019   HGB 15.8 03/20/2019   HCT 46.2 03/20/2019   PLT 219.0 03/20/2019   GLUCOSE 90 03/20/2019   CHOL 193 03/20/2019   TRIG 137.0 03/20/2019   HDL 48.60 03/20/2019   LDLDIRECT 123.3 03/02/2009   LDLCALC 117 (H) 03/20/2019   ALT 18 03/20/2019   AST 15 03/20/2019   NA 138 03/20/2019   K 3.9 03/20/2019   CL 105 03/20/2019   CREATININE 1.34 03/20/2019   BUN 17 03/20/2019   CO2 27 03/20/2019   TSH 1.86 03/20/2019   PSA 0.69 03/20/2019       Assessment & Plan:

## 2019-03-20 NOTE — Patient Instructions (Signed)
Please continue all other medications as before, and refills have been done if requested.  Please have the pharmacy call with any other refills you may need.  Please continue your efforts at being more active, low cholesterol diet, and weight control.  You are otherwise up to date with prevention measures today.  Please keep your appointments with your specialists as you may have planned  Please return in 1 year for your yearly visit, or sooner if needed

## 2019-03-21 LAB — VITAMIN B12: Vitamin B-12: 327 pg/mL (ref 211–911)

## 2019-03-21 LAB — VITAMIN D 25 HYDROXY (VIT D DEFICIENCY, FRACTURES): VITD: 19.28 ng/mL — ABNORMAL LOW (ref 30.00–100.00)

## 2019-03-21 LAB — IBC PANEL
Iron: 99 ug/dL (ref 42–165)
Saturation Ratios: 33.5 % (ref 20.0–50.0)
Transferrin: 211 mg/dL — ABNORMAL LOW (ref 212.0–360.0)

## 2019-03-21 LAB — HEPATIC FUNCTION PANEL
ALT: 18 U/L (ref 0–53)
AST: 15 U/L (ref 0–37)
Albumin: 4.3 g/dL (ref 3.5–5.2)
Alkaline Phosphatase: 49 U/L (ref 39–117)
Bilirubin, Direct: 0.1 mg/dL (ref 0.0–0.3)
Total Bilirubin: 0.6 mg/dL (ref 0.2–1.2)
Total Protein: 7.2 g/dL (ref 6.0–8.3)

## 2019-03-21 LAB — URINALYSIS, ROUTINE W REFLEX MICROSCOPIC
Bilirubin Urine: NEGATIVE
Hgb urine dipstick: NEGATIVE
Ketones, ur: NEGATIVE
Leukocytes,Ua: NEGATIVE
Nitrite: NEGATIVE
RBC / HPF: NONE SEEN (ref 0–?)
Specific Gravity, Urine: 1.025 (ref 1.000–1.030)
Total Protein, Urine: NEGATIVE
Urine Glucose: NEGATIVE
Urobilinogen, UA: 0.2 (ref 0.0–1.0)
pH: 6 (ref 5.0–8.0)

## 2019-03-21 LAB — CBC WITH DIFFERENTIAL/PLATELET
Basophils Absolute: 0.1 10*3/uL (ref 0.0–0.1)
Basophils Relative: 1 % (ref 0.0–3.0)
Eosinophils Absolute: 0.2 10*3/uL (ref 0.0–0.7)
Eosinophils Relative: 3.1 % (ref 0.0–5.0)
HCT: 46.2 % (ref 39.0–52.0)
Hemoglobin: 15.8 g/dL (ref 13.0–17.0)
Lymphocytes Relative: 43.1 % (ref 12.0–46.0)
Lymphs Abs: 3.3 10*3/uL (ref 0.7–4.0)
MCHC: 34.1 g/dL (ref 30.0–36.0)
MCV: 90.4 fl (ref 78.0–100.0)
Monocytes Absolute: 0.7 10*3/uL (ref 0.1–1.0)
Monocytes Relative: 8.8 % (ref 3.0–12.0)
Neutro Abs: 3.4 10*3/uL (ref 1.4–7.7)
Neutrophils Relative %: 44 % (ref 43.0–77.0)
Platelets: 219 10*3/uL (ref 150.0–400.0)
RBC: 5.1 Mil/uL (ref 4.22–5.81)
RDW: 13.4 % (ref 11.5–15.5)
WBC: 7.7 10*3/uL (ref 4.0–10.5)

## 2019-03-21 LAB — LIPID PANEL
Cholesterol: 193 mg/dL (ref 0–200)
HDL: 48.6 mg/dL (ref >39.00)
LDL Cholesterol: 117 mg/dL — ABNORMAL HIGH (ref 0–99)
NonHDL: 143.99
Total CHOL/HDL Ratio: 4
Triglycerides: 137 mg/dL (ref 0.0–149.0)
VLDL: 27.4 mg/dL (ref 0.0–40.0)

## 2019-03-21 LAB — BASIC METABOLIC PANEL
BUN: 17 mg/dL (ref 6–23)
CO2: 27 mEq/L (ref 19–32)
Calcium: 9.3 mg/dL (ref 8.4–10.5)
Chloride: 105 mEq/L (ref 96–112)
Creatinine, Ser: 1.34 mg/dL (ref 0.40–1.50)
GFR: 54.33 mL/min — ABNORMAL LOW (ref >60.00)
Glucose, Bld: 90 mg/dL (ref 70–99)
Potassium: 3.9 mEq/L (ref 3.5–5.1)
Sodium: 138 mEq/L (ref 135–145)

## 2019-03-21 LAB — PSA: PSA: 0.69 ng/mL (ref 0.10–4.00)

## 2019-03-21 LAB — TSH: TSH: 1.86 u[IU]/mL (ref 0.35–4.50)

## 2019-03-24 ENCOUNTER — Other Ambulatory Visit: Payer: Self-pay | Admitting: Internal Medicine

## 2019-03-24 MED ORDER — VITAMIN D (ERGOCALCIFEROL) 1.25 MG (50000 UNIT) PO CAPS: 50000.0000 [IU] | ORAL_CAPSULE | ORAL | 0 refills | Status: DC

## 2019-03-29 ENCOUNTER — Encounter: Payer: Self-pay | Admitting: Internal Medicine

## 2019-03-29 NOTE — Assessment & Plan Note (Signed)

## 2019-05-14 ENCOUNTER — Other Ambulatory Visit: Payer: Self-pay | Admitting: Cardiology

## 2019-06-03 ENCOUNTER — Other Ambulatory Visit: Payer: Self-pay | Admitting: Internal Medicine

## 2019-06-03 MED ORDER — OMEPRAZOLE 40 MG PO CPDR: 40.0000 mg | DELAYED_RELEASE_CAPSULE | Freq: Every day | ORAL | 1 refills | Status: DC

## 2019-06-03 NOTE — Telephone Encounter (Signed)
Erx sent to Mail order per documented request.

## 2019-06-03 NOTE — Telephone Encounter (Signed)
    1.Medication Requested: omeprazole (PRILOSEC) 40 MG capsule  2. Pharmacy (Name, [address], [place]): [facility]  3. On Med List: yes  4. Last Visit with PCP: 03/20/19  5. Next visit date with PCP: n/a   Agent: Please be advised that RX refills may take up to 3 business days. We ask that you follow-up with your pharmacy.

## 2019-06-07 DIAGNOSIS — Z23 Encounter for immunization: Secondary | ICD-10-CM | POA: Diagnosis not present

## 2019-06-18 ENCOUNTER — Encounter: Payer: Self-pay | Admitting: Cardiology

## 2019-06-18 ENCOUNTER — Ambulatory Visit (INDEPENDENT_AMBULATORY_CARE_PROVIDER_SITE_OTHER): Payer: BC Managed Care – PPO | Admitting: Cardiology

## 2019-06-18 ENCOUNTER — Other Ambulatory Visit: Payer: Self-pay

## 2019-06-18 VITALS — BP 102/60 | HR 47 | Ht 75.0 in | Wt 237.6 lb

## 2019-06-18 DIAGNOSIS — I480 Paroxysmal atrial fibrillation: Secondary | ICD-10-CM

## 2019-06-18 NOTE — Progress Notes (Signed)
Electrophysiology Office Note   Date:  06/18/2019   ID:  Joshua Li, DOB 1960/11/30, MRN [id number]  PCP:  Biagio Borg, MD  Cardiologist:  Ellyn Hack Primary Electrophysiologist:  Catheryne Deford Meredith Leeds, MD    No chief complaint on file.    History of Present Illness: Joshua Li is a 60 y.o. male who is being seen today for the evaluation of palpitations at the request of Joshua Li. Presenting today for electrophysiology evaluation.  He has a history of  hyperlipidemia.  He was seen October 2018 and was noted to have intermittent palpitations on and off for 8 years.  He wore a 30-day monitor that showed multiple arrhythmias including PAT, PVCs, possible atrial flutter as well as nonsustained VT and short runs.  He had an echo that showed a normal EF and a low risk Myoview.  He has had near syncope in the past but no syncope.  He was since put on flecainide and propranolol.    Today, denies symptoms of palpitations, chest pain, shortness of breath, orthopnea, PND, lower extremity edema, claudication, dizziness, presyncope, syncope, bleeding, or neurologic sequela. The patient is tolerating medications without difficulties.  Overall he is feeling well.  He notes minimal episodes of atrial fibrillation.  He says that he only notes issues when he checks his watch and is asymptomatic.  He has received his first dose of the [place] vaccine with the second dose April 9.   Past Medical History:  Diagnosis Date  . Allergy   . Asthma   . GERD (gastroesophageal reflux disease)   . Heart murmur   . Hiatal hernia   . Hyperlipidemia   . Ulcerative colitis 2001   Status post total colectomy with J-pouch in 2003 -    Past Surgical History:  Procedure Laterality Date  . CHOLECYSTECTOMY    . COLECTOMY  2003   with one step take-down to J-pouch  . COLECTOMY    . COLECTOMY  2003   total colectomy with J pouch (one step procedure)  . COLECTOMY  2003   total colectomy with j pouch ( one  step)  . TONSILLECTOMY    . TOTAL COLECTOMY  2003   total colectomy with J pouch      Current Outpatient Medications  Medication Sig Dispense Refill  . aspirin 81 MG tablet Take 81 mg by mouth daily.      . cetirizine (ZYRTEC) 10 MG tablet Take 10 mg by mouth daily.    . flecainide (TAMBOCOR) 50 MG tablet Take 1 tablet (50 mg total) by mouth 2 (two) times daily. Please make overdue appt with Dr. Curt Bears before anymore refills. 1st attempt 60 tablet 0  . fluticasone (FLONASE) 50 MCG/ACT nasal spray Place 1 spray into both nostrils daily.    Marland Kitchen omeprazole (PRILOSEC) 40 MG capsule Take 1 capsule (40 mg total) by mouth daily. 90 capsule 1  . propranolol (INDERAL) 10 MG tablet TAKE 1 TABLET BY MOUTH 3  TIMES DAILY 270 tablet 3  . Vitamin D, Ergocalciferol, (DRISDOL) 1.25 MG (50000 UT) CAPS capsule Take 1 capsule (50,000 Units total) by mouth every 7 (seven) days. 12 capsule 0   No current facility-administered medications for this visit.    Allergies:   Patient has no known allergies.   Social History:  The patient  reports that he has never smoked. He has never used smokeless tobacco. He reports current alcohol use of about 2.0 standard drinks of alcohol per week. He reports  that he does not use drugs.   Family History:  The patient's family history includes Cancer in his mother; Heart attack (age of onset: 47) in his father; Lymphoma (age of onset: 54) in his brother; Stomach cancer (age of onset: 4) in his father.    ROS:  Please see the history of present illness.   Otherwise, review of systems is positive for none.   All other systems are reviewed and negative.   PHYSICAL EXAM: VS:  BP 102/60   Pulse (!) 47   Ht 6' 3"  (1.905 m)   Wt 237 lb 9.6 oz (107.8 kg)   SpO2 96%   BMI 29.70 kg/m  , BMI Body mass index is 29.7 kg/m. GEN: Well nourished, well developed, in no acute distress  HEENT: normal  Neck: no JVD, carotid bruits, or masses Cardiac: RRR; no murmurs, rubs, or  gallops,no edema  Respiratory:  clear to auscultation bilaterally, normal work of breathing GI: soft, nontender, nondistended, + BS MS: no deformity or atrophy  Skin: warm and dry Neuro:  Strength and sensation are intact Psych: euthymic mood, full affect  EKG:  EKG is ordered today. Personal review of the ekg ordered shows sinus rhythm, rate 47  Recent Labs: 03/20/2019: ALT 18; BUN 17; Creatinine, Ser 1.34; Hemoglobin 15.8; Platelets 219.0; Potassium 3.9; Sodium 138; TSH 1.86    Lipid Panel     Component Value Date/Time   CHOL 193 03/20/2019 1652   TRIG 137.0 03/20/2019 1652   TRIG 74 04/10/2006 0808   HDL 48.60 03/20/2019 1652   CHOLHDL 4 03/20/2019 1652   VLDL 27.4 03/20/2019 1652   LDLCALC 117 (H) 03/20/2019 1652   LDLDIRECT 123.3 03/02/2009 0832     Wt Readings from Last 3 Encounters:  06/18/19 237 lb 9.6 oz (107.8 kg)  03/20/19 235 lb 6.4 oz (106.8 kg)  04/09/18 226 lb (102.5 kg)      Other studies Reviewed: Additional studies/ records that were reviewed today include: TTE 03/16/17  Review of the above records today demonstrates:  - Left ventricle: The cavity size was normal. Systolic function was   normal. The estimated ejection fraction was in the range of 60%   to 65%. Wall motion was normal; there were no regional wall   motion abnormalities. - Atrial septum: No defect or patent foramen ovale was identified. - Left atrium:  The atrium was normal in size.  Myoview 05/24/17  There was no ST segment deviation noted during stress. AFIB.  This is a low risk study. No perfusion defects. No ischemia.  ETT 06/15/17 - personally reviewed  Blood pressure demonstrated a normal response to exercise.  Upsloping ST segment depression ST segment depression of 1 mm was noted during stress in the II, III, aVF, V5 and V6 leads.   Positive ETT 1 mm upsloping ST segment depression in inferior lateral leads with stress Normal hemodynamic response  ASSESSMENT AND  PLAN:  1.  Nonsustained VT: Had 5-20 beats on cardiac monitoring.  Ejection fraction was normal with a low risk Myoview.  No further episodes on flecainide.    2.  SVT: No further episodes since starting flecainide  3.  Paroxysmal atrial fibrillation/atrial flutter: He on flecainide and propranolol.  CHA2DS2-VASc of 0.  Mains in sinus rhythm with only minimal episodes of atrial fibrillation.  He is happy with his overall control.  No changes.  Current medicines are reviewed at length with the patient today.   The patient does not have concerns regarding  his medicines.  The following changes were made today: None  Labs/ tests ordered today include:  Orders Placed This Encounter  Procedures  . EKG 12-Lead    Disposition:   FU with Jaki Hammerschmidt 12 months  Signed, Leilan Bochenek Meredith Leeds, MD  06/18/2019 9:18 AM     [facility] [address] [place] [address] [phone] (office) [phone] (fax)

## 2019-07-05 DIAGNOSIS — Z23 Encounter for immunization: Secondary | ICD-10-CM | POA: Diagnosis not present

## 2019-07-15 ENCOUNTER — Other Ambulatory Visit: Payer: Self-pay

## 2019-07-15 MED ORDER — PROPRANOLOL HCL 10 MG PO TABS: 10.0000 mg | ORAL_TABLET | Freq: Three times a day (TID) | ORAL | 3 refills | Status: DC

## 2019-07-15 MED ORDER — FLECAINIDE ACETATE 50 MG PO TABS: 50.0000 mg | ORAL_TABLET | Freq: Two times a day (BID) | ORAL | 3 refills | Status: DC

## 2019-07-15 NOTE — Telephone Encounter (Signed)
Pt's medications were sent to pt's pharmacy a requested. Confirmation received.

## 2020-03-23 ENCOUNTER — Encounter: Payer: 59 | Admitting: Internal Medicine

## 2020-04-01 ENCOUNTER — Encounter: Payer: Self-pay | Admitting: Internal Medicine

## 2020-04-01 ENCOUNTER — Other Ambulatory Visit: Payer: Self-pay

## 2020-04-01 ENCOUNTER — Ambulatory Visit (INDEPENDENT_AMBULATORY_CARE_PROVIDER_SITE_OTHER): Payer: 59 | Admitting: Internal Medicine

## 2020-04-01 VITALS — BP 120/86 | HR 43 | Temp 97.7°F | Ht 75.0 in | Wt 232.0 lb

## 2020-04-01 DIAGNOSIS — Z0000 Encounter for general adult medical examination without abnormal findings: Secondary | ICD-10-CM | POA: Diagnosis not present

## 2020-04-01 DIAGNOSIS — R030 Elevated blood-pressure reading, without diagnosis of hypertension: Secondary | ICD-10-CM

## 2020-04-01 DIAGNOSIS — R739 Hyperglycemia, unspecified: Secondary | ICD-10-CM | POA: Insufficient documentation

## 2020-04-01 DIAGNOSIS — E559 Vitamin D deficiency, unspecified: Secondary | ICD-10-CM | POA: Insufficient documentation

## 2020-04-01 DIAGNOSIS — E7849 Other hyperlipidemia: Secondary | ICD-10-CM

## 2020-04-01 DIAGNOSIS — Z0001 Encounter for general adult medical examination with abnormal findings: Secondary | ICD-10-CM

## 2020-04-01 DIAGNOSIS — N289 Disorder of kidney and ureter, unspecified: Secondary | ICD-10-CM

## 2020-04-01 LAB — LIPID PANEL
Cholesterol: 197 mg/dL (ref 0–200)
HDL: 55.4 mg/dL (ref >39.00)
LDL Cholesterol: 127 mg/dL — ABNORMAL HIGH (ref 0–99)
NonHDL: 141.51
Total CHOL/HDL Ratio: 4
Triglycerides: 75 mg/dL (ref 0.0–149.0)
VLDL: 15 mg/dL (ref 0.0–40.0)

## 2020-04-01 LAB — CBC WITH DIFFERENTIAL/PLATELET
Basophils Absolute: 0 10*3/uL (ref 0.0–0.1)
Basophils Relative: 0.5 % (ref 0.0–3.0)
Eosinophils Absolute: 0.2 10*3/uL (ref 0.0–0.7)
Eosinophils Relative: 2.1 % (ref 0.0–5.0)
HCT: 46 % (ref 39.0–52.0)
Hemoglobin: 16.2 g/dL (ref 13.0–17.0)
Lymphocytes Relative: 39.7 % (ref 12.0–46.0)
Lymphs Abs: 3.3 10*3/uL (ref 0.7–4.0)
MCHC: 35.3 g/dL (ref 30.0–36.0)
MCV: 86.9 fl (ref 78.0–100.0)
Monocytes Absolute: 0.5 10*3/uL (ref 0.1–1.0)
Monocytes Relative: 6.3 % (ref 3.0–12.0)
Neutro Abs: 4.2 10*3/uL (ref 1.4–7.7)
Neutrophils Relative %: 51.4 % (ref 43.0–77.0)
Platelets: 216 10*3/uL (ref 150.0–400.0)
RBC: 5.29 Mil/uL (ref 4.22–5.81)
RDW: 13.4 % (ref 11.5–15.5)
WBC: 8.3 10*3/uL (ref 4.0–10.5)

## 2020-04-01 LAB — URINALYSIS, ROUTINE W REFLEX MICROSCOPIC
Bilirubin Urine: NEGATIVE
Ketones, ur: NEGATIVE
Leukocytes,Ua: NEGATIVE
Nitrite: NEGATIVE
Specific Gravity, Urine: >=1.03 — AB (ref 1.000–1.030)
Total Protein, Urine: NEGATIVE
Urine Glucose: NEGATIVE
Urobilinogen, UA: 0.2 (ref 0.0–1.0)
pH: 6 (ref 5.0–8.0)

## 2020-04-01 LAB — HEMOGLOBIN A1C: Hgb A1c MFr Bld: 5.4 % (ref 4.6–6.5)

## 2020-04-01 LAB — BASIC METABOLIC PANEL
BUN: 16 mg/dL (ref 6–23)
CO2: 29 mEq/L (ref 19–32)
Calcium: 9.1 mg/dL (ref 8.4–10.5)
Chloride: 105 mEq/L (ref 96–112)
Creatinine, Ser: 1.22 mg/dL (ref 0.40–1.50)
GFR: 64.09 mL/min (ref >60.00)
Glucose, Bld: 83 mg/dL (ref 70–99)
Potassium: 3.6 mEq/L (ref 3.5–5.1)
Sodium: 140 mEq/L (ref 135–145)

## 2020-04-01 LAB — HEPATIC FUNCTION PANEL
ALT: 31 U/L (ref 0–53)
AST: 18 U/L (ref 0–37)
Albumin: 4.5 g/dL (ref 3.5–5.2)
Alkaline Phosphatase: 52 U/L (ref 39–117)
Bilirubin, Direct: 0.2 mg/dL (ref 0.0–0.3)
Total Bilirubin: 0.9 mg/dL (ref 0.2–1.2)
Total Protein: 7.3 g/dL (ref 6.0–8.3)

## 2020-04-01 LAB — TSH: TSH: 1.99 u[IU]/mL (ref 0.35–4.50)

## 2020-04-01 LAB — VITAMIN D 25 HYDROXY (VIT D DEFICIENCY, FRACTURES): VITD: 45.96 ng/mL (ref 30.00–100.00)

## 2020-04-01 LAB — PSA: PSA: 0.83 ng/mL (ref 0.10–4.00)

## 2020-04-01 MED ORDER — OMEPRAZOLE 40 MG PO CPDR: 40.0000 mg | DELAYED_RELEASE_CAPSULE | Freq: Every day | ORAL | 3 refills | Status: DC

## 2020-04-01 MED ORDER — ROSUVASTATIN CALCIUM 20 MG PO TABS: 20.0000 mg | ORAL_TABLET | Freq: Every day | ORAL | 3 refills | Status: DC

## 2020-04-01 NOTE — Progress Notes (Addendum)
Established Patient Office Visit  Subjective:  Patient ID: Joshua Li, male    DOB: [birth year]  Age: 60 y.o. MRN: [id number]       Chief Complaint: ( wellness and low vit D, hld, renal insufficnecy, and elevated BP       HPI:  Joshua Li is a 60 y.o. male here for wellness overall doing well, without specific complaints  Pt denies chest pain, increased sob or doe, wheezing, orthopnea, PND, increased LE swelling, palpitations, dizziness or syncope.  Pt denies new neurological symptoms such as new headache, or facial or extremity weakness or numbness   Pt denies polydipsia, polyuria,  Pt denies fever, wt loss, night sweats, loss of appetite, or other constitutional symptoms  Wt Readings from Last 3 Encounters:  04/01/20 232 lb (105.2 kg)  06/18/19 237 lb 9.6 oz (107.8 kg)  03/20/19 235 lb 6.4 oz (106.8 kg)   BP Readings from Last 3 Encounters:  04/01/20 120/86  06/18/19 102/60  03/20/19 120/72       Past Medical History:  Diagnosis Date  . Allergy   . Asthma   . GERD (gastroesophageal reflux disease)   . Heart murmur   . Hiatal hernia   . Hyperlipidemia   . Ulcerative colitis 2001   Status post total colectomy with J-pouch in 2003 -    Past Surgical History:  Procedure Laterality Date  . CHOLECYSTECTOMY    . COLECTOMY  2003   with one step take-down to J-pouch  . COLECTOMY    . COLECTOMY  2003   total colectomy with J pouch (one step procedure)  . COLECTOMY  2003   total colectomy with j pouch ( one step)  . TONSILLECTOMY    . TOTAL COLECTOMY  2003   total colectomy with J pouch     reports that he has never smoked. He has never used smokeless tobacco. He reports current alcohol use of about 2.0 standard drinks of alcohol per week. He reports that he does not use drugs. family history includes Cancer in his mother; Heart attack (age of onset: 73) in his father; Lymphoma (age of onset: 33) in his brother; Stomach cancer (age of onset: 71) in his father. No Known  Allergies Current Outpatient Medications on File Prior to Visit  Medication Sig Dispense Refill  . aspirin 81 MG tablet Take 81 mg by mouth daily.    . flecainide (TAMBOCOR) 50 MG tablet Take 1 tablet (50 mg total) by mouth 2 (two) times daily. 180 tablet 3  . propranolol (INDERAL) 10 MG tablet Take 1 tablet (10 mg total) by mouth 3 (three) times daily. 270 tablet 3   No current facility-administered medications on file prior to visit.        ROS:  All others reviewed and negative.  Objective        PE:  BP 120/86 (BP Location: Left Arm, Patient Position: Sitting, Cuff Size: Normal)   Pulse (!) 43   Temp 97.7 F (36.5 C) (Oral)   Ht 6' 3"  (1.905 m)   Wt 232 lb (105.2 kg)   SpO2 98%   BMI 29.00 kg/m                 Constitutional: Pt appears in NAD               HENT: Head: NCAT.                Right Ear: External ear normal.  Left Ear: External ear normal.                Eyes: . Pupils are equal, round, and reactive to light. Conjunctivae and EOM are normal               Nose: without d/c or deformity               Neck: Neck supple. Gross normal ROM               Cardiovascular: Normal rate and regular rhythm.                 Pulmonary/Chest: Effort normal and breath sounds without rales or wheezing.                Abd:  Soft, NT, ND, + BS, no organomegaly               Neurological: Pt is alert. At baseline orientation, motor grossly intact               Skin: Skin is warm. No rashes, no other new lesions, LE edema - none               Psychiatric: Pt behavior is normal without agitation   Assessment/Plan:  Joshua Li is a 60 y.o. White or Caucasian [1] male with  has a past medical history of Allergy, Asthma, GERD (gastroesophageal reflux disease), Heart murmur, Hiatal hernia, Hyperlipidemia, and Ulcerative colitis (2001).  Assessment Plan  See notes Labs reviewed for each problem: Lab Results  Component Value Date   WBC 8.3 04/01/2020   HGB 16.2  04/01/2020   HCT 46.0 04/01/2020   PLT 216.0 04/01/2020   GLUCOSE 83 04/01/2020   CHOL 197 04/01/2020   TRIG 75.0 04/01/2020   HDL 55.40 04/01/2020   LDLDIRECT 123.3 03/02/2009   LDLCALC 127 (H) 04/01/2020   ALT 31 04/01/2020   AST 18 04/01/2020   NA 140 04/01/2020   K 3.6 04/01/2020   CL 105 04/01/2020   CREATININE 1.22 04/01/2020   BUN 16 04/01/2020   CO2 29 04/01/2020   TSH 1.99 04/01/2020   PSA 0.83 04/01/2020   HGBA1C 5.4 04/01/2020    Micro: none  Cardiac tracings I have personally interpreted today:  none  Pertinent Radiological findings (summarize): none    There are no preventive care reminders to display for this patient.  Lab Results  Component Value Date   TSH 1.99 04/01/2020   Lab Results  Component Value Date   WBC 8.3 04/01/2020   HGB 16.2 04/01/2020   HCT 46.0 04/01/2020   MCV 86.9 04/01/2020   PLT 216.0 04/01/2020   Lab Results  Component Value Date   NA 140 04/01/2020   K 3.6 04/01/2020   CO2 29 04/01/2020   GLUCOSE 83 04/01/2020   BUN 16 04/01/2020   CREATININE 1.22 04/01/2020   BILITOT 0.9 04/01/2020   ALKPHOS 52 04/01/2020   AST 18 04/01/2020   ALT 31 04/01/2020   PROT 7.3 04/01/2020   ALBUMIN 4.5 04/01/2020   CALCIUM 9.1 04/01/2020   GFR 64.09 04/01/2020   Lab Results  Component Value Date   CHOL 197 04/01/2020   Lab Results  Component Value Date   HDL 55.40 04/01/2020   Lab Results  Component Value Date   LDLCALC 127 (H) 04/01/2020   Lab Results  Component Value Date   TRIG 75.0 04/01/2020   Lab Results  Component Value Date  CHOLHDL 4 04/01/2020   Lab Results  Component Value Date   HGBA1C 5.4 04/01/2020      Assessment & Plan:   Problem List Items Addressed This Visit      High   Encounter for well adult exam with abnormal findings - Primary    Overall doing well, age appropriate education and counseling updated, referrals for preventative services and immunizations addressed, dietary and smoking  counseling addressed, most recent labs reviewed.  I have personally reviewed and have noted:  1) the patient's medical and social history 2) The pt's use of alcohol, tobacco, and illicit drugs 3) The patient's current medications and supplements 4) Functional ability including ADL's, fall risk, home safety risk, hearing and visual impairment 5) Diet and physical activities 6) Evidence for depression or mood disorder 7) The patient's height, weight, and BMI have been recorded in the chart  I have made referrals, and provided counseling and education based on review of the above       Relevant Orders   PSA (Completed)     Medium   Vitamin D deficiency    Last vitamin D Lab Results  Component Value Date   VD25OH 45.96 04/01/2020   Stable, cont oral replacement      Relevant Orders   VITAMIN D 25 Hydroxy (Vit-D Deficiency, Fractures) (Completed)   Renal insufficiency    ? ckd 3 - for f/u lab      Hyperlipidemia    Lab Results  Component Value Date   LDLCALC 127 (H) 04/01/2020   Stable, pt to continue current statin crestor 20   Current Outpatient Medications (Cardiovascular):  .  flecainide (TAMBOCOR) 50 MG tablet, Take 1 tablet (50 mg total) by mouth 2 (two) times daily. .  propranolol (INDERAL) 10 MG tablet, Take 1 tablet (10 mg total) by mouth 3 (three) times daily. .  rosuvastatin (CRESTOR) 20 MG tablet, Take 1 tablet (20 mg total) by mouth daily.   Current Outpatient Medications (Analgesics):  .  aspirin 81 MG tablet, Take 81 mg by mouth daily.   Current Outpatient Medications (Other):  .  omeprazole (PRILOSEC) 40 MG capsule, Take 1 capsule (40 mg total) by mouth daily.      Relevant Medications   rosuvastatin (CRESTOR) 20 MG tablet   Other Relevant Orders   Lipid panel (Completed)   Hepatic function panel (Completed)   CBC with Differential/Platelet (Completed)   TSH (Completed)   Urinalysis, Routine w reflex microscopic (Completed)   Basic metabolic  panel (Completed)   Hyperglycemia    Lab Results  Component Value Date   HGBA1C 5.4 04/01/2020   Stable, pt to continue current medical treatment  - diet       Relevant Orders   Hemoglobin A1c (Completed)   Blood pressure elevated without history of HTN    Mild, for f/u BP at home and next visit, to call for ave BP athome > 140/90         Meds ordered this encounter  Medications  . omeprazole (PRILOSEC) 40 MG capsule    Sig: Take 1 capsule (40 mg total) by mouth daily.    Dispense:  90 capsule    Refill:  3    Requesting 1 year supply  . rosuvastatin (CRESTOR) 20 MG tablet    Sig: Take 1 tablet (20 mg total) by mouth daily.    Dispense:  90 tablet    Refill:  3    Follow-up: Return in about 1 year (around  04/01/2021).   Cathlean Cower, MD 04/01/2020 1:20 PM [facility]

## 2020-04-01 NOTE — Patient Instructions (Signed)
Please continue to take OTC Vitamin D3 at 2000 units per day, indefinitely.  Please take all new medication as prescribed  - the crestor  Please continue all other medications as before, and refills have been done if requested - omeprazole  Please have the pharmacy call with any other refills you may need.  Please continue your efforts at being more active, low cholesterol diet, and weight control.  You are otherwise up to date with prevention measures today.  Please keep your appointments with your specialists as you may have planned  Please go to the LAB at the blood drawing area for the tests to be done  You will be contacted by phone if any changes need to be made immediately.  Otherwise, you will receive a letter about your results with an explanation, but please check with MyChart first.  Please remember to sign up for MyChart if you have not done so, as this will be important to you in the future with finding out test results, communicating by private email, and scheduling acute appointments online when needed.  Please make an Appointment to return for your 1 year visit, or sooner if needed

## 2020-04-04 ENCOUNTER — Other Ambulatory Visit: Payer: 59

## 2020-04-04 ENCOUNTER — Other Ambulatory Visit: Payer: Self-pay

## 2020-04-04 DIAGNOSIS — Z20822 Contact with and (suspected) exposure to covid-19: Secondary | ICD-10-CM

## 2020-04-07 LAB — NOVEL CORONAVIRUS, NAA: SARS-CoV-2, NAA: NOT DETECTED

## 2020-04-13 ENCOUNTER — Encounter: Payer: Self-pay | Admitting: Internal Medicine

## 2020-04-13 DIAGNOSIS — R030 Elevated blood-pressure reading, without diagnosis of hypertension: Secondary | ICD-10-CM | POA: Insufficient documentation

## 2020-04-13 DIAGNOSIS — N289 Disorder of kidney and ureter, unspecified: Secondary | ICD-10-CM | POA: Insufficient documentation

## 2020-04-13 NOTE — Assessment & Plan Note (Signed)

## 2020-04-13 NOTE — Assessment & Plan Note (Signed)
Last vitamin D Lab Results  Component Value Date   VD25OH 45.96 04/01/2020   Stable, cont oral replacement

## 2020-04-13 NOTE — Assessment & Plan Note (Signed)
Mild, for f/u BP at home and next visit, to call for ave BP athome > 140/90

## 2020-04-13 NOTE — Assessment & Plan Note (Signed)
Lab Results  Component Value Date   LDLCALC 127 (H) 04/01/2020   Stable, pt to continue current statin crestor 20   Current Outpatient Medications (Cardiovascular):  .  flecainide (TAMBOCOR) 50 MG tablet, Take 1 tablet (50 mg total) by mouth 2 (two) times daily. .  propranolol (INDERAL) 10 MG tablet, Take 1 tablet (10 mg total) by mouth 3 (three) times daily. .  rosuvastatin (CRESTOR) 20 MG tablet, Take 1 tablet (20 mg total) by mouth daily.   Current Outpatient Medications (Analgesics):  .  aspirin 81 MG tablet, Take 81 mg by mouth daily.   Current Outpatient Medications (Other):  .  omeprazole (PRILOSEC) 40 MG capsule, Take 1 capsule (40 mg total) by mouth daily.

## 2020-04-13 NOTE — Assessment & Plan Note (Signed)
?   ckd 3 - for f/u lab

## 2020-04-13 NOTE — Assessment & Plan Note (Signed)
Lab Results  Component Value Date   HGBA1C 5.4 04/01/2020   Stable, pt to continue current medical treatment  - diet

## 2020-06-16 ENCOUNTER — Encounter: Payer: Self-pay | Admitting: Cardiology

## 2020-06-16 ENCOUNTER — Other Ambulatory Visit: Payer: Self-pay

## 2020-06-16 ENCOUNTER — Ambulatory Visit (INDEPENDENT_AMBULATORY_CARE_PROVIDER_SITE_OTHER): Payer: 59 | Admitting: Cardiology

## 2020-06-16 VITALS — BP 124/64 | HR 51 | Ht 75.0 in | Wt 239.0 lb

## 2020-06-16 DIAGNOSIS — I480 Paroxysmal atrial fibrillation: Secondary | ICD-10-CM

## 2020-06-16 MED ORDER — PROPRANOLOL HCL 10 MG PO TABS: 10.0000 mg | ORAL_TABLET | Freq: Three times a day (TID) | ORAL | 3 refills | Status: DC

## 2020-06-16 MED ORDER — FLECAINIDE ACETATE 50 MG PO TABS: 50.0000 mg | ORAL_TABLET | Freq: Two times a day (BID) | ORAL | 3 refills | Status: DC

## 2020-06-16 NOTE — Addendum Note (Signed)
Addended by: Stanton Kidney on: 06/16/2020 04:52 PM   Modules accepted: Orders

## 2020-06-16 NOTE — Progress Notes (Signed)
Electrophysiology Office Note   Date:  06/16/2020   ID:  Joshua Li, DOB Oct 09, 1960, MRN [id number]  PCP:  Biagio Borg, MD  Cardiologist:  Ellyn Hack Primary Electrophysiologist:  Will Meredith Leeds, MD    No chief complaint on file.    History of Present Illness: Joshua Li is a 60 y.o. male who is being seen today for the evaluation of palpitations at the request of Glenetta Hew. Presenting today for electrophysiology evaluation.    He has a history of hyperlipidemia.  He was seen October 2018 was noted to have intermittent palpitations.  This is been going on for approximately 8 years.  He wore a 30-day monitor that showed multiple arrhythmias including PAT, PVCs, atrial flutter,, atrial fibrillation nonsustained VT.  He had an echo that showed normal ejection fraction the low risk Myoview.  He was put on syncope and propranolol.  Today, denies symptoms of palpitations, chest pain, shortness of breath, orthopnea, PND, lower extremity edema, claudication, dizziness, presyncope, syncope, bleeding, or neurologic sequela. The patient is tolerating medications without difficulties.  He has no chest pain or shortness of breath.  He is doing well.  He is able to do all of his daily activities.  He is unaware of long episodes of atrial fibrillation.  He has had 2 episodes at all been short-lived.   Past Medical History:  Diagnosis Date  . Allergy   . Asthma   . GERD (gastroesophageal reflux disease)   . Heart murmur   . Hiatal hernia   . Hyperlipidemia   . Ulcerative colitis 2001   Status post total colectomy with J-pouch in 2003 -    Past Surgical History:  Procedure Laterality Date  . CHOLECYSTECTOMY    . COLECTOMY  2003   with one step take-down to J-pouch  . COLECTOMY    . COLECTOMY  2003   total colectomy with J pouch (one step procedure)  . COLECTOMY  2003   total colectomy with j pouch ( one step)  . TONSILLECTOMY    . TOTAL COLECTOMY  2003   total colectomy  with J pouch      Current Outpatient Medications  Medication Sig Dispense Refill  . aspirin 81 MG tablet Take 81 mg by mouth daily.    . Cholecalciferol (VITAMIN D) 50 MCG (2000 UT) CAPS Take 2,000 Units by mouth daily.    . flecainide (TAMBOCOR) 50 MG tablet Take 1 tablet (50 mg total) by mouth 2 (two) times daily. 180 tablet 3  . loratadine (CLARITIN) 10 MG tablet Take 10 mg by mouth daily.    Marland Kitchen omeprazole (PRILOSEC) 40 MG capsule Take 1 capsule (40 mg total) by mouth daily. 90 capsule 3  . propranolol (INDERAL) 10 MG tablet Take 1 tablet (10 mg total) by mouth 3 (three) times daily. 270 tablet 3  . rosuvastatin (CRESTOR) 20 MG tablet Take 1 tablet (20 mg total) by mouth daily. 90 tablet 3   No current facility-administered medications for this visit.    Allergies:   Patient has no known allergies.   Social History:  The patient  reports that he has never smoked. He has never used smokeless tobacco. He reports current alcohol use of about 2.0 standard drinks of alcohol per week. He reports that he does not use drugs.   Family History:  The patient's family history includes Cancer in his mother; Heart attack (age of onset: 73) in his father; Lymphoma (age of onset: 30) in his  brother; Stomach cancer (age of onset: 32) in his father.    ROS:  Please see the history of present illness.   Otherwise, review of systems is positive for none.   All other systems are reviewed and negative.   PHYSICAL EXAM: VS:  BP 124/64   Pulse (!) 51   Ht 6' 3"  (1.905 m)   Wt 239 lb (108.4 kg)   SpO2 96%   BMI 29.87 kg/m  , BMI Body mass index is 29.87 kg/m. GEN: Well nourished, well developed, in no acute distress  HEENT: normal  Neck: no JVD, carotid bruits, or masses Cardiac: RRR; no murmurs, rubs, or gallops,no edema  Respiratory:  clear to auscultation bilaterally, normal work of breathing GI: soft, nontender, nondistended, + BS MS: no deformity or atrophy  Skin: warm and dry Neuro:   Strength and sensation are intact Psych: euthymic mood, full affect  EKG:  EKG is ordered today. Personal review of the ekg ordered shows sinus rhythm rate 51   Recent Labs: 04/01/2020: ALT 31; BUN 16; Creatinine, Ser 1.22; Hemoglobin 16.2; Platelets 216.0; Potassium 3.6; Sodium 140; TSH 1.99    Lipid Panel     Component Value Date/Time   CHOL 197 04/01/2020 1402   TRIG 75.0 04/01/2020 1402   TRIG 74 04/10/2006 0808   HDL 55.40 04/01/2020 1402   CHOLHDL 4 04/01/2020 1402   VLDL 15.0 04/01/2020 1402   LDLCALC 127 (H) 04/01/2020 1402   LDLDIRECT 123.3 03/02/2009 0832     Wt Readings from Last 3 Encounters:  06/16/20 239 lb (108.4 kg)  04/01/20 232 lb (105.2 kg)  06/18/19 237 lb 9.6 oz (107.8 kg)      Other studies Reviewed: Additional studies/ records that were reviewed today include: TTE 03/16/17  Review of the above records today demonstrates:  - Left ventricle: The cavity size was normal. Systolic function was   normal. The estimated ejection fraction was in the range of 60%   to 65%. Wall motion was normal; there were no regional wall   motion abnormalities. - Atrial septum: No defect or patent foramen ovale was identified. - Left atrium:  The atrium was normal in size.  Myoview 05/24/17  There was no ST segment deviation noted during stress. AFIB.  This is a low risk study. No perfusion defects. No ischemia.  ETT 06/15/17 - personally reviewed  Blood pressure demonstrated a normal response to exercise.  Upsloping ST segment depression ST segment depression of 1 mm was noted during stress in the II, III, aVF, V5 and V6 leads.   Positive ETT 1 mm upsloping ST segment depression in inferior lateral leads with stress Normal hemodynamic response  ASSESSMENT AND PLAN:  1.  Nonsustained VT: Had 5-20 beats on cardiac monitoring.  Ejection fraction normal with low risk Myoview.  No further episodes on flecainide.  No medication changes at this time.  2.  SVT: No  further episodes since starting flecainide  3.  Paroxysmal atrial fibrillation/atrial flutter: Currently on flecainide and propranolol.  CHA2DS2-VASc of 0.  High risk medication monitoring.  He has not had any further episodes of atrial fibrillation.  He has minimal palpitations.  No changes.  Current medicines are reviewed at length with the patient today.   The patient does not have concerns regarding his medicines.  The following changes were made today: None  Labs/ tests ordered today include:  Orders Placed This Encounter  Procedures  . EKG 12-Lead    Disposition:   FU with  Will Camnitz 12 months  Signed, Will Meredith Leeds, MD  06/16/2020 4:37 PM     [address] [place] [address] [phone] (office) [phone] (fax)

## 2020-10-08 ENCOUNTER — Telehealth: Payer: Self-pay | Admitting: *Deleted

## 2020-10-08 ENCOUNTER — Telehealth: Payer: 59 | Admitting: Family Medicine

## 2020-10-08 ENCOUNTER — Encounter: Payer: Self-pay | Admitting: Family Medicine

## 2020-10-08 VITALS — Wt 230.0 lb

## 2020-10-08 DIAGNOSIS — R059 Cough, unspecified: Secondary | ICD-10-CM | POA: Diagnosis not present

## 2020-10-08 DIAGNOSIS — R062 Wheezing: Secondary | ICD-10-CM | POA: Diagnosis not present

## 2020-10-08 MED ORDER — BENZONATATE 100 MG PO CAPS: 100.0000 mg | ORAL_CAPSULE | Freq: Three times a day (TID) | ORAL | 0 refills | Status: DC | PRN

## 2020-10-08 MED ORDER — FLUTICASONE PROPIONATE HFA 44 MCG/ACT IN AERO: 2.0000 | INHALATION_SPRAY | Freq: Two times a day (BID) | RESPIRATORY_TRACT | 0 refills | Status: DC

## 2020-10-08 NOTE — Patient Instructions (Addendum)
-  I sent the medication(s) we discussed to your pharmacy: Meds ordered this encounter  Medications   fluticasone (FLOVENT HFA) 44 MCG/ACT inhaler    Sig: Inhale 2 puffs into the lungs in the morning and at bedtime.    Dispense:  1 each    Refill:  0   benzonatate (TESSALON PERLES) 100 MG capsule    Sig: Take 1 capsule (100 mg total) by mouth 3 (three) times daily as needed.    Dispense:  20 capsule    Refill:  0   Change from Claritin to Zyrtec and take once daily  Schedule an INPERSON follow up with your primary care office in about 1 week.   I hope you are feeling better soon!  Seek in person care promptly if your symptoms worsen, new concerns arise or you are not improving with treatment.  It was nice to meet you today. I help [facility] out with telemedicine visits on Tuesdays and Thursdays and am available for visits on those days. If you have any concerns or questions following this visit please schedule a follow up visit with your Primary Care doctor or seek care at a local urgent care clinic to avoid delays in care.

## 2020-10-08 NOTE — Telephone Encounter (Signed)
Spoke with the Joshua Li and advised he call Dr Gwynn Burly office to schedule an appt as I am not aware of their office policy for appts and cannot schedule for another office.

## 2020-10-08 NOTE — Telephone Encounter (Signed)
-----   Message from Lucretia Kern, DO sent at 10/08/2020  1:12 PM EDT ----- Please assist with scheduling INperson follow up in about 1 week for chronic cough (>6 weeks) and night sweats (2 years). Thanks!

## 2020-10-08 NOTE — Progress Notes (Signed)
Virtual Visit via Video Note  I connected with Joshua Li  on 10/08/20 at  1:00 PM EDT by a video enabled telemedicine application and verified that I am speaking with the correct person using two identifiers.  Location patient: home, [place] Location provider:work or home office Persons participating in the virtual visit: patient, provider  I discussed the limitations of evaluation and management by telemedicine and the availability of in person appointments. The patient expressed understanding and agreed to proceed.   HPI:  Acute telemedicine visit for Cough: -Onset: cough started about 6 weeks ago  -rapid covid test negative a few days ago -Symptoms include: sinus congestion maybe, PND, cough, wheezing daily throughout day, worse at night, nights sweats rarely chronic for several years -Denies:fevers, weight loss, coughing up blood, malaise, sinus pain, NVD, CP, SOB -Has tried: claritin, nasonex -Pertinent past medical history: seasonal allergies, reports has been told in the past he may have asthma - was on pulmicort in the past; has had wheezing with allergies in the past -Pertinent medication allergies: No Known Allergies -COVID-19 vaccine status:2 doses + 2 boosters  ROS: See pertinent positives and negatives per HPI.  Past Medical History:  Diagnosis Date   Allergy    Asthma    GERD (gastroesophageal reflux disease)    Heart murmur    Hiatal hernia    Hyperlipidemia    Ulcerative colitis 2001   Status post total colectomy with J-pouch in 2003 -     Past Surgical History:  Procedure Laterality Date   CHOLECYSTECTOMY     COLECTOMY  2003   with one step take-down to J-pouch   COLECTOMY     COLECTOMY  2003   total colectomy with J pouch (one step procedure)   COLECTOMY  2003   total colectomy with j pouch ( one step)   TONSILLECTOMY     TOTAL COLECTOMY  2003   total colectomy with J pouch      Current Outpatient Medications:    aspirin 81 MG tablet, Take 81 mg by mouth  daily., Disp: , Rfl:    benzonatate (TESSALON PERLES) 100 MG capsule, Take 1 capsule (100 mg total) by mouth 3 (three) times daily as needed., Disp: 20 capsule, Rfl: 0   Cholecalciferol (VITAMIN D) 50 MCG (2000 UT) CAPS, Take 2,000 Units by mouth daily., Disp: , Rfl:    flecainide (TAMBOCOR) 50 MG tablet, Take 1 tablet (50 mg total) by mouth 2 (two) times daily., Disp: 180 tablet, Rfl: 3   fluticasone (FLOVENT HFA) 44 MCG/ACT inhaler, Inhale 2 puffs into the lungs in the morning and at bedtime., Disp: 1 each, Rfl: 0   loratadine (CLARITIN) 10 MG tablet, Take 10 mg by mouth daily., Disp: , Rfl:    omeprazole (PRILOSEC) 40 MG capsule, Take 1 capsule (40 mg total) by mouth daily., Disp: 90 capsule, Rfl: 3   propranolol (INDERAL) 10 MG tablet, Take 1 tablet (10 mg total) by mouth 3 (three) times daily., Disp: 270 tablet, Rfl: 3   rosuvastatin (CRESTOR) 20 MG tablet, Take 1 tablet (20 mg total) by mouth daily., Disp: 90 tablet, Rfl: 3  EXAM:  VITALS per patient if applicable:  GENERAL: alert, oriented, appears well and in no acute distress  HEENT: atraumatic, conjunttiva clear, no obvious abnormalities on inspection of external nose and ears  NECK: normal movements of the head and neck  LUNGS: on inspection no signs of respiratory distress, breathing rate appears normal, no obvious gross SOB, gasping or wheezing  CV: no obvious cyanosis  MS: moves all visible extremities without noticeable abnormality  PSYCH/NEURO: pleasant and cooperative, no obvious depression or anxiety, speech and thought processing grossly intact  ASSESSMENT AND PLAN:  Discussed the following assessment and plan:  Cough  Wheezing  -we discussed possible serious and likely etiologies, options for evaluation and workup, limitations of telemedicine visit vs in person visit, treatment, treatment risks and precautions. Advised inperson visit would be best given his history and symptoms. This could be his  allergy/asthma, but with his PMH and the night sweats inperson visit is warranted. He reports he tried to get an inperson visit but was told had to do a virtual visit first. He is concerned about taking meds that could impact his heart. Opted for trial of changing antihistamine, starting flovent, tessalon for cough and advised close inperson follow up for the night sweats and to ensure cough improving. He is agreeable. Sent message to his PCP office and to Wendie Simmer at [facility] to assist with scheduling follow up and advised patient to contact PCP office to schedule if does not receive call back in next 24 hours. Advised to seek prompt in person care in the interim if worsening, new symptoms arise, or if is not improving with treatment. Discussed options for inperson care if PCP office not available. Did let this patient know that I only do telemedicine on Tuesdays and Thursdays for [place]. Advised to schedule follow up visit with PCP or [facility] if any further questions or concerns to avoid delays in care.   I discussed the assessment and treatment plan with the patient. The patient was provided an opportunity to ask questions and all were answered. The patient agreed with the plan and demonstrated an understanding of the instructions.     Lucretia Kern, DO

## 2020-10-09 ENCOUNTER — Encounter: Payer: Self-pay | Admitting: Family Medicine

## 2020-10-12 ENCOUNTER — Other Ambulatory Visit: Payer: Self-pay | Admitting: Family Medicine

## 2020-10-12 ENCOUNTER — Telehealth: Payer: Self-pay

## 2020-10-12 NOTE — Telephone Encounter (Signed)
Pt declines to schedule appt at this time because he has not used the inhaler that was prescribed (see mychart message from 10/09/20).  He said he does not have time to call the pharmacy for covered alternatives.  He will schedule the appt when able.

## 2020-10-26 ENCOUNTER — Ambulatory Visit: Payer: 59 | Admitting: Internal Medicine

## 2020-10-26 ENCOUNTER — Other Ambulatory Visit: Payer: Self-pay

## 2020-10-26 ENCOUNTER — Encounter: Payer: Self-pay | Admitting: Internal Medicine

## 2020-10-26 ENCOUNTER — Ambulatory Visit (INDEPENDENT_AMBULATORY_CARE_PROVIDER_SITE_OTHER): Payer: 59

## 2020-10-26 VITALS — BP 120/68 | HR 43 | Temp 98.4°F | Ht 75.0 in | Wt 241.0 lb

## 2020-10-26 DIAGNOSIS — R490 Dysphonia: Secondary | ICD-10-CM

## 2020-10-26 DIAGNOSIS — J309 Allergic rhinitis, unspecified: Secondary | ICD-10-CM | POA: Diagnosis not present

## 2020-10-26 DIAGNOSIS — E7800 Pure hypercholesterolemia, unspecified: Secondary | ICD-10-CM

## 2020-10-26 DIAGNOSIS — R059 Cough, unspecified: Secondary | ICD-10-CM

## 2020-10-26 DIAGNOSIS — R001 Bradycardia, unspecified: Secondary | ICD-10-CM | POA: Diagnosis not present

## 2020-10-26 MED ORDER — METHYLPREDNISOLONE ACETATE 80 MG/ML IJ SUSP
80.0000 mg | Freq: Once | INTRAMUSCULAR | Status: AC
  Administered 2020-10-26: 80 mg via INTRAMUSCULAR

## 2020-10-26 MED ORDER — PREDNISONE 10 MG PO TABS: ORAL_TABLET | ORAL | 0 refills | Status: DC

## 2020-10-26 NOTE — Assessment & Plan Note (Signed)
Lab Results  Component Value Date   LDLCALC 127 (H) 04/01/2020   Uncontrlled, goal ldl < 70,  pt to continue current statin crestor 20 as declines change

## 2020-10-26 NOTE — Patient Instructions (Addendum)
Please take all new medication as prescribed   - the prednisone  You had the steroid shot today  Please continue the claritin, but consider also taking nasacort 2 sprays daily (OTC)  Please continue all other medications as before, and refills have been done if requested.  Please have the pharmacy call with any other refills you may need.  Please continue your efforts at being more active, low cholesterol diet, and weight control  Please keep your appointments with your specialists as you may have planned  Please go to the XRAY Department in the first floor for the x-ray testing  You will be contacted by phone if any changes need to be made immediately.  Otherwise, you will receive a letter about your results with an explanation, but please check with MyChart first.  Please remember to sign up for MyChart if you have not done so, as this will be important to you in the future with finding out test results, communicating by private email, and scheduling acute appointments online when needed.

## 2020-10-26 NOTE — Assessment & Plan Note (Signed)
Seems to tolerate wel, declines need for change in tx, to also f/u cardiology as planned

## 2020-10-26 NOTE — Progress Notes (Signed)
Patient ID: Joshua Li, male   DOB: 1960/03/29, 60 y.o.   MRN: [id number]        Chief Complaint: follow up cough, wheezing, hld, bradycardia       HPI:  Joshua Li is a 59 y.o. male here with c/o recent onset 2 months non prod cough wheezing of unclear etiology without fever chills but with wheeziness, and Pt denies chest pain, increased sob or doe, orthopnea, PND, increased LE swelling, palpitations, dizziness or syncope.  Was seen and tx per Dr Maudie Mercury with improved symptoms after tessalon perle and flonase,  Symptoms improved temporarily, then worsened again this time with Hoarseness getting worse.  Also with low HR today but o/w asymptomatic.  Trying to follow lower chol diet.  No recent cxr.  Wt Readings from Last 3 Encounters:  10/26/20 241 lb (109.3 kg)  10/08/20 230 lb (104.3 kg)  06/16/20 239 lb (108.4 kg)   BP Readings from Last 3 Encounters:  10/26/20 120/68  06/16/20 124/64  04/01/20 120/86         Past Medical History:  Diagnosis Date   Allergy    Asthma    GERD (gastroesophageal reflux disease)    Heart murmur    Hiatal hernia    Hyperlipidemia    Ulcerative colitis 2001   Status post total colectomy with J-pouch in 2003 -    Past Surgical History:  Procedure Laterality Date   CHOLECYSTECTOMY     COLECTOMY  2003   with one step take-down to J-pouch   COLECTOMY     COLECTOMY  2003   total colectomy with J pouch (one step procedure)   COLECTOMY  2003   total colectomy with j pouch ( one step)   TONSILLECTOMY     TOTAL COLECTOMY  2003   total colectomy with J pouch     reports that he has never smoked. He has never used smokeless tobacco. He reports current alcohol use of about 2.0 standard drinks of alcohol per week. He reports that he does not use drugs. family history includes Cancer in his mother; Heart attack (age of onset: 86) in his father; Lymphoma (age of onset: 74) in his brother; Stomach cancer (age of onset: 61) in his father. No Known  Allergies Current Outpatient Medications on File Prior to Visit  Medication Sig Dispense Refill   aspirin 81 MG tablet Take 81 mg by mouth daily.     benzonatate (TESSALON PERLES) 100 MG capsule Take 1 capsule (100 mg total) by mouth 3 (three) times daily as needed. 20 capsule 0   Cholecalciferol (VITAMIN D) 50 MCG (2000 UT) CAPS Take 2,000 Units by mouth daily.     flecainide (TAMBOCOR) 50 MG tablet Take 1 tablet (50 mg total) by mouth 2 (two) times daily. 180 tablet 3   fluticasone (FLOVENT HFA) 44 MCG/ACT inhaler Inhale 2 puffs into the lungs in the morning and at bedtime. 1 each 0   loratadine (CLARITIN) 10 MG tablet Take 10 mg by mouth daily.     omeprazole (PRILOSEC) 40 MG capsule Take 1 capsule (40 mg total) by mouth daily. 90 capsule 3   propranolol (INDERAL) 10 MG tablet Take 1 tablet (10 mg total) by mouth 3 (three) times daily. 270 tablet 3   rosuvastatin (CRESTOR) 20 MG tablet Take 1 tablet (20 mg total) by mouth daily. 90 tablet 3   No current facility-administered medications on file prior to visit.        ROS:  All others reviewed and negative.  Objective        PE:  BP 120/68 (BP Location: Left Arm, Patient Position: Sitting, Cuff Size: Large)   Pulse (!) 43   Temp 98.4 F (36.9 C) (Oral)   Ht 6' 3"  (1.905 m)   Wt 241 lb (109.3 kg)   SpO2 98%   BMI 30.12 kg/m                 Constitutional: Pt appears in NAD               HENT: Head: NCAT.                Right Ear: External ear normal.                 Left Ear: External ear normal.                Eyes: . Pupils are equal, round, and reactive to light. Conjunctivae and EOM are normal; Bilat tm's with mild erythema.  Max sinus areas non tender.  Pharynx with mild erythema, no exudate               Nose: without d/c or deformity               Neck: Neck supple. Gross normal ROM               Cardiovascular: Normal rate and regular rhythm.                 Pulmonary/Chest: Effort normal and breath sounds without rales  or wheezing.                Abd:  Soft, NT, ND, + BS, no organomegaly               Neurological: Pt is alert. At baseline orientation, motor grossly intact               Skin: Skin is warm. No rashes, no other new lesions, LE edema - none               Psychiatric: Pt behavior is normal without agitation   Micro: none  Cardiac tracings I have personally interpreted today:  none  Pertinent Radiological findings (summarize): none   Lab Results  Component Value Date   WBC 8.3 04/01/2020   HGB 16.2 04/01/2020   HCT 46.0 04/01/2020   PLT 216.0 04/01/2020   GLUCOSE 83 04/01/2020   CHOL 197 04/01/2020   TRIG 75.0 04/01/2020   HDL 55.40 04/01/2020   LDLDIRECT 123.3 03/02/2009   LDLCALC 127 (H) 04/01/2020   ALT 31 04/01/2020   AST 18 04/01/2020   NA 140 04/01/2020   K 3.6 04/01/2020   CL 105 04/01/2020   CREATININE 1.22 04/01/2020   BUN 16 04/01/2020   CO2 29 04/01/2020   TSH 1.99 04/01/2020   PSA 0.83 04/01/2020   HGBA1C 5.4 04/01/2020   Assessment/Plan:  Joshua Li is a 60 y.o. White or Caucasian [1] male with  has a past medical history of Allergy, Asthma, GERD (gastroesophageal reflux disease), Heart murmur, Hiatal hernia, Hyperlipidemia, and Ulcerative colitis (2001).  Hyperlipidemia Lab Results  Component Value Date   LDLCALC 127 (H) 04/01/2020   Uncontrlled, goal ldl < 70,  pt to continue current statin crestor 20 as declines change   Cough Etiology unclear, I suspect post nasal gtt related but cant r/o other, for cxr, but also depomedrol im  80, predpac asd, and  to f/u any worsening symptoms or concerns    Allergic rhinitis Also for restart claritin, and add nasacort asd,  to f/u any worsening symptoms or concerns  Bradycardia Seems to tolerate wel, declines need for change in tx, to also f/u cardiology as planned  Followup: Return if symptoms worsen or fail to improve.  Cathlean Cower, MD 10/26/2020 10:17 PM [facility]

## 2020-10-26 NOTE — Assessment & Plan Note (Signed)
Etiology unclear, I suspect post nasal gtt related but cant r/o other, for cxr, but also depomedrol im 80, predpac asd, and  to f/u any worsening symptoms or concerns

## 2020-10-26 NOTE — Assessment & Plan Note (Signed)
Also for restart claritin, and add nasacort asd,  to f/u any worsening symptoms or concerns

## 2020-10-27 ENCOUNTER — Other Ambulatory Visit: Payer: Self-pay | Admitting: Internal Medicine

## 2020-10-27 ENCOUNTER — Encounter: Payer: Self-pay | Admitting: Internal Medicine

## 2020-10-27 MED ORDER — LEVOFLOXACIN 500 MG PO TABS: 500.0000 mg | ORAL_TABLET | Freq: Every day | ORAL | 0 refills | Status: AC

## 2020-11-10 ENCOUNTER — Other Ambulatory Visit: Payer: Self-pay | Admitting: Family Medicine

## 2021-01-24 ENCOUNTER — Other Ambulatory Visit: Payer: Self-pay | Admitting: Internal Medicine

## 2021-01-25 NOTE — Telephone Encounter (Signed)
Please refill as per office routine med refill policy (all routine meds to be refilled for 3 mo or monthly (per pt preference) up to one year from last visit, then month to month grace period for 3 mo, then further med refills will have to be denied) ? ?

## 2021-02-04 ENCOUNTER — Encounter: Payer: Self-pay | Admitting: *Deleted

## 2021-02-04 ENCOUNTER — Ambulatory Visit: Payer: 59 | Admitting: Nurse Practitioner

## 2021-02-04 ENCOUNTER — Other Ambulatory Visit: Payer: Self-pay

## 2021-02-04 VITALS — BP 126/70 | HR 46 | Temp 97.3°F | Resp 10 | Ht 75.0 in | Wt 239.1 lb

## 2021-02-04 DIAGNOSIS — H60311 Diffuse otitis externa, right ear: Secondary | ICD-10-CM | POA: Insufficient documentation

## 2021-02-04 MED ORDER — OFLOXACIN 0.3 % OT SOLN: 10.0000 [drp] | Freq: Every day | OTIC | 0 refills | Status: AC

## 2021-02-04 NOTE — Progress Notes (Signed)
Acute Office Visit  Subjective:    Patient ID: Joshua Li, male    DOB: March 06, 1961, 60 y.o.   MRN: [id number]  Chief Complaint  Patient presents with   Ear Pain    Right ear, for about 1 1/2 weeks. Decreased hearing. Has used some OTC ear drops for wax. Did see a black stain on a qtip the other day when he tried cleaning it.    HPI Patient is in today for Right ear Started approx 1.5 weeks Has been using qtip with dark stuff Used OTC drops with no help  Past Medical History:  Diagnosis Date   Allergy    Asthma    GERD (gastroesophageal reflux disease)    Heart murmur    Hiatal hernia    Hyperlipidemia    Ulcerative colitis 2001   Status post total colectomy with J-pouch in 2003 -     Past Surgical History:  Procedure Laterality Date   CHOLECYSTECTOMY     COLECTOMY  2003   with one step take-down to J-pouch   COLECTOMY     COLECTOMY  2003   total colectomy with J pouch (one step procedure)   COLECTOMY  2003   total colectomy with j pouch ( one step)   TONSILLECTOMY     TOTAL COLECTOMY  2003   total colectomy with J pouch     Family History  Problem Relation Age of Onset   Cancer Mother        breast and ovarian   Heart attack Father 48   Stomach cancer Father 66       stomach & bladder -cause of death   Lymphoma Brother 46       Currently 93   Colon cancer Neg Hx    Esophageal cancer Neg Hx    Pancreatic cancer Neg Hx    Rectal cancer Neg Hx     Social History   Socioeconomic History   Marital status: Divorced    Spouse name: Not on file   Number of children: 1   Years of education: 16   Highest education level: Bachelor's degree (e.g., BA, AB, BS)  Occupational History   Occupation: Retail    Comment: [employer]  Tobacco Use   Smoking status: Never   Smokeless tobacco: Never  Vaping Use   Vaping Use: Never used  Substance and Sexual Activity   Alcohol use: Yes    Alcohol/week: 2.0 standard drinks    Types: 2 Cans of beer per week   Drug  use: No   Sexual activity: Yes  Other Topics Concern   Not on file  Social History Narrative   Fun: Geocaching   Denies religious beliefs effecting health care.    Social Determinants of Health   Financial Resource Strain: Not on file  Food Insecurity: Not on file  Transportation Needs: Not on file  Physical Activity: Not on file  Stress: Not on file  Social Connections: Not on file  Intimate Partner Violence: Not on file    Outpatient Medications Prior to Visit  Medication Sig Dispense Refill   aspirin 81 MG tablet Take 81 mg by mouth daily.     Cholecalciferol (VITAMIN D) 50 MCG (2000 UT) CAPS Take 2,000 Units by mouth daily.     flecainide (TAMBOCOR) 50 MG tablet Take 1 tablet (50 mg total) by mouth 2 (two) times daily. 180 tablet 3   omeprazole (PRILOSEC) 40 MG capsule Take 1 capsule (40 mg total) by  mouth daily. 90 capsule 3   propranolol (INDERAL) 10 MG tablet Take 1 tablet (10 mg total) by mouth 3 (three) times daily. 270 tablet 3   rosuvastatin (CRESTOR) 20 MG tablet Take 1 tablet (20 mg total) by mouth daily. 90 tablet 3   loratadine (CLARITIN) 10 MG tablet Take 10 mg by mouth daily. (Patient not taking: Reported on 02/04/2021)     benzonatate (TESSALON PERLES) 100 MG capsule Take 1 capsule (100 mg total) by mouth 3 (three) times daily as needed. 20 capsule 0   fluticasone (FLOVENT HFA) 44 MCG/ACT inhaler Inhale 2 puffs into the lungs in the morning and at bedtime. 1 each 0   predniSONE (DELTASONE) 10 MG tablet 2 tabs by mouth per day for 5 days 10 tablet 0   No facility-administered medications prior to visit.    No Known Allergies  Review of Systems  Constitutional:  Negative for chills and fever.  HENT:  Positive for ear pain. Negative for ear discharge.   Respiratory:  Negative for cough and shortness of breath.   Cardiovascular:  Negative for chest pain.  Gastrointestinal:  Negative for diarrhea, nausea and vomiting.  Neurological:  Negative for dizziness and  light-headedness.      Objective:    Physical Exam Vitals and nursing note reviewed.  Constitutional:      Appearance: Normal appearance.  HENT:     Right Ear: Ear canal and external ear normal. There is impacted cerumen.     Left Ear: Tympanic membrane, ear canal and external ear normal. There is no impacted cerumen.     Mouth/Throat:     Mouth: Mucous membranes are moist.     Pharynx: Oropharynx is clear.  Eyes:     Extraocular Movements: Extraocular movements intact.  Cardiovascular:     Rate and Rhythm: Regular rhythm. Bradycardia present.  Pulmonary:     Effort: Pulmonary effort is normal.     Breath sounds: Normal breath sounds.  Neurological:     Mental Status: He is alert.  Psychiatric:        Mood and Affect: Mood normal.        Behavior: Behavior normal.        Thought Content: Thought content normal.        Judgment: Judgment normal.    BP 126/70   Pulse (!) 46   Temp (!) 97.3 F (36.3 C)   Resp 10   Ht 6' 3"  (1.905 m)   Wt 239 lb 2 oz (108.5 kg)   SpO2 97%   BMI 29.89 kg/m  Wt Readings from Last 3 Encounters:  02/04/21 239 lb 2 oz (108.5 kg)  10/26/20 241 lb (109.3 kg)  10/08/20 230 lb (104.3 kg)    Health Maintenance Due  Topic Date Due   Pneumococcal Vaccine 80-29 Years old (1 - PCV) Never done   COVID-19 Vaccine (5 - Booster) 10/08/2020   Zoster Vaccines- Shingrix (2 of 2) 10/08/2020    There are no preventive care reminders to display for this patient.   Lab Results  Component Value Date   TSH 1.99 04/01/2020   Lab Results  Component Value Date   WBC 8.3 04/01/2020   HGB 16.2 04/01/2020   HCT 46.0 04/01/2020   MCV 86.9 04/01/2020   PLT 216.0 04/01/2020   Lab Results  Component Value Date   NA 140 04/01/2020   K 3.6 04/01/2020   CO2 29 04/01/2020   GLUCOSE 83 04/01/2020   BUN 16 04/01/2020  CREATININE 1.22 04/01/2020   BILITOT 0.9 04/01/2020   ALKPHOS 52 04/01/2020   AST 18 04/01/2020   ALT 31 04/01/2020   PROT 7.3  04/01/2020   ALBUMIN 4.5 04/01/2020   CALCIUM 9.1 04/01/2020   GFR 64.09 04/01/2020   Lab Results  Component Value Date   CHOL 197 04/01/2020   Lab Results  Component Value Date   HDL 55.40 04/01/2020   Lab Results  Component Value Date   LDLCALC 127 (H) 04/01/2020   Lab Results  Component Value Date   TRIG 75.0 04/01/2020   Lab Results  Component Value Date   CHOLHDL 4 04/01/2020   Lab Results  Component Value Date   HGBA1C 5.4 04/01/2020       Assessment & Plan:   Problem List Items Addressed This Visit       Nervous and Auditory   Acute diffuse otitis externa of right ear - Primary    Ear canal was swollen and tender.  Also some tragal tenderness patient had left cerumen impaction but unable to clear after several attempts.  Will refer to ENT and treat for ear infection.  Patient consent was gotten verbally to perform ear irrigation in office.  Per policy some cerumen softening drops were placed in ear prior to procedure continue with office policy water and peroxide mixture was used in bottle and irrigated into the ear without success patient tolerated fairly well.      Relevant Medications   ofloxacin (FLOXIN OTIC) 0.3 % OTIC solution   Other Relevant Orders   Ambulatory referral to ENT     No orders of the defined types were placed in this encounter.  This visit occurred during the SARS-CoV-2 public health emergency.  Safety protocols were in place, including screening questions prior to the visit, additional usage of staff PPE, and extensive cleaning of exam room while observing appropriate contact time as indicated for disinfecting solutions.   Romilda Garret, NP

## 2021-02-04 NOTE — Patient Instructions (Signed)
Nice to see you today Will send in some ear drops for the ear  Also referred you to ENT

## 2021-02-04 NOTE — Assessment & Plan Note (Signed)
Ear canal was swollen and tender.  Also some tragal tenderness patient had left cerumen impaction but unable to clear after several attempts.  Will refer to ENT and treat for ear infection.  Patient consent was gotten verbally to perform ear irrigation in office.  Per policy some cerumen softening drops were placed in ear prior to procedure continue with office policy water and peroxide mixture was used in bottle and irrigated into the ear without success patient tolerated fairly well.

## 2021-02-22 ENCOUNTER — Other Ambulatory Visit: Payer: Self-pay | Admitting: Internal Medicine

## 2021-02-22 NOTE — Telephone Encounter (Signed)
Please refill as per office routine med refill policy (all routine meds to be refilled for 3 mo or monthly (per pt preference) up to one year from last visit, then month to month grace period for 3 mo, then further med refills will have to be denied) ? ?

## 2021-04-05 ENCOUNTER — Ambulatory Visit (INDEPENDENT_AMBULATORY_CARE_PROVIDER_SITE_OTHER): Payer: 59

## 2021-04-05 ENCOUNTER — Encounter: Payer: Self-pay | Admitting: Internal Medicine

## 2021-04-05 ENCOUNTER — Other Ambulatory Visit: Payer: Self-pay

## 2021-04-05 ENCOUNTER — Ambulatory Visit (INDEPENDENT_AMBULATORY_CARE_PROVIDER_SITE_OTHER): Payer: 59 | Admitting: Internal Medicine

## 2021-04-05 VITALS — BP 110/66 | HR 49 | Ht 75.0 in | Wt 238.0 lb

## 2021-04-05 DIAGNOSIS — J309 Allergic rhinitis, unspecified: Secondary | ICD-10-CM | POA: Diagnosis not present

## 2021-04-05 DIAGNOSIS — Z0001 Encounter for general adult medical examination with abnormal findings: Secondary | ICD-10-CM | POA: Diagnosis not present

## 2021-04-05 DIAGNOSIS — E559 Vitamin D deficiency, unspecified: Secondary | ICD-10-CM | POA: Diagnosis not present

## 2021-04-05 DIAGNOSIS — R9389 Abnormal findings on diagnostic imaging of other specified body structures: Secondary | ICD-10-CM

## 2021-04-05 DIAGNOSIS — R221 Localized swelling, mass and lump, neck: Secondary | ICD-10-CM | POA: Diagnosis not present

## 2021-04-05 DIAGNOSIS — R739 Hyperglycemia, unspecified: Secondary | ICD-10-CM

## 2021-04-05 DIAGNOSIS — J45991 Cough variant asthma: Secondary | ICD-10-CM | POA: Diagnosis not present

## 2021-04-05 DIAGNOSIS — R001 Bradycardia, unspecified: Secondary | ICD-10-CM

## 2021-04-05 DIAGNOSIS — K219 Gastro-esophageal reflux disease without esophagitis: Secondary | ICD-10-CM | POA: Diagnosis not present

## 2021-04-05 DIAGNOSIS — E7800 Pure hypercholesterolemia, unspecified: Secondary | ICD-10-CM

## 2021-04-05 DIAGNOSIS — E538 Deficiency of other specified B group vitamins: Secondary | ICD-10-CM

## 2021-04-05 DIAGNOSIS — K449 Diaphragmatic hernia without obstruction or gangrene: Secondary | ICD-10-CM | POA: Diagnosis not present

## 2021-04-05 LAB — CBC WITH DIFFERENTIAL/PLATELET
Basophils Absolute: 0 10*3/uL (ref 0.0–0.1)
Basophils Relative: 0.4 % (ref 0.0–3.0)
Eosinophils Absolute: 0.1 10*3/uL (ref 0.0–0.7)
Eosinophils Relative: 1.9 % (ref 0.0–5.0)
HCT: 48 % (ref 39.0–52.0)
Hemoglobin: 16.3 g/dL (ref 13.0–17.0)
Lymphocytes Relative: 32.3 % (ref 12.0–46.0)
Lymphs Abs: 2.4 10*3/uL (ref 0.7–4.0)
MCHC: 34 g/dL (ref 30.0–36.0)
MCV: 88 fl (ref 78.0–100.0)
Monocytes Absolute: 0.5 10*3/uL (ref 0.1–1.0)
Monocytes Relative: 7.1 % (ref 3.0–12.0)
Neutro Abs: 4.4 10*3/uL (ref 1.4–7.7)
Neutrophils Relative %: 58.3 % (ref 43.0–77.0)
Platelets: 243 10*3/uL (ref 150.0–400.0)
RBC: 5.46 Mil/uL (ref 4.22–5.81)
RDW: 13.4 % (ref 11.5–15.5)
WBC: 7.5 10*3/uL (ref 4.0–10.5)

## 2021-04-05 LAB — HEPATIC FUNCTION PANEL
ALT: 20 U/L (ref 0–53)
AST: 15 U/L (ref 0–37)
Albumin: 4.4 g/dL (ref 3.5–5.2)
Alkaline Phosphatase: 48 U/L (ref 39–117)
Bilirubin, Direct: 0.2 mg/dL (ref 0.0–0.3)
Total Bilirubin: 0.9 mg/dL (ref 0.2–1.2)
Total Protein: 7.3 g/dL (ref 6.0–8.3)

## 2021-04-05 LAB — VITAMIN D 25 HYDROXY (VIT D DEFICIENCY, FRACTURES): VITD: 33.27 ng/mL (ref 30.00–100.00)

## 2021-04-05 LAB — VITAMIN B12: Vitamin B-12: 257 pg/mL (ref 211–911)

## 2021-04-05 LAB — PSA: PSA: 1.04 ng/mL (ref 0.10–4.00)

## 2021-04-05 LAB — URINALYSIS, ROUTINE W REFLEX MICROSCOPIC
Ketones, ur: NEGATIVE
Leukocytes,Ua: NEGATIVE
Nitrite: NEGATIVE
Specific Gravity, Urine: >=1.03 — AB (ref 1.000–1.030)
Total Protein, Urine: NEGATIVE
Urine Glucose: NEGATIVE
Urobilinogen, UA: 0.2 (ref 0.0–1.0)
WBC, UA: NONE SEEN (ref 0–?)
pH: 6 (ref 5.0–8.0)

## 2021-04-05 LAB — BASIC METABOLIC PANEL
BUN: 12 mg/dL (ref 6–23)
CO2: 30 mEq/L (ref 19–32)
Calcium: 9.5 mg/dL (ref 8.4–10.5)
Chloride: 103 mEq/L (ref 96–112)
Creatinine, Ser: 1.17 mg/dL (ref 0.40–1.50)
GFR: 66.91 mL/min (ref >60.00)
Glucose, Bld: 101 mg/dL — ABNORMAL HIGH (ref 70–99)
Potassium: 3.7 mEq/L (ref 3.5–5.1)
Sodium: 142 mEq/L (ref 135–145)

## 2021-04-05 LAB — LIPID PANEL
Cholesterol: 165 mg/dL (ref 0–200)
HDL: 57.5 mg/dL (ref >39.00)
LDL Cholesterol: 78 mg/dL (ref 0–99)
NonHDL: 107.31
Total CHOL/HDL Ratio: 3
Triglycerides: 145 mg/dL (ref 0.0–149.0)
VLDL: 29 mg/dL (ref 0.0–40.0)

## 2021-04-05 LAB — HEMOGLOBIN A1C: Hgb A1c MFr Bld: 5.7 % (ref 4.6–6.5)

## 2021-04-05 LAB — TSH: TSH: 1.78 u[IU]/mL (ref 0.35–5.50)

## 2021-04-05 MED ORDER — ALBUTEROL SULFATE HFA 108 (90 BASE) MCG/ACT IN AERS: 2.0000 | INHALATION_SPRAY | Freq: Four times a day (QID) | RESPIRATORY_TRACT | 11 refills | Status: DC | PRN

## 2021-04-05 MED ORDER — PANTOPRAZOLE SODIUM 40 MG PO TBEC: 40.0000 mg | DELAYED_RELEASE_TABLET | Freq: Every day | ORAL | 3 refills | Status: DC

## 2021-04-05 NOTE — Patient Instructions (Signed)
Ok to change the omeprazole 40 mg to the pantoprazole 40 mg per day  Please take all new medication as prescribed- the inhaler as well - both sent to local [facility] in case you want to start now  Please let us know if you would need any medications refilled to a mail In pharmacy  Please also consider taking OTC Nasacort daily for post nasal drip causing cough as well  Also consider taking OTC Pepcid 20 mg at bedtime if the reflux is not well enough controlled at night  Please call if you feel you would need medication for lower mood  Please call if you see any enlarging of swelling to the neck  Please continue all other medications as before, and refills have been done if requested.  Please have the pharmacy call with any other refills you may need.  Please continue your efforts at being more active, low cholesterol diet, and weight control.  You are otherwise up to date with prevention measures today.  Please keep your appointments with your specialists as you may have planned  Please go to the XRAY Department in the first floor for the x-ray testing  Please go to the LAB at the blood drawing area for the tests to be done  You will be contacted by phone if any changes need to be made immediately.  Otherwise, you will receive a letter about your results with an explanation, but please check with MyChart first.  Please remember to sign up for MyChart if you have not done so, as this will be important to you in the future with finding out test results, communicating by private email, and scheduling acute appointments online when needed.  Please make an Appointment to return in 6 months, or sooner if needed

## 2021-04-05 NOTE — Progress Notes (Signed)
Patient ID: Joshua Li, male   DOB: 09-05-1960, 61 y.o.   MRN: [id number]         Chief Complaint:: wellness exam and cough, gerd, dysphoria, left neck mass, allergies       HPI:  Joshua Li is a 61 y.o. male here for wellness exam; dclines pneumovax and covid booster, o/w up to date                        Also taking vit d.  And good compliance with statin. Pt denies chest pain, increased sob or doe, wheezing, orthopnea, PND, increased LE swelling, palpitations, dizziness or syncope.   Pt denies polydipsia, polyuria, or new focal neuro s/s.  Has had mile worsening depressive symptoms, but no suicidal ideation, or panic, declines tx for now such as SSRI.  Has had persistent cough for over 2-3 mo, with mild worsening alleriges, reflux and recent bronchitis symptoms.  Has been unaware of swelling mass like to the left submandibular area, but  Pt denies fever, wt loss, night sweats, loss of appetite, or other constitutional symptoms     Wt Readings from Last 3 Encounters:  04/05/21 238 lb (108 kg)  02/04/21 239 lb 2 oz (108.5 kg)  10/26/20 241 lb (109.3 kg)   BP Readings from Last 3 Encounters:  04/05/21 110/66  02/04/21 126/70  10/26/20 120/68   Immunization History  Administered Date(s) Administered   Influenza Split 12/26/2013   Influenza Whole 01/22/2007, 03/09/2009   Influenza,inj,Quad PF,6+ Mos 12/24/2015, 01/23/2017, 12/26/2019   Influenza-Unspecified 12/27/2014, 01/13/2018, 12/06/2020   Moderna Sars-Covid-2 Vaccination 06/07/2019, 07/05/2019   PFIZER(Purple Top)SARS-COV-2 Vaccination 01/25/2020, 08/13/2020, 12/10/2020   Tdap 06/29/2011   Zoster Recombinat (Shingrix) 08/13/2020, 12/06/2020   There are no preventive care reminders to display for this patient.     Past Medical History:  Diagnosis Date   Allergy    Asthma    GERD (gastroesophageal reflux disease)    Heart murmur    Hiatal hernia    Hyperlipidemia    Ulcerative colitis 2001   Status post total  colectomy with J-pouch in 2003 -    Past Surgical History:  Procedure Laterality Date   CHOLECYSTECTOMY     COLECTOMY  2003   with one step take-down to J-pouch   COLECTOMY     COLECTOMY  2003   total colectomy with J pouch (one step procedure)   COLECTOMY  2003   total colectomy with j pouch ( one step)   TONSILLECTOMY     TOTAL COLECTOMY  2003   total colectomy with J pouch     reports that he has never smoked. He has never used smokeless tobacco. He reports current alcohol use of about 2.0 standard drinks per week. He reports that he does not use drugs. family history includes Cancer in his mother; Heart attack (age of onset: 71) in his father; Lymphoma (age of onset: 29) in his brother; Stomach cancer (age of onset: 55) in his father. No Known Allergies Current Outpatient Medications on File Prior to Visit  Medication Sig Dispense Refill   aspirin 81 MG tablet Take 81 mg by mouth daily.     Cholecalciferol (VITAMIN D) 50 MCG (2000 UT) CAPS Take 2,000 Units by mouth daily.     flecainide (TAMBOCOR) 50 MG tablet Take 1 tablet (50 mg total) by mouth 2 (two) times daily. 180 tablet 3   loratadine (CLARITIN) 10 MG tablet Take 10 mg by  mouth daily.     propranolol (INDERAL) 10 MG tablet Take 1 tablet (10 mg total) by mouth 3 (three) times daily. 270 tablet 3   No current facility-administered medications on file prior to visit.        ROS:  All others reviewed and negative.  Objective        PE:  BP 110/66 (BP Location: Right Arm, Patient Position: Sitting, Cuff Size: Large)    Pulse (!) 49    Ht 6' 3"  (1.905 m)    Wt 238 lb (108 kg)    SpO2 96%    BMI 29.75 kg/m                 Constitutional: Pt appears in NAD               HENT: Head: NCAT.                Right Ear: External ear normal.                 Left Ear: External ear normal.                Eyes: . Pupils are equal, round, and reactive to light. Conjunctivae and EOM are normal               Nose: without d/c or  deformity               Neck: Neck supple. Gross normal ROM; left submandibular area with estimated just over 1 mo relatively fixed firm vague mass nontender               Cardiovascular: Normal rate and regular rhythm.                 Pulmonary/Chest: Effort normal and breath sounds without rales or wheezing.                Abd:  Soft, NT, ND, + BS, no organomegaly               Neurological: Pt is alert. At baseline orientation, motor grossly intact               Skin: Skin is warm. No rashes, no other new lesions, LE edema - none               Psychiatric: Pt behavior is normal without agitation   Micro: none  Cardiac tracings I have personally interpreted today:  none  Pertinent Radiological findings (summarize): none   Lab Results  Component Value Date   WBC 7.5 04/05/2021   HGB 16.3 04/05/2021   HCT 48.0 04/05/2021   PLT 243.0 04/05/2021   GLUCOSE 101 (H) 04/05/2021   CHOL 165 04/05/2021   TRIG 145.0 04/05/2021   HDL 57.50 04/05/2021   LDLDIRECT 123.3 03/02/2009   LDLCALC 78 04/05/2021   ALT 20 04/05/2021   AST 15 04/05/2021   NA 142 04/05/2021   K 3.7 04/05/2021   CL 103 04/05/2021   CREATININE 1.17 04/05/2021   BUN 12 04/05/2021   CO2 30 04/05/2021   TSH 1.78 04/05/2021   PSA 1.04 04/05/2021   HGBA1C 5.7 04/05/2021   Assessment/Plan:  Joshua Li is a 61 y.o. White or Caucasian [1] male with  has a past medical history of Allergy, Asthma, GERD (gastroesophageal reflux disease), Heart murmur, Hiatal hernia, Hyperlipidemia, and Ulcerative colitis (2001).  Encounter for well adult exam with abnormal findings Age and sex appropriate  education and counseling updated with regular exercise and diet Referrals for preventative services - none needed Immunizations addressed - declines pneumovax, covid booster Smoking counseling  - none needed Evidence for depression or other mood disorder - mild recent onset, declines tx Most recent labs reviewed. I have personally  reviewed and have noted: 1) the patient's medical and social history 2) The patient's current medications and supplements 3) The patient's height, weight, and BMI have been recorded in the chart   Allergic rhinitis Mild recent worsening, for add nasacort asd  Bradycardia Chronic persistent stable, cont to follow  Cough variant asthma ? Mild worsening, for add albuterol mdi prn  GERD With recent mild worsening, for change prilosec to protonix 40 qd  Hyperglycemia Lab Results  Component Value Date   HGBA1C 5.7 04/05/2021   Stable, pt to continue current medical treatment  - diet   Hyperlipidemia Lab Results  Component Value Date   LDLCALC 78 04/05/2021   Stable, pt to continue current statin crestor 20   Neck mass ? LA vs other mass - for neck ct  Vitamin D deficiency Last vitamin D Lab Results  Component Value Date   VD25OH 33.27 04/05/2021   Ow, to start oral replacement  Followup: Return in about 6 months (around 10/03/2021).  Cathlean Cower, MD 04/10/2021 10:27 PM [facility]

## 2021-04-08 ENCOUNTER — Encounter: Payer: Self-pay | Admitting: Internal Medicine

## 2021-04-08 LAB — RESPIRATORY ALLERGY PROFILE REGION II ~~LOC~~
Allergen, A. alternata, m6: <0.1 kU/L
Allergen, Cedar tree, t12: 0.4 kU/L — ABNORMAL HIGH
Allergen, Comm Silver Birch, t9: 0.29 kU/L — ABNORMAL HIGH
Allergen, Cottonwood, t14: <0.1 kU/L
Allergen, D pternoyssinus,d7: <0.1 kU/L
Allergen, Mouse Urine Protein, e78: <0.1 kU/L
Allergen, Mulberry, t76: <0.1 kU/L
Allergen, Oak,t7: 0.41 kU/L — ABNORMAL HIGH
Allergen, P. notatum, m1: <0.1 kU/L
Aspergillus fumigatus, m3: <0.1 kU/L
Bermuda Grass: 0.3 kU/L — ABNORMAL HIGH
Box Elder IgE: <0.1 kU/L
CLADOSPORIUM HERBARUM (M2) IGE: <0.1 kU/L
COMMON RAGWEED (SHORT) (W1) IGE: <0.1 kU/L
Cat Dander: <0.1 kU/L
Class: 0
Class: 0
Class: 0
Class: 0
Class: 0
Class: 0
Class: 0
Class: 0
Class: 0
Class: 0
Class: 0
Class: 0
Class: 0
Class: 0
Class: 0
Class: 0
Class: 0
Class: 0
Class: 1
Class: 1
Class: 1
Cockroach: <0.1 kU/L
D. farinae: <0.1 kU/L
Dog Dander: <0.1 kU/L
Elm IgE: <0.1 kU/L
IgE (Immunoglobulin E), Serum: 10 kU/L (ref <=114)
Johnson Grass: 0.29 kU/L — ABNORMAL HIGH
Pecan/Hickory Tree IgE: <0.1 kU/L
Rough Pigweed  IgE: <0.1 kU/L
Sheep Sorrel IgE: <0.1 kU/L
Timothy Grass: 0.45 kU/L — ABNORMAL HIGH

## 2021-04-08 LAB — INTERPRETATION:

## 2021-04-08 MED ORDER — PANTOPRAZOLE SODIUM 40 MG PO TBEC: 40.0000 mg | DELAYED_RELEASE_TABLET | Freq: Every day | ORAL | 3 refills | Status: DC

## 2021-04-08 MED ORDER — ALBUTEROL SULFATE HFA 108 (90 BASE) MCG/ACT IN AERS: 2.0000 | INHALATION_SPRAY | Freq: Four times a day (QID) | RESPIRATORY_TRACT | 11 refills | Status: DC | PRN

## 2021-04-08 MED ORDER — OMEPRAZOLE 40 MG PO CPDR: 40.0000 mg | DELAYED_RELEASE_CAPSULE | Freq: Every day | ORAL | 3 refills | Status: DC

## 2021-04-08 MED ORDER — ROSUVASTATIN CALCIUM 20 MG PO TABS: 20.0000 mg | ORAL_TABLET | Freq: Every day | ORAL | 3 refills | Status: DC

## 2021-04-09 ENCOUNTER — Other Ambulatory Visit: Payer: Self-pay | Admitting: Internal Medicine

## 2021-04-10 ENCOUNTER — Encounter: Payer: Self-pay | Admitting: Internal Medicine

## 2021-04-10 NOTE — Assessment & Plan Note (Signed)
?   LA vs other mass - for neck ct

## 2021-04-10 NOTE — Assessment & Plan Note (Signed)
?   Mild worsening, for add albuterol mdi prn

## 2021-04-10 NOTE — Assessment & Plan Note (Signed)
Last vitamin D Lab Results  Component Value Date   VD25OH 33.27 04/05/2021   Ow, to start oral replacement

## 2021-04-10 NOTE — Assessment & Plan Note (Signed)
Lab Results  Component Value Date   LDLCALC 78 04/05/2021   Stable, pt to continue current statin crestor 20

## 2021-04-10 NOTE — Assessment & Plan Note (Signed)
Mild recent worsening, for add nasacort asd

## 2021-04-10 NOTE — Assessment & Plan Note (Signed)
With recent mild worsening, for change prilosec to protonix 40 qd

## 2021-04-10 NOTE — Assessment & Plan Note (Signed)
Lab Results  Component Value Date   HGBA1C 5.7 04/05/2021   Stable, pt to continue current medical treatment  - diet

## 2021-04-10 NOTE — Assessment & Plan Note (Signed)
Age and sex appropriate education and counseling updated with regular exercise and diet Referrals for preventative services - none needed Immunizations addressed - declines pneumovax, covid booster Smoking counseling  - none needed Evidence for depression or other mood disorder - mild recent onset, declines tx Most recent labs reviewed. I have personally reviewed and have noted: 1) the patient's medical and social history 2) The patient's current medications and supplements 3) The patient's height, weight, and BMI have been recorded in the chart

## 2021-04-10 NOTE — Assessment & Plan Note (Signed)
Chronic persistent stable, cont to follow

## 2021-04-14 ENCOUNTER — Other Ambulatory Visit: Payer: Self-pay | Admitting: Internal Medicine

## 2021-04-29 ENCOUNTER — Encounter: Payer: Self-pay | Admitting: Cardiology

## 2021-04-29 ENCOUNTER — Other Ambulatory Visit: Payer: Self-pay

## 2021-04-29 MED ORDER — FLECAINIDE ACETATE 50 MG PO TABS: 50.0000 mg | ORAL_TABLET | Freq: Two times a day (BID) | ORAL | 0 refills | Status: DC

## 2021-04-29 MED ORDER — PROPRANOLOL HCL 10 MG PO TABS: 10.0000 mg | ORAL_TABLET | Freq: Three times a day (TID) | ORAL | 0 refills | Status: DC

## 2021-07-08 ENCOUNTER — Encounter: Payer: Self-pay | Admitting: Family Medicine

## 2021-07-08 ENCOUNTER — Ambulatory Visit (INDEPENDENT_AMBULATORY_CARE_PROVIDER_SITE_OTHER): Payer: 59 | Admitting: Family Medicine

## 2021-07-08 VITALS — BP 124/76 | HR 50 | Temp 97.6°F | Ht 75.0 in | Wt 235.0 lb

## 2021-07-08 DIAGNOSIS — Z8669 Personal history of other diseases of the nervous system and sense organs: Secondary | ICD-10-CM

## 2021-07-08 DIAGNOSIS — H60502 Unspecified acute noninfective otitis externa, left ear: Secondary | ICD-10-CM

## 2021-07-08 MED ORDER — OFLOXACIN 0.3 % OT SOLN: 10.0000 [drp] | Freq: Every day | OTIC | 0 refills | Status: DC

## 2021-07-08 NOTE — Patient Instructions (Addendum)
Use the ear drops as prescribed. Avoid swimming while using this medication.  ? ?We are referring you to ENT.  ?

## 2021-07-08 NOTE — Progress Notes (Signed)
? ?Subjective:  ? ? Patient ID: Joshua Li, male    DOB: January 01, 1961, 61 y.o.   MRN: [id number] ? ?HPI ?Chief Complaint  ?Patient presents with  ? Referral  ?  Discuss ENT referral, last year right ear had fungus, now his left ear is having the same sx.  ? ?Complains of recurrent ear infection, this occurrence is his left ear. Complains of left ear fullness, pale yellow fluid x 1 week and now starting to have discomfort.  ? ?Denies fever, chills, headache, dizziness, URI symptoms.  ? ?Reports using OTC antifungal medication, clotrimazole, for the past week.  ? ?States he had a fungal infection in his right ear in 01/2021.  ?He saw Dr. Benjamine Mola at that time and now his practice is not in network.   ? ? ?Past Medical History:  ?Diagnosis Date  ? Allergy   ? Asthma   ? GERD (gastroesophageal reflux disease)   ? Heart murmur   ? Hiatal hernia   ? Hyperlipidemia   ? Ulcerative colitis 2001  ? Status post total colectomy with J-pouch in 2003 -   ? ?Current Outpatient Medications on File Prior to Visit  ?Medication Sig Dispense Refill  ? albuterol (VENTOLIN HFA) 108 (90 Base) MCG/ACT inhaler INHALE 2 PUFFS INTO THE LUNGS EVERY 6 (SIX) HOURS AS NEEDED FOR WHEEZING OR SHORTNESS OF BREATH. 24 g 3  ? aspirin 81 MG tablet Take 81 mg by mouth daily.    ? Cholecalciferol (VITAMIN D) 50 MCG (2000 UT) CAPS Take 2,000 Units by mouth daily.    ? flecainide (TAMBOCOR) 50 MG tablet Take 1 tablet (50 mg total) by mouth 2 (two) times daily. Call to schedule yearly appointment. 180 tablet 0  ? loratadine (CLARITIN) 10 MG tablet Take 10 mg by mouth daily.    ? pantoprazole (PROTONIX) 40 MG tablet Take 1 tablet (40 mg total) by mouth daily. 90 tablet 3  ? propranolol (INDERAL) 10 MG tablet Take 1 tablet (10 mg total) by mouth 3 (three) times daily. Call to schedule yearly appointment. 270 tablet 0  ? rosuvastatin (CRESTOR) 20 MG tablet Take 1 tablet (20 mg total) by mouth daily. Annual appt due in January pt must see provider for future  refills 90 tablet 3  ? ?No current facility-administered medications on file prior to visit.  ? ? ? ?Reviewed allergies, medications, past medical, surgical, family, and social history. ? ? ? ?Review of Systems ?Pertinent positives and negatives in the history of present illness. ? ?   ?Objective:  ? Physical Exam ?Constitutional:   ?   General: He is not in acute distress. ?   Appearance: Normal appearance. He is not ill-appearing.  ?HENT:  ?   Right Ear: Tympanic membrane, ear canal and external ear normal.  ?   Left Ear: Swelling present. There is no impacted cerumen.  ?   Ears:  ?   Comments: Mild erythema, edema or left ear canal with a yellowish clump of exudate in the canal. Visualized part of TM normal appearing.  ?   Nose: Nose normal.  ?Eyes:  ?   Conjunctiva/sclera: Conjunctivae normal.  ?Cardiovascular:  ?   Rate and Rhythm: Normal rate.  ?Pulmonary:  ?   Effort: Pulmonary effort is normal.  ?Neurological:  ?   Mental Status: He is alert.  ? ?BP 124/76 (BP Location: Left Arm, Patient Position: Sitting, Cuff Size: Large)   Pulse (!) 50   Temp 97.6 ?F (36.4 ?C) (Temporal)  Ht 6' 3"  (1.905 m)   Wt 235 lb (106.6 kg)   SpO2 96%   BMI 29.37 kg/m?  ? ? ? ? ?   ?Assessment & Plan:  ?Acute otitis externa of left ear, unspecified type - Plan: ofloxacin (FLOXIN OTIC) 0.3 % OTIC solution, Ambulatory referral to ENT ? ?History of recurrent ear infection - Plan: Ambulatory referral to ENT ? ?Drops prescribed, he used them last time for his right ear infection. No impaction on exam.  ?Avoid swimming. Referral to ENT. He will follow up if worsening.  ? ?

## 2021-07-12 ENCOUNTER — Encounter: Payer: Self-pay | Admitting: Cardiology

## 2021-07-12 ENCOUNTER — Ambulatory Visit: Payer: 59 | Admitting: Nurse Practitioner

## 2021-07-12 ENCOUNTER — Ambulatory Visit (INDEPENDENT_AMBULATORY_CARE_PROVIDER_SITE_OTHER): Payer: 59 | Admitting: Cardiology

## 2021-07-12 VITALS — BP 120/74 | HR 45 | Ht 75.0 in | Wt 239.6 lb

## 2021-07-12 DIAGNOSIS — I480 Paroxysmal atrial fibrillation: Secondary | ICD-10-CM

## 2021-07-12 MED ORDER — METOPROLOL SUCCINATE ER 50 MG PO TB24: 50.0000 mg | ORAL_TABLET | Freq: Every day | ORAL | 6 refills | Status: DC

## 2021-07-12 NOTE — Progress Notes (Signed)
? ?Electrophysiology Office Note ? ? ?Date:  07/12/2021  ? ?ID:  Joshua Li, DOB Jan 16, 1961, MRN [id number] ? ?PCP:  Biagio Borg, MD  ?Cardiologist:  Ellyn Hack ?Primary Electrophysiologist:  Jaelyne Deeg Meredith Leeds, MD   ? ?No chief complaint on file. ? ?  ?History of Present Illness: ?Joshua Li is a 61 y.o. male who is being seen today for the evaluation of palpitations at the request of Glenetta Hew. Presenting today for electrophysiology evaluation.   ? ?She was significant for hyperlipidemia.  He was seen in October 2018 was noted to have intermittent palpitations.  This been going on for many years.  A 30-day monitor showed multiple arrhythmias including PAT, PVCs, atrial flutter, atrial fibrillation, nonsustained VT.  He had an echo that showed a normal ejection fraction.  He is now on both flecainide and propranolol. ? ?Today, denies symptoms of palpitations, chest pain, shortness of breath, orthopnea, PND, lower extremity edema, claudication, dizziness, presyncope, syncope, bleeding, or neurologic sequela. The patient is tolerating medications without difficulties.  Since being seen he has done well.  He has had minimal palpitations since last being seen.  He has no chest pain and no shortness of breath.  He states that when he does have palpitations it is usually when he misses a dose of his medications. ? ? ?Past Medical History:  ?Diagnosis Date  ? Allergy   ? Asthma   ? GERD (gastroesophageal reflux disease)   ? Heart murmur   ? Hiatal hernia   ? Hyperlipidemia   ? Ulcerative colitis 2001  ? Status post total colectomy with J-pouch in 2003 -   ? ?Past Surgical History:  ?Procedure Laterality Date  ? CHOLECYSTECTOMY    ? COLECTOMY  2003  ? with one step take-down to J-pouch  ? COLECTOMY    ? COLECTOMY  2003  ? total colectomy with J pouch (one step procedure)  ? COLECTOMY  2003  ? total colectomy with j pouch ( one step)  ? TONSILLECTOMY    ? TOTAL COLECTOMY  2003  ? total colectomy with J pouch    ? ? ? ?Current Outpatient Medications  ?Medication Sig Dispense Refill  ? albuterol (VENTOLIN HFA) 108 (90 Base) MCG/ACT inhaler INHALE 2 PUFFS INTO THE LUNGS EVERY 6 (SIX) HOURS AS NEEDED FOR WHEEZING OR SHORTNESS OF BREATH. 24 g 3  ? aspirin 81 MG tablet Take 81 mg by mouth daily.    ? cetirizine (ZYRTEC) 10 MG tablet Take 10 mg by mouth daily.    ? Cholecalciferol (VITAMIN D) 50 MCG (2000 UT) CAPS Take 2,000 Units by mouth daily.    ? flecainide (TAMBOCOR) 50 MG tablet Take 1 tablet (50 mg total) by mouth 2 (two) times daily. Call to schedule yearly appointment. 180 tablet 0  ? ofloxacin (FLOXIN OTIC) 0.3 % OTIC solution Place 10 drops into the right ear daily. Use for 7 days 5 mL 0  ? pantoprazole (PROTONIX) 40 MG tablet Take 1 tablet (40 mg total) by mouth daily. 90 tablet 3  ? propranolol (INDERAL) 10 MG tablet Take 1 tablet (10 mg total) by mouth 3 (three) times daily. Call to schedule yearly appointment. 270 tablet 0  ? rosuvastatin (CRESTOR) 20 MG tablet Take 1 tablet (20 mg total) by mouth daily. Annual appt due in January pt must see provider for future refills 90 tablet 3  ? ?No current facility-administered medications for this visit.  ? ? ?Allergies:   Patient has no known  allergies.  ? ?Social History:  The patient  reports that he has never smoked. He has never used smokeless tobacco. He reports current alcohol use of about 2.0 standard drinks per week. He reports that he does not use drugs.  ? ?Family History:  The patient's family history includes Cancer in his mother; Heart attack (age of onset: 52) in his father; Lymphoma (age of onset: 17) in his brother; Stomach cancer (age of onset: 2) in his father.  ? ?ROS:  Please see the history of present illness.   Otherwise, review of systems is positive for none.   All other systems are reviewed and negative.  ? ?PHYSICAL EXAM: ?VS:  BP 120/74   Pulse (!) 45   Ht 6' 3"  (1.905 m)   Wt 239 lb 9.6 oz (108.7 kg)   SpO2 97%   BMI 29.95 kg/m?  , BMI  Body mass index is 29.95 kg/m?. ?GEN: Well nourished, well developed, in no acute distress  ?HEENT: normal  ?Neck: no JVD, carotid bruits, or masses ?Cardiac: RRR; no murmurs, rubs, or gallops,no edema  ?Respiratory:  clear to auscultation bilaterally, normal work of breathing ?GI: soft, nontender, nondistended, + BS ?MS: no deformity or atrophy  ?Skin: warm and dry ?Neuro:  Strength and sensation are intact ?Psych: euthymic mood, full affect ? ?EKG:  EKG is ordered today. ?Personal review of the ekg ordered shows sinus rhythm, rate 45  ? ?Recent Labs: ?04/05/2021: ALT 20; BUN 12; Creatinine, Ser 1.17; Hemoglobin 16.3; Platelets 243.0; Potassium 3.7; Sodium 142; TSH 1.78  ? ? ?Lipid Panel  ?   ?Component Value Date/Time  ? CHOL 165 04/05/2021 0939  ? TRIG 145.0 04/05/2021 0939  ? TRIG 74 04/10/2006 0808  ? HDL 57.50 04/05/2021 0939  ? CHOLHDL 3 04/05/2021 0939  ? VLDL 29.0 04/05/2021 0939  ? [place] 78 04/05/2021 0939  ? LDLDIRECT 123.3 03/02/2009 0832  ? ? ? ?Wt Readings from Last 3 Encounters:  ?07/12/21 239 lb 9.6 oz (108.7 kg)  ?07/08/21 235 lb (106.6 kg)  ?04/05/21 238 lb (108 kg)  ?  ? ? ?Other studies Reviewed: ?Additional studies/ records that were reviewed today include: TTE 03/16/17  ?Review of the above records today demonstrates:  ?- Left ventricle: The cavity size was normal. Systolic function was ?  normal. The estimated ejection fraction was in the range of 60% ?  to 65%. Wall motion was normal; there were no regional wall ?  motion abnormalities. ?- Atrial septum: No defect or patent foramen ovale was identified. ?- Left atrium:  The atrium was normal in size. ? ?Myoview 05/24/17 ?There was no ST segment deviation noted during stress. AFIB. ?This is a low risk study. No perfusion defects. No ischemia. ? ?ETT 06/15/17 - personally reviewed ?Blood pressure demonstrated a normal response to exercise. ?Upsloping ST segment depression ST segment depression of 1 mm was noted during stress in the II, III, aVF,  V5 and V6 leads. ?  ?Positive ETT ?1 mm upsloping ST segment depression in inferior lateral leads with stress ?Normal hemodynamic response ? ?ASSESSMENT AND PLAN: ? ?1.  Nonsustained VT: Had 5-20 beats on cardiac monitoring.  Ejection fraction is normal with low risk Myoview.  No further episodes on flecainide.  No changes at this time. ? ?2.  SVT: No further episodes since starting flecainide. ? ?3.  Paroxysmal atrial fibrillation/flutter: Currently on flecainide 50 mg twice daily, propranolol 10 mg 3 times daily.  CHA2DS2-VASc of 0.  High risk medication monitoring  for flecainide via ECG today.  He would like to get off of his 3 times a day propranolol.  We Shaneice Barsanti stop propranolol and start Toprol-XL 50 mg. ? ?Current medicines are reviewed at length with the patient today.   ?The patient does not have concerns regarding his medicines.  The following changes were made today: Start Toprol-XL ? ?Labs/ tests ordered today include:  ?Orders Placed This Encounter  ?Procedures  ? EKG 12-Lead  ? ? ?Disposition:   FU 12 months ? ?Signed, ?Stefani Baik Meredith Leeds, MD  ?07/12/2021 11:39 AM    ? ?[facility] ?[address] ?[address] ?[place] [address] ?[phone] (office) ?[phone] (fax) ?

## 2021-07-12 NOTE — Patient Instructions (Addendum)
Medication Instructions:  ?Your physician has recommended you make the following change in your medication:  ?STOP Propranolol ?START Toprol (Metoprolol Succinate) 50 mg daily at bedtime ? ?*If you need a refill on your cardiac medications before your next appointment, please call your pharmacy* ? ? ?Lab Work: ?None ordered ? ? ?Testing/Procedures: ?None ordered ? ? ?Follow-Up: ?At [facility], you and your health needs are our priority.  As part of our continuing mission to provide you with exceptional heart care, we have created designated Provider Care Teams.  These Care Teams include your primary Cardiologist (physician) and Advanced Practice Providers (APPs -  Physician Assistants and Nurse Practitioners) who all work together to provide you with the care you need, when you need it. ? ?Your next appointment:   ?1 year(s) ? ?The format for your next appointment:   ?In Person ? ?Provider:   ?You will see one of the following Advanced Practice Providers on your designated Care Team:   ?Tommye Standard, PA-C ?Legrand Como "Jonni Sanger" [place], PA-C ? ? ? ?Thank you for choosing [facility]!! ? ? ?Trinidad Curet, RN ?[phone] ? ?Other Instructions ? ?Metoprolol Extended-Release Tablets ?What is this medication? ?METOPROLOL (me TOE proe lole) treats high blood pressure and heart failure. It may also be used to prevent chest pain (angina). It works by lowering your blood pressure and heart rate, making it easier for your heart to pump blood to the rest of your body. It belongs to a group of medications called beta blockers. ?This medicine may be used for other purposes; ask your health care provider or pharmacist if you have questions. ?COMMON BRAND NAME(S): toprol, Toprol XL ?What should I tell my care team before I take this medication? ?They need to know if you have any of these conditions: ?Diabetes ?Heart or vessel disease like slow heart rate, worsening heart failure, heart block, sick sinus syndrome, or Raynaud's  disease ?Kidney disease ?Liver disease ?Lung or breathing disease, like asthma or emphysema ?Pheochromocytoma ?Thyroid disease ?An unusual or allergic reaction to metoprolol, other beta blockers, medications, foods, dyes, or preservatives ?Pregnant or trying to get pregnant ?Breast-feeding ?How should I use this medication? ?Take this medication by mouth. Take it as directed on the prescription label at the same time every day. Take it with food. You may cut the tablet in half if it is scored (has a line in the middle of it). This may help you swallow the tablet if the whole tablet is too big. Be sure to take both halves. Do not take just one-half of the tablet. Keep taking it unless your care team tells you to stop. ?Talk to your care team about the use of this medication in children. While it may be prescribed for children as young as 6 years for selected conditions, precautions do apply. ?Overdosage: If you think you have taken too much of this medicine contact a poison control center or emergency room at once. ?NOTE: This medicine is only for you. Do not share this medicine with others. ?What if I miss a dose? ?If you miss a dose, take it as soon as you can. If it is almost time for your next dose, take only that dose. Do not take double or extra doses. ?What may interact with this medication? ?This medication may interact with the following: ?Certain medications for blood pressure, heart disease, irregular heartbeat ?Certain medications for depression, like monoamine oxidase (MAO) inhibitors, fluoxetine, or paroxetine ?Clonidine ?Dobutamine ?Epinephrine ?Isoproterenol ?Reserpine ?This list may  not describe all possible interactions. Give your health care provider a list of all the medicines, herbs, non-prescription drugs, or dietary supplements you use. Also tell them if you smoke, drink alcohol, or use illegal drugs. Some items may interact with your medicine. ?What should I watch for while using this  medication? ?Visit your care team for regular checks on your progress. Check your blood pressure as directed. Ask your care team what your blood pressure should be. Also, find out when you should contact them. ?Do not treat yourself for coughs, colds, or pain while you are using this medication without asking your care team for advice. Some medications may increase your blood pressure. ?You may get drowsy or dizzy. Do not drive, use machinery, or do anything that needs mental alertness until you know how this medication affects you. Do not stand up or sit up quickly, especially if you are an older patient. This reduces the risk of dizzy or fainting spells. Alcohol may interfere with the effect of this medication. Avoid alcoholic drinks. ?This medication may increase blood sugar. Ask your care team if changes in diet or medications are needed if you have diabetes. ?What side effects may I notice from receiving this medication? ?Side effects that you should report to your care team as soon as possible: ?Allergic reactions--skin rash, itching, hives, swelling of the face, lips, tongue, or throat ?Heart failure--shortness of breath, swelling of the ankles, feet, or hands, sudden weight gain, unusual weakness or fatigue ?Low blood pressure--dizziness, feeling faint or lightheaded, blurry vision ?Raynaud's--cool, numb, or painful fingers or toes that may change color from pale, to blue, to red ?Slow heartbeat--dizziness, feeling faint or lightheaded, confusion, trouble breathing, unusual weakness or fatigue ?Worsening mood, feelings of depression ?Side effects that usually do not require medical attention (report to your care team if they continue or are bothersome): ?Change in sex drive or performance ?Diarrhea ?Dizziness ?Fatigue ?Headache ?This list may not describe all possible side effects. Call your doctor for medical advice about side effects. You may report side effects to FDA at 1-800-FDA-1088. ?Where should I  keep my medication? ?Keep out of the reach of children and pets. ?Store at room temperature between 20 and 25 degrees C (68 and 77 degrees F). Throw away any unused medication after the expiration date. ?NOTE: This sheet is a summary. It may not cover all possible information. If you have questions about this medicine, talk to your doctor, pharmacist, or health care provider. ?? 2023 Elsevier/[employer] Standard (2021-02-12 00:00:00) ? ?

## 2021-08-18 ENCOUNTER — Other Ambulatory Visit: Payer: Self-pay | Admitting: Cardiology

## 2021-09-09 DIAGNOSIS — B369 Superficial mycosis, unspecified: Secondary | ICD-10-CM | POA: Insufficient documentation

## 2021-09-09 DIAGNOSIS — H6240 Otitis externa in other diseases classified elsewhere, unspecified ear: Secondary | ICD-10-CM | POA: Diagnosis not present

## 2021-10-05 ENCOUNTER — Ambulatory Visit: Payer: 59 | Admitting: Internal Medicine

## 2021-10-13 ENCOUNTER — Encounter: Payer: Self-pay | Admitting: Internal Medicine

## 2021-10-13 ENCOUNTER — Ambulatory Visit (INDEPENDENT_AMBULATORY_CARE_PROVIDER_SITE_OTHER): Payer: 59 | Admitting: Internal Medicine

## 2021-10-13 VITALS — BP 118/60 | HR 46 | Temp 98.7°F | Ht 75.0 in | Wt 239.0 lb

## 2021-10-13 DIAGNOSIS — R001 Bradycardia, unspecified: Secondary | ICD-10-CM

## 2021-10-13 DIAGNOSIS — R739 Hyperglycemia, unspecified: Secondary | ICD-10-CM

## 2021-10-13 DIAGNOSIS — E7800 Pure hypercholesterolemia, unspecified: Secondary | ICD-10-CM

## 2021-10-13 DIAGNOSIS — R221 Localized swelling, mass and lump, neck: Secondary | ICD-10-CM | POA: Diagnosis not present

## 2021-10-13 DIAGNOSIS — E538 Deficiency of other specified B group vitamins: Secondary | ICD-10-CM | POA: Diagnosis not present

## 2021-10-13 DIAGNOSIS — E559 Vitamin D deficiency, unspecified: Secondary | ICD-10-CM

## 2021-10-13 DIAGNOSIS — Z125 Encounter for screening for malignant neoplasm of prostate: Secondary | ICD-10-CM

## 2021-10-13 NOTE — Progress Notes (Signed)
Patient ID: Joshua Li, male   DOB: 11-21-1960, 61 y.o.   MRN: [id number]        Chief Complaint: follow up neck LA, bradycardia, low vit d       HPI:  Joshua Li is a 61 y.o. male here to f/u last visit with neck LA,  Pt denies fever, wt loss, night sweats, loss of appetite, or other constitutional symptoms  Pt states no worsening neck mass he is aware.  Pt denies chest pain, increased sob or doe, wheezing, orthopnea, PND, increased LE swelling, palpitations, dizziness or syncope.   Pt denies polydipsia, polyuria, or new focal neuro s/s.         Wt Readings from Last 3 Encounters:  10/13/21 239 lb (108.4 kg)  07/12/21 239 lb 9.6 oz (108.7 kg)  07/08/21 235 lb (106.6 kg)   BP Readings from Last 3 Encounters:  10/13/21 118/60  07/12/21 120/74  07/08/21 124/76         Past Medical History:  Diagnosis Date   Allergy    Asthma    GERD (gastroesophageal reflux disease)    Heart murmur    Hiatal hernia    Hyperlipidemia    Ulcerative colitis 2001   Status post total colectomy with J-pouch in 2003 -    Past Surgical History:  Procedure Laterality Date   CHOLECYSTECTOMY     COLECTOMY  2003   with one step take-down to J-pouch   COLECTOMY     COLECTOMY  2003   total colectomy with J pouch (one step procedure)   COLECTOMY  2003   total colectomy with j pouch ( one step)   TONSILLECTOMY     TOTAL COLECTOMY  2003   total colectomy with J pouch     reports that he has never smoked. He has never used smokeless tobacco. He reports current alcohol use of about 2.0 standard drinks of alcohol per week. He reports that he does not use drugs. family history includes Cancer in his mother; Heart attack (age of onset: 74) in his father; Lymphoma (age of onset: 35) in his brother; Stomach cancer (age of onset: 58) in his father. No Known Allergies Current Outpatient Medications on File Prior to Visit  Medication Sig Dispense Refill   albuterol (VENTOLIN HFA) 108 (90 Base) MCG/ACT inhaler  INHALE 2 PUFFS INTO THE LUNGS EVERY 6 (SIX) HOURS AS NEEDED FOR WHEEZING OR SHORTNESS OF BREATH. 24 g 3   aspirin 81 MG tablet Take 81 mg by mouth daily.     cetirizine (ZYRTEC) 10 MG tablet Take 10 mg by mouth daily.     Cholecalciferol (VITAMIN D) 50 MCG (2000 UT) CAPS Take 2,000 Units by mouth daily.     flecainide (TAMBOCOR) 50 MG tablet Take 1 tablet (50 mg total) by mouth 2 (two) times daily. 180 tablet 3   metoprolol succinate (TOPROL-XL) 50 MG 24 hr tablet Take 1 tablet (50 mg total) by mouth at bedtime. Take with or immediately following a meal. 30 tablet 6   pantoprazole (PROTONIX) 40 MG tablet Take 1 tablet (40 mg total) by mouth daily. 90 tablet 3   rosuvastatin (CRESTOR) 20 MG tablet Take 1 tablet (20 mg total) by mouth daily. Annual appt due in January pt must see provider for future refills 90 tablet 3   No current facility-administered medications on file prior to visit.        ROS:  All others reviewed and negative.  Objective  PE:  BP 118/60 (BP Location: Right Arm, Patient Position: Sitting, Cuff Size: Large)   Pulse (!) 46   Temp 98.7 F (37.1 C) (Oral)   Ht 6' 3"  (1.905 m)   Wt 239 lb (108.4 kg)   SpO2 96%   BMI 29.87 kg/m                 Constitutional: Pt appears in NAD               HENT: Head: NCAT.                Right Ear: External ear normal.                 Left Ear: External ear normal.                Eyes: . Pupils are equal, round, and reactive to light. Conjunctivae and EOM are normal               Nose: without d/c or deformity               Neck: Neck supple. Gross normal ROM, has some few minor very small mobile lymph nodes at the right angle of jaw only               Cardiovascular: Normal rate and regular rhythm.                 Pulmonary/Chest: Effort normal and breath sounds without rales or wheezing.                               Neurological: Pt is alert. At baseline orientation, motor grossly intact               Skin: Skin is warm.  No rashes, no other new lesions, LE edema - none               Psychiatric: Pt behavior is normal without agitation   Micro: none  Cardiac tracings I have personally interpreted today:  none  Pertinent Radiological findings (summarize): none   Lab Results  Component Value Date   WBC 7.5 04/05/2021   HGB 16.3 04/05/2021   HCT 48.0 04/05/2021   PLT 243.0 04/05/2021   GLUCOSE 101 (H) 04/05/2021   CHOL 165 04/05/2021   TRIG 145.0 04/05/2021   HDL 57.50 04/05/2021   LDLDIRECT 123.3 03/02/2009   LDLCALC 78 04/05/2021   ALT 20 04/05/2021   AST 15 04/05/2021   NA 142 04/05/2021   K 3.7 04/05/2021   CL 103 04/05/2021   CREATININE 1.17 04/05/2021   BUN 12 04/05/2021   CO2 30 04/05/2021   TSH 1.78 04/05/2021   PSA 1.04 04/05/2021   HGBA1C 5.7 04/05/2021   Assessment/Plan:  Joshua Li is a 61 y.o. White or Caucasian [1] male with  has a past medical history of Allergy, Asthma, GERD (gastroesophageal reflux disease), Heart murmur, Hiatal hernia, Hyperlipidemia, and Ulcerative colitis (2001).  Vitamin D deficiency Last vitamin D Lab Results  Component Value Date   VD25OH 33.27 04/05/2021   Low, to start oral replacement   Neck mass Essentially resolved, no worsening LA noted on exam, jan 2023 cxr neg,  No constitutional symptoms,  to f/u any worsening symptoms or concerns  Bradycardia Asympt, cont currrent med tx tambocor 50 bid, toprol xl 50 qd,,  to f/u any worsening symptoms or concerns  Followup:  Return in about 6 months (around 04/15/2022).  Cathlean Cower, MD 10/16/2021 6:54 PM [facility]

## 2021-10-13 NOTE — Patient Instructions (Signed)
Please continue all other medications as before, and refills have been done if requested.  Please have the pharmacy call with any other refills you may need.  Please continue your efforts at being more active, low cholesterol diet, and weight control.  Please keep your appointments with your specialists as you may have planned  Please make an Appointment to return in 6 months, or sooner if needed, also with Lab Appointment for testing done 3-5 days before at the [facility] (so this is for TWO appointments - please see the scheduling desk as you leave)

## 2021-10-16 ENCOUNTER — Encounter: Payer: Self-pay | Admitting: Internal Medicine

## 2021-10-16 NOTE — Assessment & Plan Note (Signed)
Essentially resolved, no worsening LA noted on exam, jan 2023 cxr neg,  No constitutional symptoms,  to f/u any worsening symptoms or concerns

## 2021-10-16 NOTE — Assessment & Plan Note (Signed)
Last vitamin D Lab Results  Component Value Date   VD25OH 33.27 04/05/2021   Low, to start oral replacement

## 2021-10-16 NOTE — Assessment & Plan Note (Signed)
Asympt, cont currrent med tx tambocor 50 bid, toprol xl 50 qd,,  to f/u any worsening symptoms or concerns

## 2022-02-21 ENCOUNTER — Telehealth: Payer: Self-pay | Admitting: Internal Medicine

## 2022-02-21 NOTE — Telephone Encounter (Signed)
Patient states he would like to transfer care from Dr. Jeneen Rinks to Dr. Cherlynn Kaiser. Is this okay with you?

## 2022-02-21 NOTE — Telephone Encounter (Signed)
FYI

## 2022-02-21 NOTE — Telephone Encounter (Signed)
Ok with me 

## 2022-02-25 ENCOUNTER — Other Ambulatory Visit: Payer: Self-pay | Admitting: Cardiology

## 2022-03-03 ENCOUNTER — Encounter: Payer: Self-pay | Admitting: Family Medicine

## 2022-03-03 ENCOUNTER — Ambulatory Visit (INDEPENDENT_AMBULATORY_CARE_PROVIDER_SITE_OTHER): Payer: 59 | Admitting: Family Medicine

## 2022-03-03 VITALS — BP 112/60 | HR 50 | Temp 97.6°F | Ht 75.0 in | Wt 243.0 lb

## 2022-03-03 DIAGNOSIS — J45991 Cough variant asthma: Secondary | ICD-10-CM | POA: Diagnosis not present

## 2022-03-03 DIAGNOSIS — Z6831 Body mass index (BMI) 31.0-31.9, adult: Secondary | ICD-10-CM | POA: Diagnosis not present

## 2022-03-03 DIAGNOSIS — E6609 Other obesity due to excess calories: Secondary | ICD-10-CM | POA: Diagnosis not present

## 2022-03-03 DIAGNOSIS — K5100 Ulcerative (chronic) pancolitis without complications: Secondary | ICD-10-CM | POA: Diagnosis not present

## 2022-03-03 DIAGNOSIS — I480 Paroxysmal atrial fibrillation: Secondary | ICD-10-CM

## 2022-03-03 DIAGNOSIS — K219 Gastro-esophageal reflux disease without esophagitis: Secondary | ICD-10-CM | POA: Diagnosis not present

## 2022-03-03 DIAGNOSIS — E7800 Pure hypercholesterolemia, unspecified: Secondary | ICD-10-CM | POA: Diagnosis not present

## 2022-03-03 DIAGNOSIS — K5190 Ulcerative colitis, unspecified, without complications: Secondary | ICD-10-CM | POA: Insufficient documentation

## 2022-03-03 NOTE — Patient Instructions (Signed)
It was very nice to see you today!  Merry Christmas!  Referral placed for nutritionist   PLEASE NOTE:  If you had any lab tests please let us know if you have not heard back within a few days. You may see your results on MyChart before we have a chance to review them but we will give you a call once they are reviewed by [place]. If we ordered any referrals today, please let us know if you have not heard from their office within the next week.   Please try these tips to maintain a healthy lifestyle:  Eat most of your calories during the day when you are active. Eliminate processed foods including packaged sweets (pies, cakes, cookies), reduce intake of potatoes, white bread, white pasta, and white rice. Look for whole grain options, oat flour or almond flour.  Each meal should contain half fruits/vegetables, one quarter protein, and one quarter carbs (no bigger than a computer mouse).  Cut down on sweet beverages. This includes juice, soda, and sweet tea. Also watch fruit intake, though this is a healthier sweet option, it still contains natural sugar! Limit to 3 servings daily.  Drink at least 1 glass of water with each meal and aim for at least 8 glasses per day  Exercise at least 150 minutes every week.

## 2022-03-03 NOTE — Progress Notes (Signed)
Subjective:     Patient ID: Joshua Li, male    DOB: [birth year], 61 y.o.   MRN: [id number]  Chief Complaint  Patient presents with   Transfer of Care    Transfer of Care Not fasting      HPI-toc  A fib-seeing Dr. Melford Aase ASA, flecinide, metoprolol-controlled HLD-on Crestor GERD-on pantoprazole 65m-doing well. No dysphagia. Asthma-usu Spring/Fall.  Using albuterol more past 1 wk. More wheeze and cough lately.  Has gained 20# past few yrs.  Sedentary job, no exercise, eats whatever wants do.     Health Maintenance Due  Topic Date Due   DTaP/Tdap/Td (2 - Td or Tdap) 06/28/2021    Past Medical History:  Diagnosis Date   A-fib (HCC)    Allergy    Asthma    GERD (gastroesophageal reflux disease)    Heart murmur    Hiatal hernia    Hyperlipidemia    Ulcerative colitis 2001   Status post total colectomy with J-pouch in 2003 -     Past Surgical History:  Procedure Laterality Date   CHOLECYSTECTOMY     pt not sure   COLECTOMY  2003   with one step take-down to J-pouch   COLECTOMY     COLECTOMY  2003   total colectomy with J pouch (one step procedure)   COLECTOMY  2003   total colectomy with j pouch ( one step)   TONSILLECTOMY     TOTAL COLECTOMY  2003   total colectomy with J pouch     Outpatient Medications Prior to Visit  Medication Sig Dispense Refill   albuterol (VENTOLIN HFA) 108 (90 Base) MCG/ACT inhaler INHALE 2 PUFFS INTO THE LUNGS EVERY 6 (SIX) HOURS AS NEEDED FOR WHEEZING OR SHORTNESS OF BREATH. 24 g 3   aspirin 81 MG tablet Take 81 mg by mouth daily.     cetirizine (ZYRTEC) 10 MG tablet Take 10 mg by mouth daily.     Cholecalciferol (VITAMIN D) 50 MCG (2000 UT) CAPS Take 2,000 Units by mouth daily.     flecainide (TAMBOCOR) 50 MG tablet Take 1 tablet (50 mg total) by mouth 2 (two) times daily. 180 tablet 3   metoprolol succinate (TOPROL-XL) 50 MG 24 hr tablet Take 1 tablet (50 mg total) by mouth daily. 30 tablet 3   pantoprazole (PROTONIX) 40  MG tablet Take 1 tablet (40 mg total) by mouth daily. 90 tablet 3   rosuvastatin (CRESTOR) 20 MG tablet Take 1 tablet (20 mg total) by mouth daily. Annual appt due in January pt must see provider for future refills 90 tablet 3   No facility-administered medications prior to visit.    No Known Allergies ROS neg/noncontributory except as noted HPI/below  Intermitt LBP.  Slower urination-no nocturia-not a major problem now Lack of motivation. But not depressed       Objective:     BP 112/60   Pulse (!) 50   Temp 97.6 F (36.4 C) (Temporal)   Ht 6' 3"  (1.905 m)   Wt 243 lb (110.2 kg)   SpO2 96%   BMI 30.37 kg/m  Wt Readings from Last 3 Encounters:  03/03/22 243 lb (110.2 kg)  10/13/21 239 lb (108.4 kg)  07/12/21 239 lb 9.6 oz (108.7 kg)    Physical Exam   Gen: WDWN NAD owm HEENT: NCAT, conjunctiva not injected, sclera nonicteric NECK:  supple, no thyromegaly, no nodes, no carotid bruits CARDIAC: brady RRR, S1S2+, no murmur. DP 2+B LUNGS: CTAB. No  wheezes ABDOMEN:  BS+, soft, NTND, No HSM, no masses EXT:  no edema MSK: no gross abnormalities.  NEURO: A&O x3.  CN II-XII intact.  PSYCH: normal mood. Good eye contact     Assessment & Plan:   Problem List Items Addressed This Visit       Cardiovascular and Mediastinum   Paroxysmal atrial fibrillation (HCC) (Chronic)     Respiratory   Cough variant asthma     Digestive   GERD   Ulcerative pancolitis without complication ([place]) - Primary   Relevant Orders   Amb ref to Medical Nutrition Therapy-MNT     Other   Hyperlipidemia   Other Visit Diagnoses     Class 1 obesity due to excess calories with serious comorbidity and body mass index (BMI) of 31.0 to 31.9 in adult         1.  Ulcerative colitis-chronic.  Status post total colectomy.  Patient is working on trying to figure out what he can and cannot eat.  Also having some problems with overweight.  Will refer to nutritionist 2.  PAF-chronic.   Well-controlled on Tambocor and metoprolol and aspirin.  Managed by cardiology. 3.  Hyperlipidemia-chronic.  Controlled on rosuvastatin 20 mg.  Continue 4.  GERD-chronic.  Well-controlled on Protonix 40 mg daily.  Continue 5.  Cough variant asthma-exacerbated more seasonally.  He is having a bit of symptoms the past week.  Using albuterol, but controlled. 6.-Obesity with chronic medical problems-refer to nutritionist as he has complex dietary needs due to total colectomy.  Needs to incorporate exercise into his daily routine.  Work on [employer].  Follow-up 1 to 2 months for annual physical with labs  No orders of the defined types were placed in this encounter.   Wellington Hampshire, MD

## 2022-04-11 ENCOUNTER — Encounter: Payer: Self-pay | Admitting: Cardiology

## 2022-04-11 ENCOUNTER — Ambulatory Visit (INDEPENDENT_AMBULATORY_CARE_PROVIDER_SITE_OTHER): Payer: 59 | Admitting: Family Medicine

## 2022-04-11 ENCOUNTER — Encounter: Payer: Self-pay | Admitting: Family Medicine

## 2022-04-11 VITALS — BP 118/70 | HR 40 | Temp 98.2°F | Ht 75.0 in | Wt 235.0 lb

## 2022-04-11 DIAGNOSIS — Z0000 Encounter for general adult medical examination without abnormal findings: Secondary | ICD-10-CM | POA: Diagnosis not present

## 2022-04-11 DIAGNOSIS — I480 Paroxysmal atrial fibrillation: Secondary | ICD-10-CM | POA: Diagnosis not present

## 2022-04-11 DIAGNOSIS — K5100 Ulcerative (chronic) pancolitis without complications: Secondary | ICD-10-CM

## 2022-04-11 DIAGNOSIS — Z125 Encounter for screening for malignant neoplasm of prostate: Secondary | ICD-10-CM | POA: Diagnosis not present

## 2022-04-11 DIAGNOSIS — Z23 Encounter for immunization: Secondary | ICD-10-CM | POA: Diagnosis not present

## 2022-04-11 LAB — CBC WITH DIFFERENTIAL/PLATELET
Basophils Absolute: 0.1 10*3/uL (ref 0.0–0.1)
Basophils Relative: 0.6 % (ref 0.0–3.0)
Eosinophils Absolute: 0.1 10*3/uL (ref 0.0–0.7)
Eosinophils Relative: 1.6 % (ref 0.0–5.0)
HCT: 47.8 % (ref 39.0–52.0)
Hemoglobin: 16.5 g/dL (ref 13.0–17.0)
Lymphocytes Relative: 40.6 % (ref 12.0–46.0)
Lymphs Abs: 3.5 10*3/uL (ref 0.7–4.0)
MCHC: 34.6 g/dL (ref 30.0–36.0)
MCV: 86.8 fl (ref 78.0–100.0)
Monocytes Absolute: 0.5 10*3/uL (ref 0.1–1.0)
Monocytes Relative: 5.9 % (ref 3.0–12.0)
Neutro Abs: 4.5 10*3/uL (ref 1.4–7.7)
Neutrophils Relative %: 51.3 % (ref 43.0–77.0)
Platelets: 240 10*3/uL (ref 150.0–400.0)
RBC: 5.51 Mil/uL (ref 4.22–5.81)
RDW: 13.4 % (ref 11.5–15.5)
WBC: 8.7 10*3/uL (ref 4.0–10.5)

## 2022-04-11 LAB — PSA: PSA: 0.91 ng/mL (ref 0.10–4.00)

## 2022-04-11 LAB — LIPID PANEL
Cholesterol: 155 mg/dL (ref 0–200)
HDL: 54.2 mg/dL (ref >39.00)
LDL Cholesterol: 74 mg/dL (ref 0–99)
NonHDL: 101.29
Total CHOL/HDL Ratio: 3
Triglycerides: 136 mg/dL (ref 0.0–149.0)
VLDL: 27.2 mg/dL (ref 0.0–40.0)

## 2022-04-11 LAB — COMPREHENSIVE METABOLIC PANEL
ALT: 19 U/L (ref 0–53)
AST: 16 U/L (ref 0–37)
Albumin: 4.2 g/dL (ref 3.5–5.2)
Alkaline Phosphatase: 52 U/L (ref 39–117)
BUN: 12 mg/dL (ref 6–23)
CO2: 26 mEq/L (ref 19–32)
Calcium: 9 mg/dL (ref 8.4–10.5)
Chloride: 105 mEq/L (ref 96–112)
Creatinine, Ser: 1.41 mg/dL (ref 0.40–1.50)
GFR: 53.11 mL/min — ABNORMAL LOW (ref >60.00)
Glucose, Bld: 103 mg/dL — ABNORMAL HIGH (ref 70–99)
Potassium: 3.8 mEq/L (ref 3.5–5.1)
Sodium: 141 mEq/L (ref 135–145)
Total Bilirubin: 0.8 mg/dL (ref 0.2–1.2)
Total Protein: 7 g/dL (ref 6.0–8.3)

## 2022-04-11 LAB — TSH: TSH: 2.07 u[IU]/mL (ref 0.35–5.50)

## 2022-04-11 LAB — VITAMIN B12: Vitamin B-12: 299 pg/mL (ref 211–911)

## 2022-04-11 LAB — HEMOGLOBIN A1C: Hgb A1c MFr Bld: 5.9 % (ref 4.6–6.5)

## 2022-04-11 NOTE — Patient Instructions (Addendum)
It was very nice to see you today!  Message cardiologist on the low heart rate-consider decreasing the metoprolol to 1/2.    PLEASE NOTE:  If you had any lab tests please let us know if you have not heard back within a few days. You may see your results on MyChart before we have a chance to review them but we will give you a call once they are reviewed by [place]. If we ordered any referrals today, please let us know if you have not heard from their office within the next week.   Please try these tips to maintain a healthy lifestyle:  Eat most of your calories during the day when you are active. Eliminate processed foods including packaged sweets (pies, cakes, cookies), reduce intake of potatoes, white bread, white pasta, and white rice. Look for whole grain options, oat flour or almond flour.  Each meal should contain half fruits/vegetables, one quarter protein, and one quarter carbs (no bigger than a computer mouse).  Cut down on sweet beverages. This includes juice, soda, and sweet tea. Also watch fruit intake, though this is a healthier sweet option, it still contains natural sugar! Limit to 3 servings daily.  Drink at least 1 glass of water with each meal and aim for at least 8 glasses per day  Exercise at least 150 minutes every week.

## 2022-04-11 NOTE — Progress Notes (Signed)
Phone: [phone]   Subjective:  Patient 62 y.o. male presenting for annual physical.  Chief Complaint  Patient presents with   Annual Exam    Annual exam Fasting    Annual-not exercising.    UC-s/p colectomy-has J pouch-pt not sure of f/u.  Last cscope was 5 yrs ago.  GI had referred to GI at [facility] but they don't need f/u so not sure what is needed.   See problem oriented charting- ROS- ROS: Gen: no fever, chills .   No energy Skin: no rash, itching ENT: no ear pain, ear drainage, nasal congestion, rhinorrhea, sinus pressure, sore throat Eyes: no blurry vision, double vision Resp: no cough, wheeze,SOB CV: no CP, palpitations, LE edema,  GI: no heartburn, abd pain.  Still getting up at night at times for bowels. GU: no dysuria, urgency, frequency, hematuria.  Some dec stream.  Does empty bladder fine.  nocturia MSK: no joint pain, myalgias, back pain Neuro: no dizziness, headache, weakness, vertigo Psych: no depression, anxiety, insomnia, SI   The following were reviewed and entered/updated in epic: Past Medical History:  Diagnosis Date   A-fib ([place])    Allergy    Asthma    GERD (gastroesophageal reflux disease)    Heart murmur    Hiatal hernia    Hyperlipidemia    Ulcerative colitis 2001   Status post total colectomy with J-pouch in 2003 -    Patient Active Problem List   Diagnosis Date Noted   Ulcerative pancolitis without complication ([place]) 86[birth year]76/1950   Chronic mycotic otitis externa 09/09/2021   Neck mass 04/05/2021   Acute diffuse otitis externa of right ear 02/04/2021   Bradycardia 10/26/2020   Renal insufficiency 04/13/2020   Blood pressure elevated without history of HTN 04/13/2020   Hyperglycemia 04/01/2020   Vitamin D deficiency 04/01/2020   Paroxysmal atrial fibrillation ([place]) 05/04/2017   Hyperlipidemia    Hiatal hernia    Non-sustained ventricular tachycardia (HCC) - 5-20 beat runs 02/27/2017   PAT (paroxysmal atrial tachycardia) (HCC) -  vs. >? Atrial Flutter 02/27/2017   Heart palpitations 05/13/2016   Asthma with acute exacerbation 07/04/2015   Acute bronchitis 07/04/2015   Acute frontal sinusitis 03/25/2015   Encounter for well adult exam with abnormal findings 12/23/2014   Allergic rhinitis 06/25/2010   Actinic keratosis 06/25/2010   CONTACT DERMATITIS&OTHER ECZEMA DUE UNSPEC CAUSE 09/07/2009   Cough variant asthma 06/10/2009   Cough 06/10/2009   Near syncope 05/19/2009   PALPITATIONS, OCCASIONAL 05/19/2009   DERMATITIS, ATOPIC 10/04/2007   ASTHMA 01/22/2007   GERD 01/22/2007   CROHN'S DISEASE, LARGE AND SMALL INTESTINES 01/22/2007   GASTROINTESTINAL HEMORRHAGE, HX OF 01/22/2007   Past Surgical History:  Procedure Laterality Date   CHOLECYSTECTOMY     pt not sure   COLECTOMY  2003   with one step take-down to J-pouch   COLECTOMY     COLECTOMY  2003   total colectomy with J pouch (one step procedure)   COLECTOMY  2003   total colectomy with j pouch ( one step)   TONSILLECTOMY     TOTAL COLECTOMY  2003   total colectomy with J pouch     Family History  Problem Relation Age of Onset   Cancer Mother        breast and ovarian   Heart attack Father 56   Stomach cancer Father 78       stomach & bladder -cause of death   Lymphoma Brother 80  Currently 60   Colon cancer Neg Hx    Esophageal cancer Neg Hx    Pancreatic cancer Neg Hx    Rectal cancer Neg Hx     Medications- reviewed and updated Current Outpatient Medications  Medication Sig Dispense Refill   albuterol (VENTOLIN HFA) 108 (90 Base) MCG/ACT inhaler INHALE 2 PUFFS INTO THE LUNGS EVERY 6 (SIX) HOURS AS NEEDED FOR WHEEZING OR SHORTNESS OF BREATH. 24 g 3   aspirin 81 MG tablet Take 81 mg by mouth daily.     cetirizine (ZYRTEC) 10 MG tablet Take 10 mg by mouth daily.     Cholecalciferol (VITAMIN D) 50 MCG (2000 UT) CAPS Take 2,000 Units by mouth daily.     flecainide (TAMBOCOR) 50 MG tablet Take 1 tablet (50 mg total) by mouth 2 (two)  times daily. 180 tablet 3   metoprolol succinate (TOPROL-XL) 50 MG 24 hr tablet Take 1 tablet (50 mg total) by mouth daily. 30 tablet 3   pantoprazole (PROTONIX) 40 MG tablet Take 1 tablet (40 mg total) by mouth daily. 90 tablet 3   rosuvastatin (CRESTOR) 20 MG tablet Take 1 tablet (20 mg total) by mouth daily. Annual appt due in January pt must see provider for future refills 90 tablet 3   No current facility-administered medications for this visit.    Allergies-reviewed and updated No Known Allergies  Social History   Social History Narrative   Fun: Geocaching   Denies religious beliefs effecting health care.    Marketing/cust service   Objective  Objective:  BP 118/70   Pulse (!) 40   Temp 98.2 F (36.8 C) (Temporal)   Ht '6\' 3"'$  (1.905 m)   Wt 235 lb (106.6 kg)   SpO2 97%   BMI 29.37 kg/m  Physical Exam  Gen: WDWN NAD HEENT: NCAT, conjunctiva not injected, sclera nonicteric TM WNL B, OP moist, no exudates  NECK:  supple, no thyromegaly, no nodes, no carotid bruits CARDIAC: brady RRR, S1S2+, no murmur. DP 2+B LUNGS: CTAB. No wheezes ABDOMEN:  BS+, soft, NTND, No HSM, no masses EXT:  no edema MSK: no gross abnormalities. MS 5/5 all 4 NEURO: A&O x3.  CN II-XII intact.  PSYCH: normal mood. Good eye contact     Assessment and Plan   Health Maintenance counseling: 1. Anticipatory guidance: Patient counseled regarding regular dental exams q6 months, eye exams yearly, avoiding smoking and second hand smoke, limiting alcohol to 2 beverages per day.   2. Risk factor reduction:  Advised patient of need for regular exercise and diet rich in fruits and vegetables to reduce risk of heart attack and stroke. Exercise- encourage.   Wt Readings from Last 3 Encounters:  04/11/22 235 lb (106.6 kg)  03/03/22 243 lb (110.2 kg)  10/13/21 239 lb (108.4 kg)   3. Immunizations/screenings/ancillary studies Immunization History  Administered Date(s) Administered   COVID-19, mRNA,  vaccine(Comirnaty)12 years and older 10/14/2021   Influenza Split 12/26/2013   Influenza Whole 01/22/2007, 03/09/2009   Influenza,inj,Quad PF,6+ Mos 12/24/2015, 01/23/2017, 12/26/2019, 01/08/2022   Influenza-Unspecified 12/27/2014, 01/13/2018, 12/06/2020   Moderna Sars-Covid-2 Vaccination 06/07/2019, 07/05/2019   PFIZER(Purple Top)SARS-COV-2 Vaccination 01/25/2020, 08/13/2020, 12/10/2020   Tdap 06/29/2011   Zoster Recombinat (Shingrix) 08/13/2020, 12/06/2020   Health Maintenance Due  Topic Date Due   DTaP/Tdap/Td (2 - Td or Tdap) 06/28/2021    4. Prostate cancer screening >55yo - risk factors?  Lab Results  Component Value Date   PSA 1.04 04/05/2021   PSA 0.83 04/01/2020  PSA 0.69 03/20/2019    5. Colon cancer screening:utd 6. Skin cancer screening- Iadvised regular sunscreen use. Denies worrisome, changing, or new skin lesions.  7. Smoking associated screening (lung cancer screening, AAA screen 65-75, UA)- non smoker-    Problem List Items Addressed This Visit       Cardiovascular and Mediastinum   Paroxysmal atrial fibrillation (HCC) (Chronic)     Digestive   Ulcerative pancolitis without complication ([place])   Other Visit Diagnoses     Wellness examination    -  Primary      Wellness-anticipatory guidance.  Work on [employer].  Check CBC,CMP,lipids,TSH, A1C, check B12.  F/u 1 yr  UC- check B12.  Check on f/u w/ UC-message sent to Dr. Fuller Plan A fib-chronic.  Controlled on meds.  Managed by Card.  Profound bradycardia-some fatigue.  Advised to call Card-may need to decrease metoprolol to 1/2.    Recommended follow up: annualNo follow-ups on file. Future Appointments  Date Time Provider [place]  04/14/2022  2:00 PM Scotece, Francesco Sor, RD [place] N[facility]     Lab/Order associations:+ fasting   ICD-10-CM   1. Wellness examination  Z00.00     2. Ulcerative pancolitis without complication (HCC)  K27.06     3. Paroxysmal atrial fibrillation (HCC)  I48.0       No  orders of the defined types were placed in this encounter.    Wellington Hampshire, MD

## 2022-04-12 ENCOUNTER — Telehealth: Payer: Self-pay | Admitting: *Deleted

## 2022-04-12 NOTE — Progress Notes (Signed)
Labs okay except: 1.  Kidney function has worsened a little bit.  Make sure you are drinking enough water.  Suggest repeating in 3 to 6 months 2.  Vitamin B12 is on the lower end of normal-recommend taking B12 vitamins-get the sublingual form as you do not have the part of the colon that would absorb vitamin B12. 3.A1C(3 month average of sugars) is elevated.  This is considered PreDiabetes.  Work on diet-decrease sugars and starches and aim for 30 minutes of exercise 5 days/week to prevent progression to diabetes

## 2022-04-12 NOTE — Telephone Encounter (Signed)
Left message for patient to return my call.

## 2022-04-12 NOTE — Telephone Encounter (Signed)
-----  Message from Tawnya Crook, MD sent at 04/12/2022  7:38 AM EST ----- Please let pt know no GI f/u needed if no symptoms ----- Message ----- From: Ladene Artist, MD Sent: 04/11/2022   9:09 PM EST To: Tawnya Crook, MD  He is S/P colectomy with ileoanal pouch for UC. Pouch surveillance is not recommended in asymptomatic patients. If he has active GI symptoms recommend an office appt to evaluate.    ----- Message ----- From: Tawnya Crook, MD Sent: 04/11/2022   7:39 PM EST To: Ladene Artist, MD  Is pt due for cscope/follow up.  Please advise.

## 2022-04-14 ENCOUNTER — Encounter: Payer: Self-pay | Admitting: Skilled Nursing Facility1

## 2022-04-14 ENCOUNTER — Encounter: Payer: 59 | Attending: Family Medicine | Admitting: Skilled Nursing Facility1

## 2022-04-14 DIAGNOSIS — K5100 Ulcerative (chronic) pancolitis without complications: Secondary | ICD-10-CM | POA: Diagnosis not present

## 2022-04-14 NOTE — Progress Notes (Signed)
Medical Nutrition Therapy   Primary concerns today: to lose weight and possibly reduce bloat (concerned this may never go away)  Referral diagnosis: k51 Learning readiness: contemplating   NUTRITION ASSESSMENT   Clinical Medical Hx: Colectomy:J-Pouch, GERD, A-fib, Hyperlipidemia Medications: Beta Blocker  Labs:  Notable Signs/Symptoms: bloat  Lifestyle & Dietary Hx  Pt states he has put on about 20 pounds in the last 3 years due to a more sedentary job.  Pt states has been bloated a lot lately  Pt states he typically wakes at least once a night for a bowel movement.  Pt states he has learned raw vegetables cause issues but other than that he does not pay much attention to his foods and symptoms. Pt states he can fall asleep easily in the day and spoke with his doctor to cut his B-blocker in half to hopefully help increase his energy.  Pt states he did not eat out lunch as often in the past as he does now. Pt states he does not care much for his job but does enjoy having weekends off. Pt states he currently does not have any hobbies and does bored eat.   Estimated daily fluid intake: unsure  Supplements: vitmain D, B-12, A1C 5.9 Sleep: interrupted from bowel habits  Stress / self-care: appropriate  Current average weekly physical activity: ADL's  24-Hr Dietary Recall First Meal: caramel latte and banana or cereal and banana or fast food once a week Snack:  Second Meal: eating out close to 5 days a week Snack: chips Third Meal: salmon sweet potato asparagus and garlic bread or pasta + sauteed peppers and onions and red sauce with [place] sausage  Snack: mnms or ice cream  Beverages: caramel latte, water, soda once a week, maybe couple glasses of wine   NUTRITION DIAGNOSIS  [place]-1.4 Altered GI function As related to Colitis leading to colectomy.  As evidenced by pt reported signs and symptoms and decreased B12 over time.   NUTRITION INTERVENTION  Nutrition education (E-1) on  the following topics:  Educated pt on creation of balanced meals with specific meal and snack ideas  Relationship with food and how we eat is a physical manifestation of how we feel Eating with a colectomy  Importance of a balanced diet due to possible deficiencies from a colectomy  Motivation in movement and making changes   Handouts Provided Include  Meal ideas sheet Recipe sites  101 things to do Dining out  Learning Style & Readiness for Change Teaching method utilized: Visual & Auditory  Demonstrated degree of understanding via: Teach Back  Barriers to learning/adherence to lifestyle change: low motivation du to feeling he is always going to have the bloat so what is the point I might as well eat like I want: Dietitian encouraged pt to try the recommendations for at least 7 days hoping the relief he experiences will increase his motivation  Goals Established by Pt Stop eating anything including snacks within 3 hours of bedtime  Try to track your liquids: aiming for 80 ounces per day  If you notice water feeling heavy try alkaline water pH 8.8 Bring your lunch to work and leave work to eat it perhaps a walk or eta in your car  Crunchy and salty: dried edamame, dried chic peas, 1/4 cup nuts, 1/4 cup seeds  Avoid eating out but if you do eat out avoid raw vegetables and fried foods   MONITORING & EVALUATION Dietary intake, weekly physical activity  Next Steps  Patient  is to call or eamil with any future questions or concerns

## 2022-04-15 ENCOUNTER — Ambulatory Visit: Payer: 59 | Admitting: Internal Medicine

## 2022-04-17 DIAGNOSIS — U071 COVID-19: Secondary | ICD-10-CM | POA: Diagnosis not present

## 2022-04-18 IMAGING — DX DG CHEST 2V
2 series · 2 of 2 positions shown · non-contrast
Comparison: 10/26/2020

CLINICAL DATA: 60-year-old male with a history of persistent cough

EXAM:
CHEST - 2 VIEW

[chest pa]
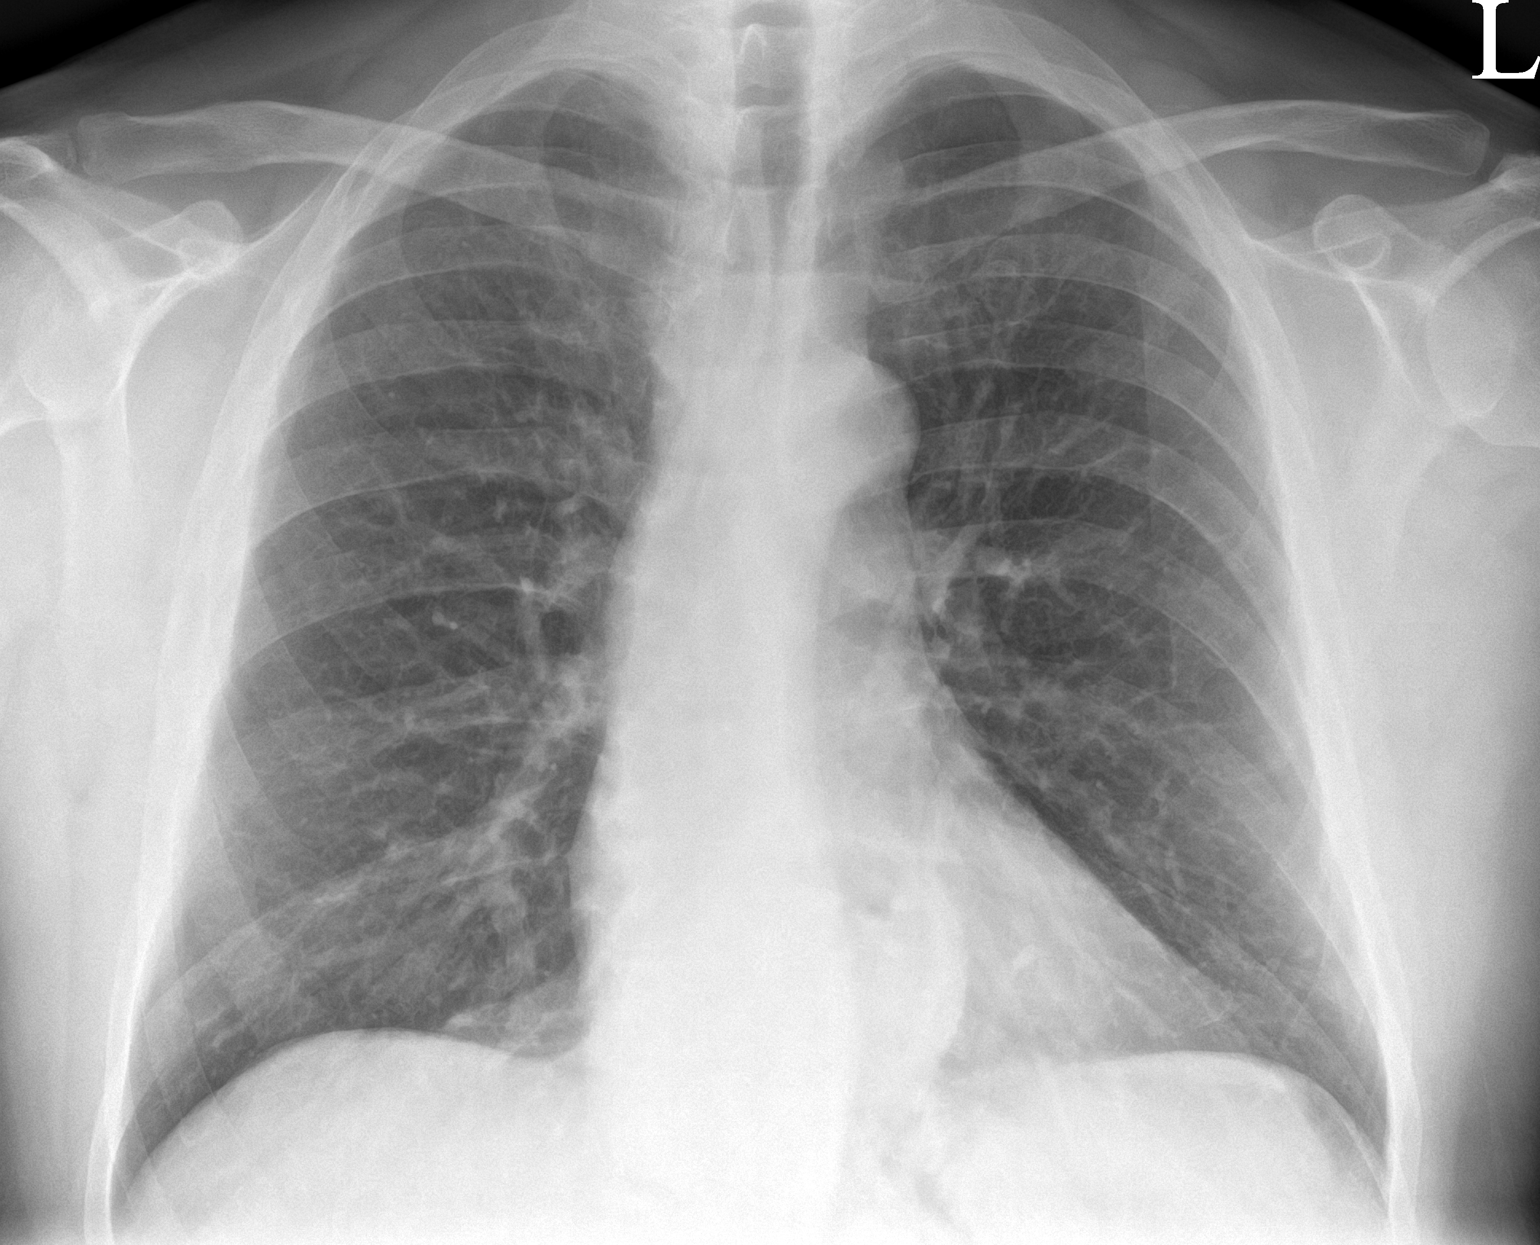

[chest lat]
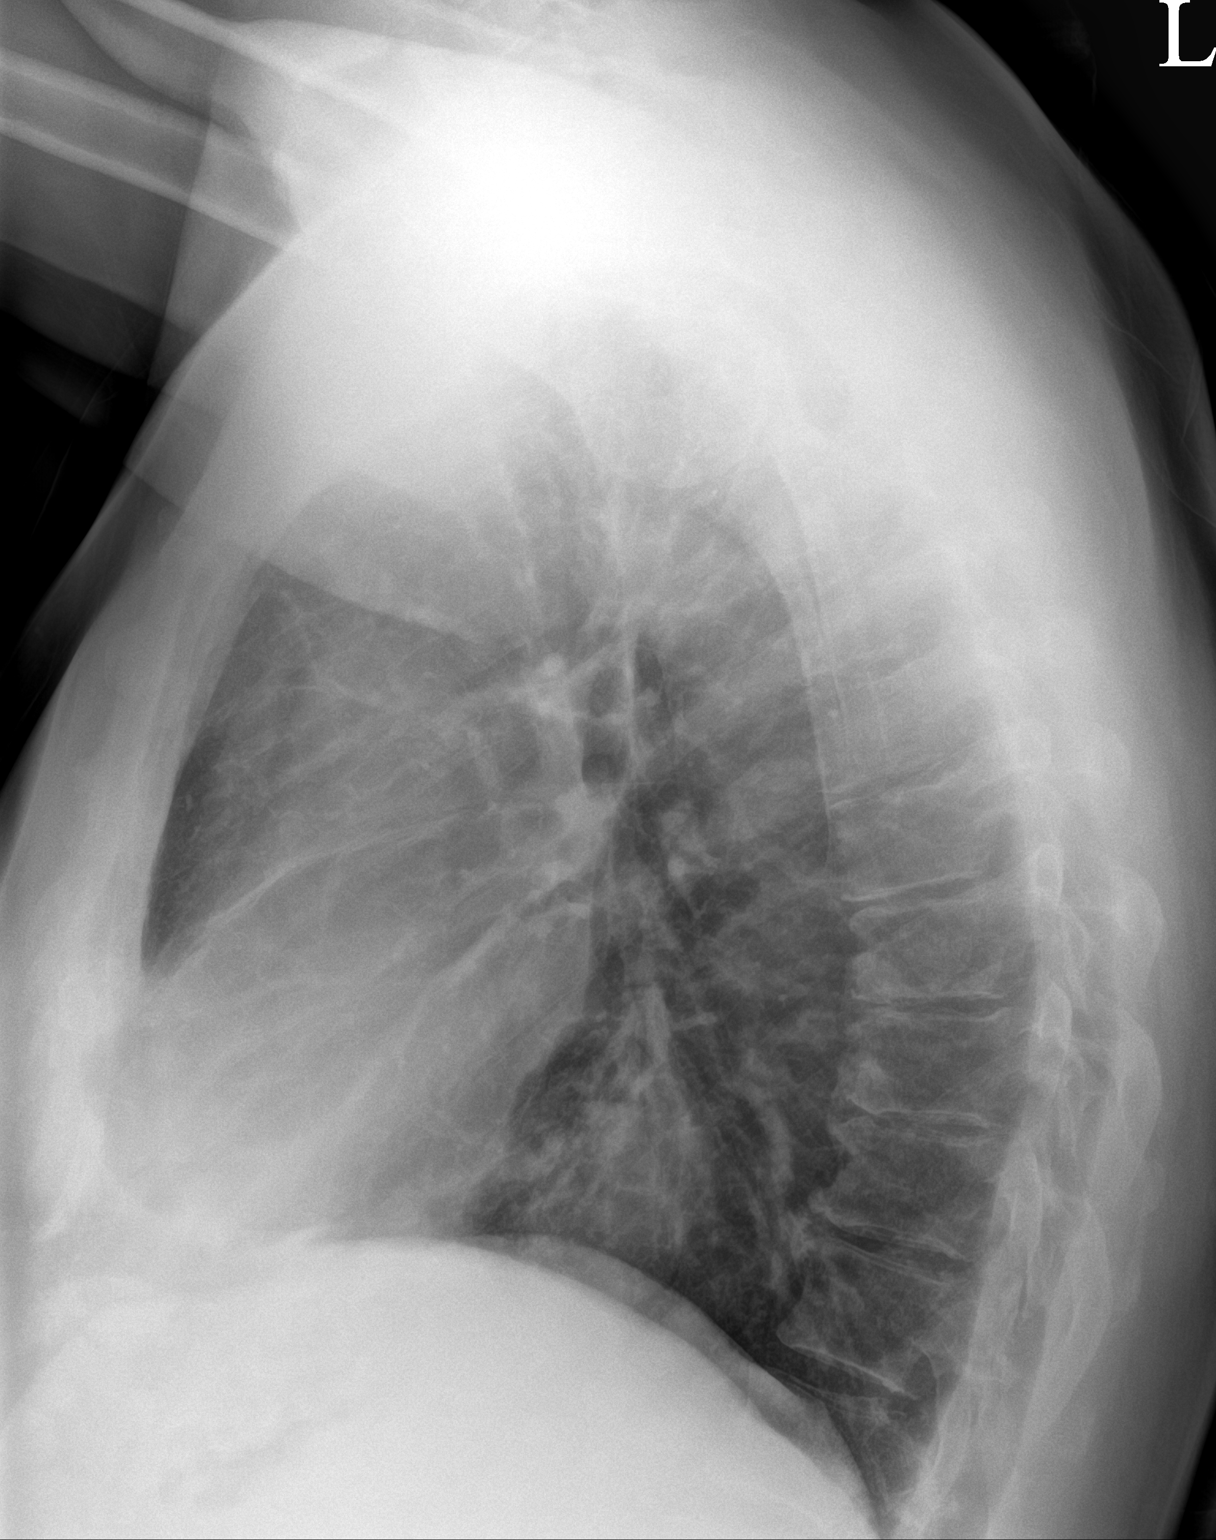

[2 of 2 positions shown; findings below may reference images not displayed]

FINDINGS: Cardiomediastinal silhouette unchanged in size and contour. No
evidence of central vascular congestion. No interlobular septal
thickening.

Double density overlying the lower mediastinum again demonstrated.

No pneumothorax or pleural effusion. Coarsened interstitial
markings, with no confluent airspace disease.

No acute displaced fracture. Degenerative changes of the spine.
IMPRESSION: Negative for acute cardiopulmonary disease.

Hiatal hernia

## 2022-05-31 ENCOUNTER — Telehealth: Payer: Self-pay | Admitting: Cardiology

## 2022-05-31 ENCOUNTER — Emergency Department (HOSPITAL_BASED_OUTPATIENT_CLINIC_OR_DEPARTMENT_OTHER): Payer: 59

## 2022-05-31 ENCOUNTER — Emergency Department (HOSPITAL_COMMUNITY): Payer: 59

## 2022-05-31 ENCOUNTER — Emergency Department (HOSPITAL_BASED_OUTPATIENT_CLINIC_OR_DEPARTMENT_OTHER): Payer: 59 | Admitting: Radiology

## 2022-05-31 ENCOUNTER — Encounter (HOSPITAL_BASED_OUTPATIENT_CLINIC_OR_DEPARTMENT_OTHER): Payer: Self-pay

## 2022-05-31 ENCOUNTER — Other Ambulatory Visit: Payer: Self-pay

## 2022-05-31 ENCOUNTER — Observation Stay (HOSPITAL_BASED_OUTPATIENT_CLINIC_OR_DEPARTMENT_OTHER)
Admission: EM | Admit: 2022-05-31 | Discharge: 2022-06-01 | Disposition: A | Discharge status: Home / Self Care | Payer: 59 | Attending: Internal Medicine | Admitting: Internal Medicine

## 2022-05-31 DIAGNOSIS — I10 Essential (primary) hypertension: Secondary | ICD-10-CM | POA: Insufficient documentation

## 2022-05-31 DIAGNOSIS — I480 Paroxysmal atrial fibrillation: Secondary | ICD-10-CM | POA: Diagnosis not present

## 2022-05-31 DIAGNOSIS — K219 Gastro-esophageal reflux disease without esophagitis: Secondary | ICD-10-CM | POA: Diagnosis present

## 2022-05-31 DIAGNOSIS — K449 Diaphragmatic hernia without obstruction or gangrene: Secondary | ICD-10-CM | POA: Diagnosis not present

## 2022-05-31 DIAGNOSIS — E785 Hyperlipidemia, unspecified: Secondary | ICD-10-CM | POA: Diagnosis not present

## 2022-05-31 DIAGNOSIS — Z79899 Other long term (current) drug therapy: Secondary | ICD-10-CM | POA: Insufficient documentation

## 2022-05-31 DIAGNOSIS — I639 Cerebral infarction, unspecified: Secondary | ICD-10-CM | POA: Diagnosis not present

## 2022-05-31 DIAGNOSIS — Z7982 Long term (current) use of aspirin: Secondary | ICD-10-CM | POA: Diagnosis not present

## 2022-05-31 DIAGNOSIS — J45909 Unspecified asthma, uncomplicated: Secondary | ICD-10-CM | POA: Diagnosis not present

## 2022-05-31 DIAGNOSIS — G459 Transient cerebral ischemic attack, unspecified: Secondary | ICD-10-CM | POA: Diagnosis not present

## 2022-05-31 DIAGNOSIS — K5190 Ulcerative colitis, unspecified, without complications: Secondary | ICD-10-CM | POA: Insufficient documentation

## 2022-05-31 DIAGNOSIS — R001 Bradycardia, unspecified: Secondary | ICD-10-CM | POA: Insufficient documentation

## 2022-05-31 DIAGNOSIS — R200 Anesthesia of skin: Secondary | ICD-10-CM | POA: Diagnosis not present

## 2022-05-31 DIAGNOSIS — R29818 Other symptoms and signs involving the nervous system: Secondary | ICD-10-CM | POA: Diagnosis not present

## 2022-05-31 DIAGNOSIS — R079 Chest pain, unspecified: Secondary | ICD-10-CM | POA: Diagnosis not present

## 2022-05-31 LAB — CBC
HCT: 42.1 % (ref 39.0–52.0)
Hemoglobin: 14.9 g/dL (ref 13.0–17.0)
MCH: 29.4 pg (ref 26.0–34.0)
MCHC: 35.4 g/dL (ref 30.0–36.0)
MCV: 83 fL (ref 80.0–100.0)
Platelets: 238 10*3/uL (ref 150–400)
RBC: 5.07 MIL/uL (ref 4.22–5.81)
RDW: 13.6 % (ref 11.5–15.5)
WBC: 8.4 10*3/uL (ref 4.0–10.5)
nRBC: 0 % (ref 0.0–0.2)

## 2022-05-31 LAB — BASIC METABOLIC PANEL
Anion gap: 9 (ref 5–15)
BUN: 17 mg/dL (ref 8–23)
CO2: 23 mmol/L (ref 22–32)
Calcium: 8.3 mg/dL — ABNORMAL LOW (ref 8.9–10.3)
Chloride: 108 mmol/L (ref 98–111)
Creatinine, Ser: 1.24 mg/dL (ref 0.61–1.24)
GFR, Estimated: >60 mL/min (ref >60)
Glucose, Bld: 119 mg/dL — ABNORMAL HIGH (ref 70–99)
Potassium: 4.1 mmol/L (ref 3.5–5.1)
Sodium: 140 mmol/L (ref 135–145)

## 2022-05-31 LAB — TROPONIN I (HIGH SENSITIVITY): Troponin I (High Sensitivity): 4 ng/L (ref <18)

## 2022-05-31 MED ORDER — IOHEXOL 350 MG/ML SOLN
100.0000 mL | Freq: Once | INTRAVENOUS | Status: AC | PRN
  Administered 2022-05-31: 75 mL via INTRAVENOUS

## 2022-05-31 NOTE — Telephone Encounter (Signed)
Pt stated last night he experienced heart palpitations, HR 111 and the symptoms last for an hour. As of today, around lunch time he starting to experience numbness on his left arm. Pt stated he can move his fingers however, he is concern of his symptoms. MD doesn't have any availability, advised pt to go to the nearest ED. Pt stated he will go to the ED for evaluation. Pt has scheduled for an OV on 3/15. Will forward to MD and nurse.

## 2022-05-31 NOTE — ED Provider Notes (Signed)
Joshua Li   CSN: [id number] Arrival date & time: 05/31/22  1533     History  Chief Complaint  Patient presents with   Numbness    L arm    Joshua Li is a 62 y.o. male.  He has history of atrial fibrillation intermittent controlled with metoprolol 25 mg at night and flecainide 3 times daily.  Is not on blood thinners.  He presents the ED today for evaluation of left arm numbness.  He states this started around lunchtime while he was in his car and lasted about an hour.  He called his cardiologist office and they recommended coming to the ED as a cardiologist not available to see him.  He denies speech difficulties, vision changes, weakness, chest pain or shortness of breath, symptoms. He does Li that he had racing heart last night and this morning up to 145 but has not resolved.  He states this happens from time to time.  Denies missing any doses of his medication.  HPI     Home Medications Prior to Admission medications   Medication Sig Start Date End Date Taking? Authorizing Provider  albuterol (VENTOLIN HFA) 108 (90 Base) MCG/ACT inhaler INHALE 2 PUFFS INTO THE LUNGS EVERY 6 (SIX) HOURS AS NEEDED FOR WHEEZING OR SHORTNESS OF BREATH. 04/09/21  Yes Biagio Borg, MD  aspirin 81 MG tablet Take 81 mg by mouth daily.   Yes [provider]  cetirizine (ZYRTEC) 10 MG tablet Take 10 mg by mouth daily.   Yes [provider]  Cholecalciferol (VITAMIN D) 50 MCG (2000 UT) CAPS Take 2,000 Units by mouth daily. 04/29/19  Yes [provider]  flecainide (TAMBOCOR) 50 MG tablet Take 1 tablet (50 mg total) by mouth 2 (two) times daily. 08/19/21  Yes Camnitz, Ocie Doyne, MD  metoprolol succinate (TOPROL-XL) 50 MG 24 hr tablet Take 1 tablet (50 mg total) by mouth daily. 02/25/22  Yes Camnitz, Will Hassell Done, MD  pantoprazole (PROTONIX) 40 MG tablet Take 1 tablet (40 mg total) by mouth daily. 04/08/21  Yes Biagio Borg, MD  rosuvastatin (CRESTOR) 20 MG tablet Take 1 tablet (20 mg total) by mouth daily. Annual appt due in January pt must see provider for future refills 04/08/21  Yes Biagio Borg, MD      Allergies    Patient has no known allergies.    Review of Systems   Review of Systems  Physical Exam Updated Vital Signs BP 117/72   Pulse (!) 45   Temp 98.1 F (36.7 C) (Oral)   Resp (!) 9   SpO2 99%  Physical Exam Vitals and nursing Li reviewed.  Constitutional:      General: He is not in acute distress.    Appearance: He is well-developed.  HENT:     Head: Normocephalic and atraumatic.     Mouth/Throat:     Mouth: Mucous membranes are moist.  Eyes:     Conjunctiva/sclera: Conjunctivae normal.  Cardiovascular:     Rate and Rhythm: Normal rate and regular rhythm.     Pulses: Normal pulses.     Heart sounds: Normal heart sounds. No murmur heard. Pulmonary:     Effort: Pulmonary effort is normal. No respiratory distress.     Breath sounds: Normal breath sounds.  Abdominal:     Palpations: Abdomen is soft.     Tenderness: There is no abdominal tenderness.  Musculoskeletal:  General: No swelling.     Cervical back: Normal range of motion and neck supple. No tenderness.  Skin:    General: Skin is warm and dry.     Capillary Refill: Capillary refill takes less than 2 seconds.  Neurological:     General: No focal deficit present.     Mental Status: He is alert and oriented to person, place, and time.     Sensory: No sensory deficit.     Motor: No weakness.     Coordination: Coordination normal.     Gait: Gait normal.  Psychiatric:        Mood and Affect: Mood normal.     ED Results / Procedures / Treatments   Labs (all labs ordered are listed, but only abnormal results are displayed) Labs Reviewed  BASIC METABOLIC PANEL - Abnormal; Notable for the following components:      Result Value   Glucose, Bld 119 (*)    Calcium 8.3 (*)    All other components within  normal limits  CBC  TROPONIN I (HIGH SENSITIVITY)  TROPONIN I (HIGH SENSITIVITY)    EKG EKG Interpretation  Date/Time:  Tuesday May 31 2022 15:55:14 EST Ventricular Rate:  48 PR Interval:  188 QRS Duration: 100 QT Interval:  466 QTC Calculation: 416 R Axis:   -23 Text Interpretation: sinus rhythm bradycardia Abnormal ECG Vent. rate has decreased BY  46 BPM Questionable change in QRS axis when compared to prior,  slower rate. No STEMI Confirmed by Antony Blackbird [phone]) on 05/31/2022 4:04:58 PM  Radiology CT Head Wo Contrast  Result Date: 05/31/2022 CLINICAL DATA:  Numbness in the left arm. EXAM: CT HEAD WITHOUT CONTRAST CT CERVICAL SPINE WITHOUT CONTRAST TECHNIQUE: Multidetector CT imaging of the head and cervical spine was performed following the standard protocol without intravenous contrast. Multiplanar CT image reconstructions of the cervical spine were also generated. RADIATION DOSE REDUCTION: This exam was performed according to the departmental dose-optimization program which includes automated exposure control, adjustment of the mA and/or kV according to patient size and/or use of iterative reconstruction technique. COMPARISON:  None Available. FINDINGS: CT HEAD FINDINGS Brain: The ventricles and sulci appropriate size for patient's age. Mild periventricular and deep white matter chronic microvascular ischemic changes noted. There is no acute intracranial hemorrhage. No mass effect or midline shift. No extra-axial fluid collection. Vascular: No hyperdense vessel or unexpected calcification. Skull: Normal. Negative for fracture or focal lesion. Sinuses/Orbits: Mild mucoperiosteal thickening of paranasal sinuses. No air-fluid level. The mastoid air cells are clear. Other: None CT CERVICAL SPINE FINDINGS Alignment: Normal. Skull base and vertebrae: No acute fracture. No primary bone lesion or focal pathologic process. Soft tissues and spinal canal: No prevertebral fluid or swelling. No  visible canal hematoma. Disc levels:  No acute findings.  Degenerative changes. Upper chest: Negative. Other: None IMPRESSION: 1. No acute intracranial pathology. Mild chronic microvascular ischemic changes. 2. No acute/traumatic cervical spine pathology. Electronically Signed   By: Anner Crete M.D.   On: 05/31/2022 17:06   CT Cervical Spine Wo Contrast  Result Date: 05/31/2022 CLINICAL DATA:  Numbness in the left arm. EXAM: CT HEAD WITHOUT CONTRAST CT CERVICAL SPINE WITHOUT CONTRAST TECHNIQUE: Multidetector CT imaging of the head and cervical spine was performed following the standard protocol without intravenous contrast. Multiplanar CT image reconstructions of the cervical spine were also generated. RADIATION DOSE REDUCTION: This exam was performed according to the departmental dose-optimization program which includes automated exposure control, adjustment of the mA and/or kV  according to patient size and/or use of iterative reconstruction technique. COMPARISON:  None Available. FINDINGS: CT HEAD FINDINGS Brain: The ventricles and sulci appropriate size for patient's age. Mild periventricular and deep white matter chronic microvascular ischemic changes noted. There is no acute intracranial hemorrhage. No mass effect or midline shift. No extra-axial fluid collection. Vascular: No hyperdense vessel or unexpected calcification. Skull: Normal. Negative for fracture or focal lesion. Sinuses/Orbits: Mild mucoperiosteal thickening of paranasal sinuses. No air-fluid level. The mastoid air cells are clear. Other: None CT CERVICAL SPINE FINDINGS Alignment: Normal. Skull base and vertebrae: No acute fracture. No primary bone lesion or focal pathologic process. Soft tissues and spinal canal: No prevertebral fluid or swelling. No visible canal hematoma. Disc levels:  No acute findings.  Degenerative changes. Upper chest: Negative. Other: None IMPRESSION: 1. No acute intracranial pathology. Mild chronic microvascular  ischemic changes. 2. No acute/traumatic cervical spine pathology. Electronically Signed   By: Anner Crete M.D.   On: 05/31/2022 17:06   DG Chest 2 View  Result Date: 05/31/2022 CLINICAL DATA:  cp EXAM: CHEST - 2 VIEW COMPARISON:  04/05/2021 FINDINGS: Cardiac silhouette is unremarkable. No pneumothorax or pleural effusion. The lungs are clear. Moderate-sized hiatal hernia is stable finding. There are thoracic degenerative changes. IMPRESSION: No acute cardiopulmonary process. Electronically Signed   By: Sammie Bench M.D.   On: 05/31/2022 16:11    Procedures Procedures    Medications Ordered in ED Medications  iohexol (OMNIPAQUE) 350 MG/ML injection 100 mL (has no administration in time range)    ED Course/ Medical Decision Making/ A&P                   NIH Stroke Scale: 0          Medical Decision Making This patient presents to the ED for concern of left arm numbness lasting approximately 1 hour-now resolved, this involves an extensive number of treatment options, and is a complaint that carries with it a high risk of complications and morbidity.  The differential diagnosis includes CVA, TIA, ACS, cervical radiculopathy, other   Co morbidities that complicate the patient evaluation  Afib, high cholesterol   Additional history obtained:  Additional history obtained from EMR External records from outside source obtained and reviewed including prior cardiology Li   Lab Tests:  I Ordered, and personally interpreted labs.  The pertinent results include:  CBC, CMP and troponin reassuring    Imaging Studies ordered:  I ordered imaging studies including CT head without contrast, CT C-spine I independently visualized and interpreted imaging which showed no intracranial hemorrhage, no acute C-spine findings I agree with the radiologist interpretation   Cardiac Monitoring: / EKG:  The patient was maintained on a cardiac monitor.  I personally viewed and interpreted the  cardiac monitored which showed an underlying rhythm of: Sinus bradycardia   Consultations Obtained:  I requested consultation with the teleneurologist Dr. Curly Shores,  and discussed lab and imaging findings as well as pertinent plan - they recommend: [place] to [facility] for MRI and CTA head and neck also should be performed.  Change she does not feel patient is a full stroke workup but given that to make his CHA2DS2-VASc score 2 he will require anticoagulation if his imaging does not show signs of a stroke.  Discussed with Dr. Roderic Palau at [facility] who accepts ED to ED   Problem List / ED Course / Critical interventions / Medication management  TIA-going to [facility] for MRI and CTA head  and neck will also be performed.  He is going to go POV, his partner is going to pick him up      Amount and/or Complexity of Data Reviewed Labs: ordered. Radiology: ordered.  Risk Prescription drug management.           Final Clinical Impression(s) / ED Diagnoses Final diagnoses:  TIA (transient ischemic attack)    Rx / DC Orders ED Discharge Orders     None         Darci Current 05/31/22 1815    Tegeler, Gwenyth Allegra, MD 05/31/22 2115

## 2022-05-31 NOTE — Telephone Encounter (Signed)
Patient c/o Palpitations:  High priority if patient c/o lightheadedness, shortness of breath, or chest pain  How long have you had palpitations/irregular HR/ Afib? Are you having the symptoms now?  Patient states last night his heart was racing for about 1 hour then went back to a normal rhythm No symptoms currently  Are you currently experiencing lightheadedness, SOB or CP?  No   Do you have a history of afib (atrial fibrillation) or irregular heart rhythm?    Have you checked your BP or HR? (document readings if available):  Last night during episode patient states his HR got up to 111, highest. No BP reading.  Are you experiencing any other symptoms?  Today left arm started to feel numb after lunch, has been about 1 hour and arm is still numb

## 2022-05-31 NOTE — Consult Note (Addendum)
Neurology Consultation  Reason for Consult: stroke Referring Physician: Dr Regenia Skeeter  CC: left arm numbness  History is obtained from: Patient, chart  HPI: Joshua Li is a 62 y.o. male past medical history of atrial fibrillation not on anticoagulation, hyperlipidemia, presenting to the emergency room for evaluation of left arm numbness that started suddenly this afternoon at work.  Reports that he was in his office and had a sudden onset of left arm numbness when he returned from lunch around 1 PM.  Symptoms persisted that made him come to the emergency room.  Had an episode of palpitations of heart rate up to 145 bpm that lasted for a while.  No prior history of stroke symptoms.  CHA2DS2-VASc score was 0 until now hence not on anticoagulation. No prior history of MIs.  No recent illnesses or sicknesses.  On review of systems, other than documented in the HPI, has a history of excessive snoring.  Not diagnosed with sleep apnea-not had any sleep studies yet   LKW: 1 PM today IV thrombolysis given?: no, minimal symptoms-decision made in the ED.  By the time neurology saw the patient, outside the window EVT: Not consistent with LVO Premorbid modified Rankin scale (mRS):0   ROS: Full ROS was performed and is negative except as noted in the HPI.  Past Medical History:  Diagnosis Date   A-fib ([place])    Allergy    Asthma    GERD (gastroesophageal reflux disease)    Heart murmur    Hiatal hernia    Hyperlipidemia    Ulcerative colitis 2001   Status post total colectomy with J-pouch in 2003 -     Family History  Problem Relation Age of Onset   Cancer Mother        breast and ovarian   Heart attack Father 46   Stomach cancer Father 58       stomach & bladder -cause of death   Lymphoma Brother 73       Currently 34   Colon cancer Neg Hx    Esophageal cancer Neg Hx    Pancreatic cancer Neg Hx    Rectal cancer Neg Hx     Social History:   reports that he has never smoked. He  has never used smokeless tobacco. He reports current alcohol use of about 2.0 standard drinks of alcohol per week. He reports that he does not use drugs. Medications No current facility-administered medications for this encounter.  Current Outpatient Medications:    albuterol (VENTOLIN HFA) 108 (90 Base) MCG/ACT inhaler, INHALE 2 PUFFS INTO THE LUNGS EVERY 6 (SIX) HOURS AS NEEDED FOR WHEEZING OR SHORTNESS OF BREATH., Disp: 24 g, Rfl: 3   aspirin 81 MG tablet, Take 81 mg by mouth daily., Disp: , Rfl:    cetirizine (ZYRTEC) 10 MG tablet, Take 10 mg by mouth daily., Disp: , Rfl:    Cholecalciferol (VITAMIN D) 50 MCG (2000 UT) CAPS, Take 2,000 Units by mouth daily., Disp: , Rfl:    flecainide (TAMBOCOR) 50 MG tablet, Take 1 tablet (50 mg total) by mouth 2 (two) times daily., Disp: 180 tablet, Rfl: 3   metoprolol succinate (TOPROL-XL) 50 MG 24 hr tablet, Take 1 tablet (50 mg total) by mouth daily., Disp: 30 tablet, Rfl: 3   pantoprazole (PROTONIX) 40 MG tablet, Take 1 tablet (40 mg total) by mouth daily., Disp: 90 tablet, Rfl: 3   rosuvastatin (CRESTOR) 20 MG tablet, Take 1 tablet (20 mg total) by mouth daily. Annual  appt due in January pt must see provider for future refills, Disp: 90 tablet, Rfl: 3  Exam: Current vital signs: BP (!) 120/95 (BP Location: Left Arm)   Pulse (!) 50   Temp 98.2 F (36.8 C) (Oral)   Resp 16   SpO2 100%  Vital signs in last 24 hours: Temp:  [98.1 F (36.7 C)-98.2 F (36.8 C)] 98.2 F (36.8 C) (03/05 2003) Pulse Rate:  [45-50] 50 (03/05 2003) Resp:  [9-20] 16 (03/05 2003) BP: (109-132)/(72-95) 120/95 (03/05 2003) SpO2:  [94 %-100 %] 100 % (03/05 2003) General: Awake alert in no distress HEENT: Normocephalic atraumatic Lungs: Clear Cardiovascular: Regular rhythm Abdomen nondistended nontender Neurological exam Awake alert oriented x 3, cranial nerves II to XII intact, motor exam with no deficits, sensory exam with mild subjective sensory decrease in the left  arm compared to the right, no incoordination, gait normal, stress gait is normal, coordination intact to finger-nose-finger testing bilaterally. NIH stroke scale-1 for sensory   Labs I have reviewed labs in epic and the results pertinent to this consultation are:  CBC    Component Value Date/Time   WBC 8.4 05/31/2022 1625   RBC 5.07 05/31/2022 1625   HGB 14.9 05/31/2022 1625   HCT 42.1 05/31/2022 1625   PLT 238 05/31/2022 1625   MCV 83.0 05/31/2022 1625   MCH 29.4 05/31/2022 1625   MCHC 35.4 05/31/2022 1625   RDW 13.6 05/31/2022 1625   LYMPHSABS 3.5 04/11/2022 1019   MONOABS 0.5 04/11/2022 1019   EOSABS 0.1 04/11/2022 1019   BASOSABS 0.1 04/11/2022 1019    CMP     Component Value Date/Time   NA 140 05/31/2022 1605   K 4.1 05/31/2022 1605   CL 108 05/31/2022 1605   CO2 23 05/31/2022 1605   GLUCOSE 119 (H) 05/31/2022 1605   BUN 17 05/31/2022 1605   CREATININE 1.24 05/31/2022 1605   CALCIUM 8.3 (L) 05/31/2022 1605   PROT 7.0 04/11/2022 1019   ALBUMIN 4.2 04/11/2022 1019   AST 16 04/11/2022 1019   ALT 19 04/11/2022 1019   ALKPHOS 52 04/11/2022 1019   BILITOT 0.8 04/11/2022 1019   GFRNONAA >60 05/31/2022 1605   GFRAA 92 02/06/2008 0907    Lipid Panel     Component Value Date/Time   CHOL 155 04/11/2022 1019   TRIG 136.0 04/11/2022 1019   TRIG 74 04/10/2006 0808   HDL 54.20 04/11/2022 1019   CHOLHDL 3 04/11/2022 1019   VLDL 27.2 04/11/2022 1019   LDLCALC 74 04/11/2022 1019   LDLDIRECT 123.3 03/02/2009 0832     Imaging I have reviewed the images obtained:  MR brain shows small acute to subacute punctate infarct in the right postcentral gyrus.  Assessment:  62 year old with history of atrial fibrillation not on anticoagulation presenting for evaluation of left arm numbness of sudden onset with persistent symptoms that did not resolve on its own which made him come to the ER for evaluation. MRI of the brain showed a small acute to subacute punctate infarct in  the right precentral gyrus Longer-term, now he will need anticoagulation. 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  Stroke size is very small, and can start  Eliquis on day 2 or 3-will defer to stroke team rounding once the workup is complete. High intensity statin for goal LDL less than 70.  His last lipid panel showed an LDL of 74.  Atorvastatin 40 should be sufficient. PT OT speech therapy assessments Bedside swallow evaluation-okay to start diet if clears bedside swallow evaluation. He has an appointment with his cardiologist Dr. Lennie Odor on the 15th, I have recommended he keep that appointment. Blood pressure parameters-allow for permissive hypertension.  Treat only if systolic blood pressures greater than 220 on appearing basis for 24 to 48 hours.  After that, blood pressure goal is normotension.  On discharge, will need referral to [facility] neurology stroke clinic NP as well as [place] neurology sleep neurologist for outpatient sleep study for possible OSA  Stroke team will follow with you. Plan was discussed with Cherlynn June, PA-C, ER Plan also discussed with the patient and his partner at bedside. -- Amie Portland, MD Neurologist [facility] Pager: [phone]

## 2022-05-31 NOTE — H&P (Signed)
History and Physical    Joshua Li [id number] DOB: [birth year] DOA: 05/31/2022  PCP: Tawnya Crook, MD  Patient coming from: Home  Chief Complaint: Left arm numbness  HPI: Joshua Li is a 62 y.o. male with medical history significant of paroxysmal A-fib/flutter not on anticoagulation due to CHA2DS2-VASc score of 0, SVT, hyperlipidemia, ulcerative colitis status post total colectomy with J-pouch, asthma, GERD presented to ED with sudden onset left arm numbness which lasted about an hour.  LKW 1 PM today.  CT head negative for acute finding.  No IV thrombolytics given in the ED given due to minimal symptoms.  CTA head and neck negative for LVO.  Brain MRI showing punctate acute to subacute cortical infarct in the right postcentral gyrus.  Neurology recommended admission for stroke workup. Patient reported history of palpitations last night and this morning with heart rate in the 140s.  In the ED, he was bradycardic with heart rate in the 40s to 50s.  Not hypotensive.  No significant lab abnormalities.  Calcium 8.3 but no albumin level checked.  Troponin negative.  Chest x-ray negative for acute finding.  EKG showing sinus bradycardia and no acute ischemic changes.  Patient states last night he was having palpitations and his heart rate was in the 110s on his Apple Watch.  This morning he had palpitations again and his heart rate was in the 140s.  States his heart rate was low a month ago and his physician had decreased the dose of his home metoprolol from 50 mg daily to 25 mg daily.  In the afternoon today around 1 PM he experienced sudden onset left arm numbness which resolved after about 1.5 hour.  He looked at his face in the mirror and was not having any facial droop.  He was not having any difficulty speaking or weakness of his arm.  No lower extremity weakness or numbness.  No vision changes.  He does not smoke cigarettes.  Denies history of prior stroke.  He has no other complaints.   Denies fevers, cough, shortness of breath, chest pain, nausea, vomiting, abdominal pain, or diarrhea.  Review of Systems:  Review of Systems  All other systems reviewed and are negative.   Past Medical History:  Diagnosis Date   A-fib ([place])    Allergy    Asthma    GERD (gastroesophageal reflux disease)    Heart murmur    Hiatal hernia    Hyperlipidemia    Ulcerative colitis 2001   Status post total colectomy with J-pouch in 2003 -     Past Surgical History:  Procedure Laterality Date   CHOLECYSTECTOMY     pt not sure   COLECTOMY  2003   with one step take-down to J-pouch   COLECTOMY     COLECTOMY  2003   total colectomy with J pouch (one step procedure)   COLECTOMY  2003   total colectomy with j pouch ( one step)   TONSILLECTOMY     TOTAL COLECTOMY  2003   total colectomy with J pouch      reports that he has never smoked. He has never used smokeless tobacco. He reports current alcohol use of about 2.0 standard drinks of alcohol per week. He reports that he does not use drugs.  No Known Allergies  Family History  Problem Relation Age of Onset   Cancer Mother        breast and ovarian   Heart attack Father 26  Stomach cancer Father 45       stomach & bladder -cause of death   Lymphoma Brother 70       Currently 37   Colon cancer Neg Hx    Esophageal cancer Neg Hx    Pancreatic cancer Neg Hx    Rectal cancer Neg Hx     Prior to Admission medications   Medication Sig Start Date End Date Taking? Authorizing Provider  albuterol (VENTOLIN HFA) 108 (90 Base) MCG/ACT inhaler INHALE 2 PUFFS INTO THE LUNGS EVERY 6 (SIX) HOURS AS NEEDED FOR WHEEZING OR SHORTNESS OF BREATH. 04/09/21  Yes Biagio Borg, MD  aspirin 81 MG tablet Take 81 mg by mouth daily.   Yes [provider]  cetirizine (ZYRTEC) 10 MG tablet Take 10 mg by mouth daily.   Yes [provider]  Cholecalciferol (VITAMIN D) 50 MCG (2000 UT) CAPS Take 2,000 Units by mouth daily. 04/29/19  Yes  [provider]  flecainide (TAMBOCOR) 50 MG tablet Take 1 tablet (50 mg total) by mouth 2 (two) times daily. 08/19/21  Yes Camnitz, Ocie Doyne, MD  metoprolol succinate (TOPROL-XL) 50 MG 24 hr tablet Take 1 tablet (50 mg total) by mouth daily. 02/25/22  Yes Camnitz, Will Hassell Done, MD  pantoprazole (PROTONIX) 40 MG tablet Take 1 tablet (40 mg total) by mouth daily. 04/08/21  Yes Biagio Borg, MD  rosuvastatin (CRESTOR) 20 MG tablet Take 1 tablet (20 mg total) by mouth daily. Annual appt due in January pt must see provider for future refills 04/08/21  Yes Biagio Borg, MD    Physical Exam: Vitals:   05/31/22 1630 05/31/22 1700 05/31/22 1730 05/31/22 2003  BP: 132/78 109/76 117/72 (!) 120/95  Pulse: (!) 49 (!) 48 (!) 45 (!) 50  Resp: 16 11 (!) 9 16  Temp:    98.2 F (36.8 C)  TempSrc:    Oral  SpO2: 99% 94% 99% 100%    Physical Exam Vitals reviewed.  Constitutional:      General: He is not in acute distress. HENT:     Head: Normocephalic and atraumatic.  Eyes:     Extraocular Movements: Extraocular movements intact.  Cardiovascular:     Rate and Rhythm: Normal rate and regular rhythm.     Pulses: Normal pulses.  Pulmonary:     Effort: Pulmonary effort is normal. No respiratory distress.     Breath sounds: Normal breath sounds. No wheezing or rales.  Abdominal:     General: Bowel sounds are normal.     Palpations: Abdomen is soft.     Tenderness: There is no abdominal tenderness. There is no guarding.  Musculoskeletal:     Cervical back: Normal range of motion.     Right lower leg: No edema.     Left lower leg: No edema.  Skin:    General: Skin is warm and dry.  Neurological:     General: No focal deficit present.     Mental Status: He is alert and oriented to person, place, and time.     Cranial Nerves: No cranial nerve deficit.     Sensory: No sensory deficit.     Motor: No weakness.     Labs on Admission: I have personally reviewed following labs and imaging  studies  CBC: Recent Labs  Lab 05/31/22 1625  WBC 8.4  HGB 14.9  HCT 42.1  MCV 83.0  PLT 99991111   Basic Metabolic Panel: Recent Labs  Lab 05/31/22 1605  NA 140  K 4.1  CL 108  CO2 23  GLUCOSE 119*  BUN 17  CREATININE 1.24  CALCIUM 8.3*   GFR: CrCl cannot be calculated (Unknown ideal weight.). Liver Function Tests: No results for input(s): "AST", "ALT", "ALKPHOS", "BILITOT", "PROT", "ALBUMIN" in the last 168 hours. No results for input(s): "LIPASE", "AMYLASE" in the last 168 hours. No results for input(s): "AMMONIA" in the last 168 hours. Coagulation Profile: No results for input(s): "INR", "PROTIME" in the last 168 hours. Cardiac Enzymes: No results for input(s): "CKTOTAL", "CKMB", "CKMBINDEX", "TROPONINI" in the last 168 hours. BNP (last 3 results) No results for input(s): "PROBNP" in the last 8760 hours. HbA1C: No results for input(s): "HGBA1C" in the last 72 hours. CBG: No results for input(s): "GLUCAP" in the last 168 hours. Lipid Profile: No results for input(s): "CHOL", "HDL", "LDLCALC", "TRIG", "CHOLHDL", "LDLDIRECT" in the last 72 hours. Thyroid Function Tests: No results for input(s): "TSH", "T4TOTAL", "FREET4", "T3FREE", "THYROIDAB" in the last 72 hours. Anemia Panel: No results for input(s): "VITAMINB12", "FOLATE", "FERRITIN", "TIBC", "IRON", "RETICCTPCT" in the last 72 hours. Urine analysis:    Component Value Date/Time   COLORURINE YELLOW 04/05/2021 [place] 04/05/2021 0939   LABSPEC >=1.030 (A) 04/05/2021 0939   PHURINE 6.0 04/05/2021 0939   GLUCOSEU NEGATIVE 04/05/2021 0939   HGBUR TRACE-LYSED (A) 04/05/2021 0939   HGBUR trace-lysed 03/02/2009 0827   BILIRUBINUR SMALL (A) 04/05/2021 0939   BILIRUBINUR n 06/26/2013 1212   KETONESUR NEGATIVE 04/05/2021 0939   PROTEINUR n 06/26/2013 1212   UROBILINOGEN 0.2 04/05/2021 0939   NITRITE NEGATIVE 04/05/2021 0939   LEUKOCYTESUR NEGATIVE 04/05/2021 0939    Radiological Exams on  Admission: MR BRAIN WO CONTRAST  Result Date: 05/31/2022 CLINICAL DATA:  Transient ischemic attack EXAM: MRI HEAD WITHOUT CONTRAST TECHNIQUE: Multiplanar, multiecho pulse sequences of the brain and surrounding structures were obtained without intravenous contrast. COMPARISON:  No prior MRI available, correlation is made with CT head 05/31/2022 FINDINGS: Brain: Punctate focus of restricted diffusion with ADC correlate in the right postcentral gyrus (series 3, image 43), consistent with acute to subacute infarct. This area is associated with mildly increased T2 hyperintense signal. No acute hemorrhage, mass, mass effect, or midline shift. No hydrocephalus or extra-axial collection. No hemosiderin deposition to suggest remote hemorrhage. Vascular: Normal arterial flow voids. Skull and upper cervical spine: Normal marrow signal. Sinuses/Orbits: Mucosal thickening in the ethmoid air cells and right maxillary sinus. No acute finding in the orbits. Other: The mastoids are well aerated. IMPRESSION: Punctate acute to subacute cortical infarct in the right postcentral gyrus. These results were called by telephone at the time of interpretation on 05/31/2022 at 9:52 pm to provider [facility] , who verbally acknowledged these results. Electronically Signed   By: Merilyn Baba M.D.   On: 05/31/2022 21:54   CT ANGIO HEAD NECK W WO CM  Result Date: 05/31/2022 CLINICAL DATA:  Left arm numbness EXAM: CT ANGIOGRAPHY HEAD AND NECK TECHNIQUE: Multidetector CT imaging of the head and neck was performed using the standard protocol during bolus administration of intravenous contrast. Multiplanar CT image reconstructions and MIPs were obtained to evaluate the vascular anatomy. Carotid stenosis measurements (when applicable) are obtained utilizing NASCET criteria, using the distal internal carotid diameter as the denominator. RADIATION DOSE REDUCTION: This exam was performed according to the departmental dose-optimization program which  includes automated exposure control, adjustment of the mA and/or kV according to patient size and/or use of iterative reconstruction technique. CONTRAST:  74m OMNIPAQUE IOHEXOL  350 MG/ML SOLN COMPARISON:  No prior CTA available, correlation is made with CT head 05/31/2022 FINDINGS: CT HEAD FINDINGS For noncontrast findings, please see same day CT head. CTA NECK FINDINGS Aortic arch: Standard branching. Imaged portion shows no evidence of aneurysm or dissection. No significant stenosis of the major arch vessel origins. Right carotid system: No evidence of stenosis, dissection, or occlusion. Left carotid system: No evidence of stenosis, dissection, or occlusion. Vertebral arteries: No evidence of stenosis, dissection, or occlusion. Skeleton: No acute osseous abnormality. Other neck: No acute finding. Upper chest: No focal pulmonary opacity or pleural effusion. Review of the MIP images confirms the above findings CTA HEAD FINDINGS Anterior circulation: Both internal carotid arteries are patent to the termini, without significant stenosis. A1 segments patent. Normal anterior communicating artery. Anterior cerebral arteries are patent to their distal aspects. No M1 stenosis or occlusion. MCA branches perfused and symmetric. Posterior circulation: Vertebral arteries patent to the vertebrobasilar junction without stenosis. Posterior inferior cerebellar arteries patent proximally. Basilar patent to its distal aspect. Superior cerebellar arteries patent proximally. Patent P1 segments. PCAs perfused to their distal aspects without stenosis. The bilateral posterior communicating arteries are patent. Venous sinuses: As permitted by contrast timing, patent. Anatomic variants: None significant. Review of the MIP images confirms the above findings IMPRESSION: 1. No intracranial large vessel occlusion or significant stenosis. 2. No hemodynamically significant stenosis in the neck. Electronically Signed   By: Merilyn Baba M.D.    On: 05/31/2022 19:06   CT Head Wo Contrast  Result Date: 05/31/2022 CLINICAL DATA:  Numbness in the left arm. EXAM: CT HEAD WITHOUT CONTRAST CT CERVICAL SPINE WITHOUT CONTRAST TECHNIQUE: Multidetector CT imaging of the head and cervical spine was performed following the standard protocol without intravenous contrast. Multiplanar CT image reconstructions of the cervical spine were also generated. RADIATION DOSE REDUCTION: This exam was performed according to the departmental dose-optimization program which includes automated exposure control, adjustment of the mA and/or kV according to patient size and/or use of iterative reconstruction technique. COMPARISON:  None Available. FINDINGS: CT HEAD FINDINGS Brain: The ventricles and sulci appropriate size for patient's age. Mild periventricular and deep white matter chronic microvascular ischemic changes noted. There is no acute intracranial hemorrhage. No mass effect or midline shift. No extra-axial fluid collection. Vascular: No hyperdense vessel or unexpected calcification. Skull: Normal. Negative for fracture or focal lesion. Sinuses/Orbits: Mild mucoperiosteal thickening of paranasal sinuses. No air-fluid level. The mastoid air cells are clear. Other: None CT CERVICAL SPINE FINDINGS Alignment: Normal. Skull base and vertebrae: No acute fracture. No primary bone lesion or focal pathologic process. Soft tissues and spinal canal: No prevertebral fluid or swelling. No visible canal hematoma. Disc levels:  No acute findings.  Degenerative changes. Upper chest: Negative. Other: None IMPRESSION: 1. No acute intracranial pathology. Mild chronic microvascular ischemic changes. 2. No acute/traumatic cervical spine pathology. Electronically Signed   By: Anner Crete M.D.   On: 05/31/2022 17:06   CT Cervical Spine Wo Contrast  Result Date: 05/31/2022 CLINICAL DATA:  Numbness in the left arm. EXAM: CT HEAD WITHOUT CONTRAST CT CERVICAL SPINE WITHOUT CONTRAST TECHNIQUE:  Multidetector CT imaging of the head and cervical spine was performed following the standard protocol without intravenous contrast. Multiplanar CT image reconstructions of the cervical spine were also generated. RADIATION DOSE REDUCTION: This exam was performed according to the departmental dose-optimization program which includes automated exposure control, adjustment of the mA and/or kV according to patient size and/or use of iterative reconstruction technique. COMPARISON:  None Available. FINDINGS: CT HEAD FINDINGS Brain: The ventricles and sulci appropriate size for patient's age. Mild periventricular and deep white matter chronic microvascular ischemic changes noted. There is no acute intracranial hemorrhage. No mass effect or midline shift. No extra-axial fluid collection. Vascular: No hyperdense vessel or unexpected calcification. Skull: Normal. Negative for fracture or focal lesion. Sinuses/Orbits: Mild mucoperiosteal thickening of paranasal sinuses. No air-fluid level. The mastoid air cells are clear. Other: None CT CERVICAL SPINE FINDINGS Alignment: Normal. Skull base and vertebrae: No acute fracture. No primary bone lesion or focal pathologic process. Soft tissues and spinal canal: No prevertebral fluid or swelling. No visible canal hematoma. Disc levels:  No acute findings.  Degenerative changes. Upper chest: Negative. Other: None IMPRESSION: 1. No acute intracranial pathology. Mild chronic microvascular ischemic changes. 2. No acute/traumatic cervical spine pathology. Electronically Signed   By: Anner Crete M.D.   On: 05/31/2022 17:06   DG Chest 2 View  Result Date: 05/31/2022 CLINICAL DATA:  cp EXAM: CHEST - 2 VIEW COMPARISON:  04/05/2021 FINDINGS: Cardiac silhouette is unremarkable. No pneumothorax or pleural effusion. The lungs are clear. Moderate-sized hiatal hernia is stable finding. There are thoracic degenerative changes. IMPRESSION: No acute cardiopulmonary process. Electronically Signed    By: Sammie Bench M.D.   On: 05/31/2022 16:11    Assessment and Plan  Acute CVA Patient presenting with sudden onset left arm numbness which lasted about an hour.  LKW 1 PM today.  CT head negative for acute finding.  No IV thrombolytics given in the ED given due to minimal symptoms.  CTA head and neck negative for LVO.  Brain MRI showing punctate acute to subacute cortical infarct in the right postcentral gyrus.  Neurology suspecting cardioembolic source given underlying atrial fibrillation not on anticoagulation. -Neurology recommending aspirin 81 mg daily for now but will need to be started on anticoagulation/preferably Eliquis in the next few days.  Stroke team will give recommendations regarding when to start Eliquis. -LDL was 74 on 04/11/2022. Goal LDL <70.  He is already on a high intensity statin (Crestor 20 mg daily).  Repeat lipid panel ordered and checking LFTs.  Will defer decision regarding increasing dose of Crestor to neurology. -A1c 5.9 on 04/11/2022 -Allow permissive hypertension for the first 24 to 48 hours, treat if systolic blood pressure greater than 220 -Telemetry monitoring -Echocardiogram -Frequent neurochecks -PT, OT, speech therapy. -N.p.o. until cleared by bedside swallow evaluation or formal speech evaluation   Paroxysmal A-fib/flutter  Sinus bradycardia Patient reported palpitations at home with heart rate in the 140s but has been bradycardic in the ED (sinus rhythm).  Heart rate currently in the 40s.  Not hypotensive.  TSH was normal on 04/11/2022.  Hold metoprolol at this time but continue his home flecainide.  Cardiac monitoring.  He is not on anticoagulation per cardiology due to CHA2DS2-VASc score of 0.  However, will need to be started on anticoagulation in the next few days due to concern for cardioembolic stroke.  Stroke team will give recommendations regarding when to start anticoagulation.  History of SVT Cardiac monitoring.  Continue flecainide.  Holding  metoprolol at this time due to bradycardia.  Hyperlipidemia -Continue Crestor  Ulcerative colitis Stable, not endorsing any GI symptoms at this time.  Asthma Stable, no signs of acute exacerbation.  Continue albuterol PRN.  GERD -Continue Protonix  DVT prophylaxis: Lovenox Code Status: Full Code (discussed with the patient) Family Communication: No family available at this time. Consults called: Neurology Level of care: Telemetry  bed Admission status: It is my clinical opinion that referral for OBSERVATION is reasonable and necessary in this patient based on the above information provided. The aforementioned taken together are felt to place the patient at high risk for further clinical deterioration. However, it is anticipated that the patient may be medically stable for discharge from the hospital within 24 to 48 hours.   Shela Leff MD [facility]  If 7PM-7AM, please contact night-coverage [email or web address]  05/31/2022, 11:05 PM

## 2022-05-31 NOTE — ED Notes (Signed)
Patient transported to MRI 

## 2022-05-31 NOTE — ED Notes (Signed)
Lab called to add on troponin. Labs re-drawn due to previous hemolysis.

## 2022-05-31 NOTE — Plan of Care (Signed)
Phone curbside requested by EDP regarding plan for this patient  Known history of Afib, not on [facility] per cardiology notes given CHA2DS2VASC score of 0.   However, today had transient LUE weakness, resolved by time of ED arrival and at baseline  Recommend MRI brain w/o contrast to exclude significant acute stroke given right brain strokes can have fairly subtle findings on exam at times. If this is negative, given this clinically sounds c/w TIA, with CHA2DS2VASC score of 2 would now be indicated for [facility] and with initiation of AC would be nearly medically optimized; would also obtain CTA head and neck to confirm no actionable stenosis.   Please call neurology for full consult if acute findings on MRI brain or significant stenosis on CTA, otherwise would recommend initiation of AC if no medical contraindications and continued outpatient follow-up.   These are curbside recommendations based upon the information readily available in the chart on brief review as well as history and examination information provided to me by requesting provider and do not replace a full consult  Lesleigh Noe MD-PhD [facility] [phone]

## 2022-05-31 NOTE — ED Triage Notes (Signed)
Pt c/o numbness in L arm, onset after lunch. Endorses hx afib, "pretty much in check," but states last night had racing heart. Denies CP, N/V. States he called cardiologist, spoke w triage RN there, advised to come to ED for further workup.

## 2022-05-31 NOTE — ED Provider Notes (Signed)
Patient accepted in transfer from [place] emergency department at [place].  Sherrye Payor, PA-C completed initial workup, please see her note for full details  In short, 62 year old male patient with history of atrial fibrillation intermittently controlled with metoprolol 25 mg at night and flecainide 3 times daily not currently on anticoagulation.  He presented to the emergency department today for evaluation of left arm numbness which began at around lunchtime and lasted about an hour.  He endorses an episode of palpitations/tachycardia last night that resolved on its own.  He denies missing any doses of medication.  Prior to transfer the patient had CT cervical spine, head without contrast; CT angio head neck with and without contrast, and a chest x-ray which were grossly unremarkable.  Teleneurology was consulted and recommended transfer to [facility] for MRI and further evaluation. Physical Exam  BP (!) 120/95 (BP Location: Left Arm)   Pulse (!) 50   Temp 98.2 F (36.8 C) (Oral)   Resp 16   SpO2 100%   Physical Exam  Procedures  Procedures  ED Course / MDM    Medical Decision Making Amount and/or Complexity of Data Reviewed Labs: ordered. Radiology: ordered.  Risk Prescription drug management.   MRI brain was ordered.  Results: Punctate acute to subacute cortical infarct in the right postcentral  gyrus.  I agree with radiologist findings  I requested consultation with a neurologist.  Dr. Malen Gauze suggested admission to medicine for full stroke workup and plan to consult on the patient once inpatient.  Requested consultation with the hospitalist who agreed to see the patient for admission.        Ronny Bacon 05/31/22 2258    Sherwood Gambler, MD 06/01/22 [phone]

## 2022-06-01 ENCOUNTER — Observation Stay (HOSPITAL_BASED_OUTPATIENT_CLINIC_OR_DEPARTMENT_OTHER): Payer: 59

## 2022-06-01 DIAGNOSIS — I6389 Other cerebral infarction: Secondary | ICD-10-CM | POA: Diagnosis not present

## 2022-06-01 DIAGNOSIS — I480 Paroxysmal atrial fibrillation: Secondary | ICD-10-CM | POA: Diagnosis not present

## 2022-06-01 DIAGNOSIS — I639 Cerebral infarction, unspecified: Secondary | ICD-10-CM | POA: Diagnosis not present

## 2022-06-01 LAB — HIV ANTIBODY (ROUTINE TESTING W REFLEX): HIV Screen 4th Generation wRfx: NONREACTIVE

## 2022-06-01 LAB — COMPREHENSIVE METABOLIC PANEL
ALT: 18 U/L (ref 0–44)
AST: 22 U/L (ref 15–41)
Albumin: 3.3 g/dL — ABNORMAL LOW (ref 3.5–5.0)
Alkaline Phosphatase: 41 U/L (ref 38–126)
Anion gap: 8 (ref 5–15)
BUN: 15 mg/dL (ref 8–23)
CO2: 22 mmol/L (ref 22–32)
Calcium: 8.1 mg/dL — ABNORMAL LOW (ref 8.9–10.3)
Chloride: 110 mmol/L (ref 98–111)
Creatinine, Ser: 1.33 mg/dL — ABNORMAL HIGH (ref 0.61–1.24)
GFR, Estimated: >60 mL/min (ref >60)
Glucose, Bld: 115 mg/dL — ABNORMAL HIGH (ref 70–99)
Potassium: 3.5 mmol/L (ref 3.5–5.1)
Sodium: 140 mmol/L (ref 135–145)
Total Bilirubin: 0.8 mg/dL (ref 0.3–1.2)
Total Protein: 6 g/dL — ABNORMAL LOW (ref 6.5–8.1)

## 2022-06-01 LAB — ECHOCARDIOGRAM COMPLETE
AR max vel: 2.93 cm2
AV Area VTI: 3.09 cm2
AV Area mean vel: 2.86 cm2
AV Mean grad: 5 mmHg
AV Peak grad: 9.7 mmHg
Ao pk vel: 1.56 m/s
Area-P 1/2: 3.48 cm2
Height: 75 in
S' Lateral: 3.2 cm
Weight: 3594.38 oz

## 2022-06-01 LAB — LIPID PANEL
Cholesterol: 133 mg/dL (ref 0–200)
HDL: 47 mg/dL (ref >40)
LDL Cholesterol: 69 mg/dL (ref 0–99)
Total CHOL/HDL Ratio: 2.8 RATIO
Triglycerides: 84 mg/dL (ref <150)
VLDL: 17 mg/dL (ref 0–40)

## 2022-06-01 LAB — TROPONIN I (HIGH SENSITIVITY): Troponin I (High Sensitivity): 7 ng/L (ref <18)

## 2022-06-01 MED ORDER — ENOXAPARIN SODIUM 40 MG/0.4ML IJ SOSY
40.0000 mg | PREFILLED_SYRINGE | INTRAMUSCULAR | Status: DC
  Administered 2022-06-01: 40 mg via SUBCUTANEOUS
  Filled 2022-06-01: qty 0.4

## 2022-06-01 MED ORDER — STROKE: EARLY STAGES OF RECOVERY BOOK: Freq: Once | Status: DC

## 2022-06-01 MED ORDER — ACETAMINOPHEN 160 MG/5ML PO SOLN: 650.0000 mg | Freq: Four times a day (QID) | ORAL | Status: DC | PRN

## 2022-06-01 MED ORDER — ASPIRIN 81 MG PO CHEW
81.0000 mg | CHEWABLE_TABLET | Freq: Every day | ORAL | Status: DC
  Administered 2022-06-01: 81 mg via ORAL
  Filled 2022-06-01: qty 1

## 2022-06-01 MED ORDER — PANTOPRAZOLE SODIUM 40 MG PO TBEC
40.0000 mg | DELAYED_RELEASE_TABLET | Freq: Every day | ORAL | Status: DC
  Administered 2022-06-01: 40 mg via ORAL
  Filled 2022-06-01: qty 1

## 2022-06-01 MED ORDER — ACETAMINOPHEN 650 MG RE SUPP: 650.0000 mg | Freq: Four times a day (QID) | RECTAL | Status: DC | PRN

## 2022-06-01 MED ORDER — ACETAMINOPHEN 325 MG PO TABS: 650.0000 mg | ORAL_TABLET | Freq: Four times a day (QID) | ORAL | Status: DC | PRN

## 2022-06-01 MED ORDER — ALBUTEROL SULFATE (2.5 MG/3ML) 0.083% IN NEBU: 2.5000 mg | INHALATION_SOLUTION | Freq: Four times a day (QID) | RESPIRATORY_TRACT | Status: DC | PRN

## 2022-06-01 MED ORDER — METOPROLOL SUCCINATE ER 25 MG PO TB24: 25.0000 mg | ORAL_TABLET | Freq: Every day | ORAL | 0 refills | Status: DC

## 2022-06-01 MED ORDER — APIXABAN 5 MG PO TABS: 5.0000 mg | ORAL_TABLET | Freq: Two times a day (BID) | ORAL | 0 refills | Status: DC

## 2022-06-01 MED ORDER — ROSUVASTATIN CALCIUM 20 MG PO TABS
20.0000 mg | ORAL_TABLET | Freq: Every day | ORAL | Status: DC
  Administered 2022-06-01: 20 mg via ORAL
  Filled 2022-06-01: qty 1

## 2022-06-01 MED ORDER — FLECAINIDE ACETATE 50 MG PO TABS
50.0000 mg | ORAL_TABLET | Freq: Two times a day (BID) | ORAL | Status: DC
  Administered 2022-06-01 (×2): 50 mg via ORAL
  Filled 2022-06-01 (×3): qty 1

## 2022-06-01 NOTE — Progress Notes (Signed)
OT Cancellation Note  Patient Details Name: Joshua Li MRN: [id number] DOB: 09/18/1960   Cancelled Treatment:    Reason Eval/Treat Not Completed: OT screened, no needs identified, will sign off. Per PT pt independent. Pt reports back to baseline with L UE numbness, discussed stroke signs/symptoms with no cognitive deficits noted.  OT will sign off, if further needs arise please re-consult.   [place] 06/01/2022, 10:14 AM

## 2022-06-01 NOTE — Evaluation (Signed)
Speech Language Pathology Evaluation Patient Details Name: Joshua Li MRN: [id number] DOB: 01-Apr-1960 Today's Date: 06/01/2022 Time: [id number] SLP Time Calculation (min) (ACUTE ONLY): 11 min  Problem List:  Patient Active Problem List   Diagnosis Date Noted   Acute CVA (cerebrovascular accident) ([place]) 05/31/2022   Ulcerative colitis ([place]) 03/03/2022   Chronic mycotic otitis externa 09/09/2021   Neck mass 04/05/2021   Acute diffuse otitis externa of right ear 02/04/2021   Sinus bradycardia 10/26/2020   Renal insufficiency 04/13/2020   Blood pressure elevated without history of HTN 04/13/2020   Hyperglycemia 04/01/2020   Vitamin D deficiency 04/01/2020   Paroxysmal atrial fibrillation ([place]) 05/04/2017   HLD (hyperlipidemia)    Hiatal hernia    GERD (gastroesophageal reflux disease)    Non-sustained ventricular tachycardia (HCC) - 5-20 beat runs 02/27/2017   PAT (paroxysmal atrial tachycardia) (HCC) - vs. >? 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   GASTROINTESTINAL HEMORRHAGE, HX OF 01/22/2007   Past Medical History:  Past Medical History:  Diagnosis Date   A-fib ([place])    Allergy    Asthma    GERD (gastroesophageal reflux disease)    Heart murmur    Hiatal hernia    Hyperlipidemia    Ulcerative colitis 2001   Status post total colectomy with J-pouch in 2003 -    Past Surgical History:  Past Surgical History:  Procedure  Laterality Date   CHOLECYSTECTOMY     pt not sure   COLECTOMY  2003   with one step take-down to J-pouch   COLECTOMY     COLECTOMY  2003   total colectomy with J pouch (one step procedure)   COLECTOMY  2003   total colectomy with j pouch ( one step)   TONSILLECTOMY     TOTAL COLECTOMY  2003   total colectomy with J pouch    HPI:  Pt is a 62 y.o. male who presented 05/31/22 with L arm numbness. MRI showed punctate acute to subacute cortical infarct in the right postcentral  gyrus. PMH: A-fib, asthma, GERD, heart murmur, HLD   Assessment / Plan / Recommendation Clinical Impression  Pt noted left arm numbness with onset of symptoms but denied difficulty with communication or cognition. He is currently working full time and exhibits normal oromotor abilities. He is intelligible and language in intact. From a cogntive standpoint he scored within normal range on the SLUMS (28/30) and does not need further ST at this time. Pt is in agreement with assessment results.    SLP Assessment  SLP Recommendation/Assessment: Patient does not need any further Speech [place] [facility] [facility] SLP Visit Diagnosis: Cognitive communication deficit (R41.841)    Recommendations for follow up therapy are one component of a multi-disciplinary discharge planning process, led by the attending physician.  Recommendations may be updated based on patient status, additional functional criteria and insurance authorization.    Follow Up Recommendations  No SLP follow up    Assistance Recommended at Discharge  None  Functional Status Assessment Patient has not had a recent decline in their functional status  Frequency and Duration           SLP Evaluation Cognition  Overall Cognitive Status: Within Functional Limits for tasks assessed Arousal/Alertness: Awake/alert Orientation Level: Oriented X4 Year: 2024 Day of Week: Correct Attention: Sustained Sustained Attention: Appears intact Memory:  Impaired Memory Impairment:  (3/5 word recall) Awareness: Appears intact Problem Solving: Appears intact Safety/Judgment: Appears intact       Comprehension  Auditory Comprehension Overall Auditory Comprehension: Appears within functional limits for tasks assessed Visual Recognition/Discrimination Discrimination: Not tested Reading Comprehension Reading Status: Not tested    Expression Expression Primary Mode of Expression: Verbal Verbal Expression Overall Verbal Expression: Appears within functional limits for tasks assessed Naming: No impairment Pragmatics: No impairment Written Expression Dominant Hand: Right Written Expression: Not tested   Oral / Motor  Oral Motor/Sensory Function Overall Oral Motor/Sensory Function: Within functional limits Motor Speech Overall Motor Speech: Appears within functional limits for tasks assessed Phonation: Normal Resonance: Within functional limits Articulation: Within functional limitis Intelligibility: Intelligible Motor Planning: Witnin functional limits            Houston Siren 06/01/2022, 10:40 AM

## 2022-06-01 NOTE — Progress Notes (Signed)
Stroke education given to pt. Discussed BEFAST, 911 activation, individual risk factors for stroke.  [place] RN Stroke Response

## 2022-06-01 NOTE — Progress Notes (Signed)
Discharge instructions reviewed with.  Written and verbal stroke info/education provided.  Said wife will be here shortly to pick him up.

## 2022-06-01 NOTE — Plan of Care (Signed)

## 2022-06-01 NOTE — Progress Notes (Addendum)
STROKE TEAM PROGRESS NOTE   SUBJECTIVE (INTERVAL HISTORY) Dr. Posey Pronto is at the bedside.  Overall his condition is completely resolved.  He is okay to start anticoagulation for stroke prevention.   OBJECTIVE Temp:  [97.4 F (36.3 C)-98.4 F (36.9 C)] 98.2 F (36.8 C) (03/06 1500) Pulse Rate:  [41-90] 88 (03/06 1500) Cardiac Rhythm: Heart block;Bundle branch block (03/06 0827) Resp:  [13-19] 18 (03/06 1500) BP: (111-164)/(52-95) 128/77 (03/06 1500) SpO2:  [96 %-100 %] 100 % (03/06 1500) Weight:  [101.9 kg] 101.9 kg (03/06 0412)  No results for input(s): "GLUCAP" in the last 168 hours. Recent Labs  Lab 05/31/22 1605 06/01/22 0025  NA 140 140  K 4.1 3.5  CL 108 110  CO2 23 22  GLUCOSE 119* 115*  BUN 17 15  CREATININE 1.24 1.33*  CALCIUM 8.3* 8.1*   Recent Labs  Lab 06/01/22 0025  AST 22  ALT 18  ALKPHOS 41  BILITOT 0.8  PROT 6.0*  ALBUMIN 3.3*   Recent Labs  Lab 05/31/22 1625  WBC 8.4  HGB 14.9  HCT 42.1  MCV 83.0  PLT 238   No results for input(s): "CKTOTAL", "CKMB", "CKMBINDEX", "TROPONINI" in the last 168 hours. No results for input(s): "LABPROT", "INR" in the last 72 hours. No results for input(s): "COLORURINE", "LABSPEC", "PHURINE", "GLUCOSEU", "HGBUR", "BILIRUBINUR", "KETONESUR", "PROTEINUR", "UROBILINOGEN", "NITRITE", "LEUKOCYTESUR" in the last 72 hours.  Invalid input(s): "APPERANCEUR"     Component Value Date/Time   CHOL 133 06/01/2022 0025   TRIG 84 06/01/2022 0025   TRIG 74 04/10/2006 0808   HDL 47 06/01/2022 0025   CHOLHDL 2.8 06/01/2022 0025   VLDL 17 06/01/2022 0025   LDLCALC 69 06/01/2022 0025   Lab Results  Component Value Date   HGBA1C 5.9 04/11/2022   No results found for: "LABOPIA", "COCAINSCRNUR", "LABBENZ", "AMPHETMU", "THCU", "LABBARB"  No results for input(s): "ETH" in the last 168 hours.  I have personally reviewed the radiological images below and agree with the radiology interpretations.  ECHOCARDIOGRAM COMPLETE  Result  Date: 06/01/2022    ECHOCARDIOGRAM REPORT   Patient Name:   Joshua Li Date of Exam: 06/01/2022 Medical Rec #:  [id number]     Height:       75.0 in Accession #:    [id number]    Weight:       224.6 lb Date of Birth:  1961/02/10     BSA:          2.305 m Patient Age:    62 years      BP:           127/84 mmHg Patient Gender: M             HR:           56 bpm. Exam Location:  Inpatient Procedure: 2D Echo, Cardiac Doppler and Color Doppler Indications:    Stroke I63.9  History:        Patient has prior history of Echocardiogram examinations, most                 recent 03/16/2017. Stroke, Arrythmias:Tachycardia, Bradycardia                 and Atrial Fibrillation; Risk Factors:Dyslipidemia.  Sonographer:    Ronny Flurry Referring Phys: [id number] [place]  1. Left ventricular ejection fraction, by estimation, is 55 to 60%. The left ventricle has normal function. The left ventricle has no regional wall motion abnormalities. Left ventricular diastolic parameters  are consistent with Grade I diastolic dysfunction (impaired relaxation).  2. Right ventricular systolic function is normal. The right ventricular size is normal. Tricuspid regurgitation signal is inadequate for assessing PA pressure.  3. The mitral valve is normal in structure. No evidence of mitral valve regurgitation. No evidence of mitral stenosis.  4. The aortic valve is tricuspid. Aortic valve regurgitation is trivial. No aortic stenosis is present.  5. Aortic dilatation noted. There is mild dilatation of the aortic root, measuring 39 mm.  6. The inferior vena cava is dilated in size with >50% respiratory variability, suggesting right atrial pressure of 8 mmHg. FINDINGS  Left Ventricle: Left ventricular ejection fraction, by estimation, is 55 to 60%. The left ventricle has normal function. The left ventricle has no regional wall motion abnormalities. The left ventricular internal cavity size was normal in size. There is  no left  ventricular hypertrophy. Left ventricular diastolic parameters are consistent with Grade I diastolic dysfunction (impaired relaxation). Right Ventricle: The right ventricular size is normal. No increase in right ventricular wall thickness. Right ventricular systolic function is normal. Tricuspid regurgitation signal is inadequate for assessing PA pressure. Left Atrium: Left atrial size was normal in size. Right Atrium: Right atrial size was normal in size. Pericardium: There is no evidence of pericardial effusion. Mitral Valve: The mitral valve is normal in structure. No evidence of mitral valve regurgitation. No evidence of mitral valve stenosis. Tricuspid Valve: The tricuspid valve is normal in structure. Tricuspid valve regurgitation is not demonstrated. Aortic Valve: The aortic valve is tricuspid. Aortic valve regurgitation is trivial. No aortic stenosis is present. Aortic valve mean gradient measures 5.0 mmHg. Aortic valve peak gradient measures 9.7 mmHg. Aortic valve area, by VTI measures 3.09 cm. Pulmonic Valve: The pulmonic valve was normal in structure. Pulmonic valve regurgitation is not visualized. Aorta: Aortic dilatation noted. There is mild dilatation of the aortic root, measuring 39 mm. Venous: The inferior vena cava is dilated in size with greater than 50% respiratory variability, suggesting right atrial pressure of 8 mmHg. IAS/Shunts: No atrial level shunt detected by color flow Doppler.  LEFT VENTRICLE PLAX 2D LVIDd:         4.80 cm   Diastology LVIDs:         3.20 cm   LV e' medial:    8.05 cm/s LV PW:         1.00 cm   LV E/e' medial:  11.6 LV IVS:        0.80 cm   LV e' lateral:   9.36 cm/s LVOT diam:     2.20 cm   LV E/e' lateral: 10.0 LV SV:         90 LV SV Index:   39 LVOT Area:     3.80 cm  RIGHT VENTRICLE             IVC RV S prime:     20.00 cm/s  IVC diam: 2.20 cm TAPSE (M-mode): 2.6 cm LEFT ATRIUM             Index        RIGHT ATRIUM           Index LA diam:        3.00 cm 1.30 cm/m    RA Area:     12.10 cm LA Vol (A2C):   20.4 ml 8.85 ml/m   RA Volume:   24.30 ml  10.54 ml/m LA Vol (A4C):   69.9 ml 30.32 ml/m LA  Biplane Vol: 41.4 ml 17.96 ml/m  AORTIC VALVE AV Area (Vmax):    2.93 cm AV Area (Vmean):   2.86 cm AV Area (VTI):     3.09 cm AV Vmax:           155.50 cm/s AV Vmean:          100.000 cm/s AV VTI:            0.291 m AV Peak Grad:      9.7 mmHg AV Mean Grad:      5.0 mmHg LVOT Vmax:         119.67 cm/s LVOT Vmean:        75.367 cm/s LVOT VTI:          0.237 m LVOT/AV VTI ratio: 0.81  AORTA Ao Root diam: 3.90 cm MITRAL VALVE MV Area (PHT): 3.48 cm    SHUNTS MV Decel Time: 218 msec    Systemic VTI:  0.24 m MV E velocity: 93.40 cm/s  Systemic Diam: 2.20 cm MV A velocity: 93.40 cm/s MV E/A ratio:  1.00 Dalton McleanMD Electronically signed by Franki Monte Signature Date/Time: 06/01/2022/5:51:25 PM    Final    MR BRAIN WO CONTRAST  Result Date: 05/31/2022 CLINICAL DATA:  Transient ischemic attack EXAM: MRI HEAD WITHOUT CONTRAST TECHNIQUE: Multiplanar, multiecho pulse sequences of the brain and surrounding structures were obtained without intravenous contrast. COMPARISON:  No prior MRI available, correlation is made with CT head 05/31/2022 FINDINGS: Brain: Punctate focus of restricted diffusion with ADC correlate in the right postcentral gyrus (series 3, image 43), consistent with acute to subacute infarct. This area is associated with mildly increased T2 hyperintense signal. No acute hemorrhage, mass, mass effect, or midline shift. No hydrocephalus or extra-axial collection. No hemosiderin deposition to suggest remote hemorrhage. Vascular: Normal arterial flow voids. Skull and upper cervical spine: Normal marrow signal. Sinuses/Orbits: Mucosal thickening in the ethmoid air cells and right maxillary sinus. No acute finding in the orbits. Other: The mastoids are well aerated. IMPRESSION: Punctate acute to subacute cortical infarct in the right postcentral gyrus. These results were  called by telephone at the time of interpretation on 05/31/2022 at 9:52 pm to provider [facility] , who verbally acknowledged these results. Electronically Signed   By: Merilyn Baba M.D.   On: 05/31/2022 21:54   CT ANGIO HEAD NECK W WO CM  Result Date: 05/31/2022 CLINICAL DATA:  Left arm numbness EXAM: CT ANGIOGRAPHY HEAD AND NECK TECHNIQUE: Multidetector CT imaging of the head and neck was performed using the standard protocol during bolus administration of intravenous contrast. Multiplanar CT image reconstructions and MIPs were obtained to evaluate the vascular anatomy. Carotid stenosis measurements (when applicable) are obtained utilizing NASCET criteria, using the distal internal carotid diameter as the denominator. RADIATION DOSE REDUCTION: This exam was performed according to the departmental dose-optimization program which includes automated exposure control, adjustment of the mA and/or kV according to patient size and/or use of iterative reconstruction technique. CONTRAST:  66m OMNIPAQUE IOHEXOL 350 MG/ML SOLN COMPARISON:  No prior CTA available, correlation is made with CT head 05/31/2022 FINDINGS: CT HEAD FINDINGS For noncontrast findings, please see same day CT head. CTA NECK FINDINGS Aortic arch: Standard branching. Imaged portion shows no evidence of aneurysm or dissection. No significant stenosis of the major arch vessel origins. Right carotid system: No evidence of stenosis, dissection, or occlusion. Left carotid system: No evidence of stenosis, dissection, or occlusion. Vertebral arteries: No evidence of stenosis, dissection, or occlusion. Skeleton: No acute  osseous abnormality. Other neck: No acute finding. Upper chest: No focal pulmonary opacity or pleural effusion. Review of the MIP images confirms the above findings CTA HEAD FINDINGS Anterior circulation: Both internal carotid arteries are patent to the termini, without significant stenosis. A1 segments patent. Normal anterior communicating  artery. Anterior cerebral arteries are patent to their distal aspects. No M1 stenosis or occlusion. MCA branches perfused and symmetric. Posterior circulation: Vertebral arteries patent to the vertebrobasilar junction without stenosis. Posterior inferior cerebellar arteries patent proximally. Basilar patent to its distal aspect. Superior cerebellar arteries patent proximally. Patent P1 segments. PCAs perfused to their distal aspects without stenosis. The bilateral posterior communicating arteries are patent. Venous sinuses: As permitted by contrast timing, patent. Anatomic variants: None significant. Review of the MIP images confirms the above findings IMPRESSION: 1. No intracranial large vessel occlusion or significant stenosis. 2. No hemodynamically significant stenosis in the neck. Electronically Signed   By: Merilyn Baba M.D.   On: 05/31/2022 19:06   CT Head Wo Contrast  Result Date: 05/31/2022 CLINICAL DATA:  Numbness in the left arm. EXAM: CT HEAD WITHOUT CONTRAST CT CERVICAL SPINE WITHOUT CONTRAST TECHNIQUE: Multidetector CT imaging of the head and cervical spine was performed following the standard protocol without intravenous contrast. Multiplanar CT image reconstructions of the cervical spine were also generated. RADIATION DOSE REDUCTION: This exam was performed according to the departmental dose-optimization program which includes automated exposure control, adjustment of the mA and/or kV according to patient size and/or use of iterative reconstruction technique. COMPARISON:  None Available. FINDINGS: CT HEAD FINDINGS Brain: The ventricles and sulci appropriate size for patient's age. Mild periventricular and deep white matter chronic microvascular ischemic changes noted. There is no acute intracranial hemorrhage. No mass effect or midline shift. No extra-axial fluid collection. Vascular: No hyperdense vessel or unexpected calcification. Skull: Normal. Negative for fracture or focal lesion.  Sinuses/Orbits: Mild mucoperiosteal thickening of paranasal sinuses. No air-fluid level. The mastoid air cells are clear. Other: None CT CERVICAL SPINE FINDINGS Alignment: Normal. Skull base and vertebrae: No acute fracture. No primary bone lesion or focal pathologic process. Soft tissues and spinal canal: No prevertebral fluid or swelling. No visible canal hematoma. Disc levels:  No acute findings.  Degenerative changes. Upper chest: Negative. Other: None IMPRESSION: 1. No acute intracranial pathology. Mild chronic microvascular ischemic changes. 2. No acute/traumatic cervical spine pathology. Electronically Signed   By: Anner Crete M.D.   On: 05/31/2022 17:06   CT Cervical Spine Wo Contrast  Result Date: 05/31/2022 CLINICAL DATA:  Numbness in the left arm. EXAM: CT HEAD WITHOUT CONTRAST CT CERVICAL SPINE WITHOUT CONTRAST TECHNIQUE: Multidetector CT imaging of the head and cervical spine was performed following the standard protocol without intravenous contrast. Multiplanar CT image reconstructions of the cervical spine were also generated. RADIATION DOSE REDUCTION: This exam was performed according to the departmental dose-optimization program which includes automated exposure control, adjustment of the mA and/or kV according to patient size and/or use of iterative reconstruction technique. COMPARISON:  None Available. FINDINGS: CT HEAD FINDINGS Brain: The ventricles and sulci appropriate size for patient's age. Mild periventricular and deep white matter chronic microvascular ischemic changes noted. There is no acute intracranial hemorrhage. No mass effect or midline shift. No extra-axial fluid collection. Vascular: No hyperdense vessel or unexpected calcification. Skull: Normal. Negative for fracture or focal lesion. Sinuses/Orbits: Mild mucoperiosteal thickening of paranasal sinuses. No air-fluid level. The mastoid air cells are clear. Other: None CT CERVICAL SPINE FINDINGS Alignment: Normal. Skull base  and  vertebrae: No acute fracture. No primary bone lesion or focal pathologic process. Soft tissues and spinal canal: No prevertebral fluid or swelling. No visible canal hematoma. Disc levels:  No acute findings.  Degenerative changes. Upper chest: Negative. Other: None IMPRESSION: 1. No acute intracranial pathology. Mild chronic microvascular ischemic changes. 2. No acute/traumatic cervical spine pathology. Electronically Signed   By: Anner Crete M.D.   On: 05/31/2022 17:06   DG Chest 2 View  Result Date: 05/31/2022 CLINICAL DATA:  cp EXAM: CHEST - 2 VIEW COMPARISON:  04/05/2021 FINDINGS: Cardiac silhouette is unremarkable. No pneumothorax or pleural effusion. The lungs are clear. Moderate-sized hiatal hernia is stable finding. There are thoracic degenerative changes. IMPRESSION: No acute cardiopulmonary process. Electronically Signed   By: Sammie Bench M.D.   On: 05/31/2022 16:11     PHYSICAL EXAM  Temp:  [97.4 F (36.3 C)-98.4 F (36.9 C)] 98.2 F (36.8 C) (03/06 1500) Pulse Rate:  [41-90] 88 (03/06 1500) Resp:  [13-19] 18 (03/06 1500) BP: (111-164)/(52-95) 128/77 (03/06 1500) SpO2:  [96 %-100 %] 100 % (03/06 1500) Weight:  [101.9 kg] 101.9 kg (03/06 0412)  General - Well nourished, well developed, in no apparent distress.  Ophthalmologic - fundi not visualized due to noncooperation.  Cardiovascular - Regular rhythm and rate, not in A-fib.  Mental Status -  Level of arousal and orientation to time, place, and person were intact. Language including expression, naming, repetition, comprehension was assessed and found intact. Fund of Knowledge was assessed and was intact.  Cranial Nerves II - XII - II - Visual field intact OU. III, IV, VI - Extraocular movements intact. V - Facial sensation intact bilaterally. VII - Facial movement intact bilaterally. VIII - Hearing & vestibular intact bilaterally. X - Palate elevates symmetrically. XI - Chin turning & shoulder shrug  intact bilaterally. XII - Tongue protrusion intact.  Motor Strength - The patient's strength was normal in all extremities and pronator drift was absent.  Bulk was normal and fasciculations were absent.   Motor Tone - Muscle tone was assessed at the neck and appendages and was normal.  Reflexes - The patient's reflexes were symmetrical in all extremities and he had no pathological reflexes.  Sensory - Light touch, temperature/pinprick were assessed and were symmetrical.    Coordination - The patient had normal movements in the hands and feet with no ataxia or dysmetria.  Tremor was absent.  Gait and Station - deferred.   ASSESSMENT/PLAN Joshua Li is a 62 y.o. male with history of A-fib not on AC, HLD, snoring admitted for left arm numbness. No tPA given due to minimal symptoms and outside window.    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 not on AC CT no acute abnormality CTA head and neck unremarkable MRI right posterior central gyrus punctate infarct 2D Echo EF 55 to 60% LDL 69 HgbA1c 5.4 Lovenox for VTE prophylaxis aspirin 81 mg daily prior to admission, now on Eliquis (apixaban) daily for stroke prevention Patient counseled to be compliant with his antithrombotic medications Ongoing aggressive stroke risk factor management Therapy recommendations: None Disposition: Home  PAF History of PAF, not on AC due to low CHA2DS2-VASc score With current stroke, started on Eliquis Continue Eliquis on discharge  Hypertension Stable Long term BP goal normotensive  Hyperlipidemia Home meds: Crestor 20 LDL 69, goal < 70 Now continue Crestor 20 Continue statin at discharge  Other Stroke Risk Factors Advanced age Possible obstructive sleep apnea, recommend  outpatient testing  Other Active Problems AKI, creatinine 1.24-1.33  Hospital day # 0  Neurology will sign off. Please call with questions. Pt will follow up with stroke  clinic NP at [facility] in about 4 weeks. Thanks for the consult.   Rosalin Hawking, MD PhD Stroke Neurology 06/01/2022 6:58 PM    To contact Stroke Continuity provider, please refer to [email or web address] After hours, contact General Neurology

## 2022-06-01 NOTE — Progress Notes (Signed)
Echocardiogram 2D Echocardiogram has been performed.  Joshua Li 06/01/2022, 4:31 PM

## 2022-06-01 NOTE — Plan of Care (Signed)

## 2022-06-01 NOTE — Evaluation (Signed)
Physical Therapy Evaluation & Discharge  Patient Details Name: Joshua Li MRN: [id number] DOB: 07/29/1960 Today's Date: 06/01/2022  History of Present Illness  Pt is a 62 y.o. male who presented 05/31/22 with L arm numbness. MRI showed punctate acute to subacute cortical infarct in the right postcentral  gyrus. PMH: A-fib, asthma, GERD, heart murmur, HLD   Clinical Impression  Pt presents with condition above. PTA, he was independent without AD, working, driving, and living with his wife in a 1-level house with a level entry. Currently, pt appears and reports to be at his baseline, demonstrating WFL, intact, and symmetrical bil lower extremity strength, sensation, and coordination along with [facility] balance and gait. Pt scored a 24/24 on the DGI, further supporting his good stability with mobility. All education completed and questions answered. PT will sign off.     Recommendations for follow up therapy are one component of a multi-disciplinary discharge planning process, led by the attending physician.  Recommendations may be updated based on patient status, additional functional criteria and insurance authorization.  Follow Up Recommendations No PT follow up      Assistance Recommended at Discharge None  Patient can return home with the following       Equipment Recommendations None recommended by PT  Recommendations for Other Services       Functional Status Assessment Patient has not had a recent decline in their functional status     Precautions / Restrictions Precautions Precautions: None Restrictions Weight Bearing Restrictions: No      Mobility  Bed Mobility Overal bed mobility: Independent             General bed mobility comments: No assistance needed, bed flat, no use of rails.    Transfers Overall transfer level: Independent Equipment used: None               General transfer comment: No LOB, no assistance needed     Ambulation/Gait Ambulation/Gait assistance: Independent Gait Distance (Feet): 200 Feet Assistive device: None Gait Pattern/deviations: WFL(Within Functional Limits) Gait velocity: WFL Gait velocity interpretation: >4.37 ft/sec, indicative of normal walking speed   General Gait Details: No significant gait deviations noted and no LOB, even with DGI challenges.  Stairs Stairs: Yes Stairs assistance: Independent Stair Management: No rails, Alternating pattern, Forwards Number of Stairs: 2 General stair comments: Ascends and descends stairs without LOB or UE support, reciprocal stepping pattern.  Wheelchair Mobility    Modified Rankin (Stroke Patients Only) Modified Rankin (Stroke Patients Only) Pre-Morbid Rankin Score: No symptoms Modified Rankin: No symptoms     Balance Overall balance assessment: No apparent balance deficits (not formally assessed)                               Standardized Balance Assessment Standardized Balance Assessment : Dynamic Gait Index   Dynamic Gait Index Level Surface: Normal Change in Gait Speed: Normal Gait with Horizontal Head Turns: Normal Gait with Vertical Head Turns: Normal Gait and Pivot Turn: Normal Step Over Obstacle: Normal Step Around Obstacles: Normal Steps: Normal Total Score: 24       Pertinent Vitals/Pain Pain Assessment Pain Assessment: No/denies pain    Home Living Family/patient expects to be discharged to:: Private residence Living Arrangements: Spouse/significant other Available Help at Discharge: Family;Available PRN/intermittently Type of Home: House Home Access: Level entry       Home Layout: One level Home Equipment: Grab bars - tub/shower;Grab bars - toilet;Shower  seat - built in;Hand held shower head      Prior Function Prior Level of Function : Independent/Modified Independent;Driving;Working/employed             Mobility Comments: No AD, no falls ADLs Comments: 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[employer], sedentary job     Engineer, manufacturing [employer]   Dominant Hand: Right    Extremity/Trunk Assessment   Upper Extremity Assessment Upper Extremity Assessment: Defer to OT evaluation    Lower Extremity Assessment Lower Extremity Assessment: Overall WFL for tasks assessed (MMT scores of 5 grossly bil, coordination and sensation intact bil)    Cervical / Trunk Assessment Cervical / Trunk Assessment: Normal  Communication   Communication: No difficulties  Cognition Arousal/Alertness: Awake/alert Behavior During Therapy: WFL for tasks assessed/performed Overall Cognitive Status: Within Functional Limits for tasks assessed                                 General Comments: A&Ox4 and follows commands appropriately and quickly        General Comments General comments (skin integrity, edema, etc.): Educated pt on "BE FAST"    Exercises     Assessment/Plan    PT Assessment Patient does not need any further PT services  PT Problem List         PT Treatment Interventions      PT Goals (Current goals can be found in the Care Plan section)  Acute Rehab PT Goals Patient Stated Goal: to go home PT Goal Formulation: All assessment and education complete, DC therapy Time For Goal Achievement: 06/02/22 Potential to Achieve Goals: Good    Frequency       Co-evaluation               AM-PAC PT "6 Clicks" Mobility  Outcome Measure Help needed turning from your back to your side while in a flat bed without using bedrails?: None Help needed moving from lying on your back to sitting on the side of a flat bed without using bedrails?: None Help needed moving to and from a bed to a chair (including a wheelchair)?: None Help needed standing up from a chair using your arms (e.g., wheelchair or bedside chair)?: None Help needed to walk in hospital room?: None Help needed climbing 3-5 steps with a railing? : None 6 Click Score: 24    End of  Session Equipment Utilized During Treatment: Gait belt Activity Tolerance: Patient tolerated treatment well Patient left: in bed;with call bell/phone within reach Nurse Communication: Mobility status PT Visit Diagnosis: Other symptoms and signs involving the nervous system (R29.898)    [id number] PT Time Calculation (min) (ACUTE ONLY): 21 min   Charges:   PT Evaluation $PT Eval Low Complexity: 1 Low          Moishe Spice, PT, DPT Acute Rehabilitation Services  Office: [phone]   Orvan Falconer 06/01/2022, 9:14 AM

## 2022-06-01 NOTE — ED Notes (Signed)
ED TO INPATIENT HANDOFF REPORT  ED Nurse Name and Phone #: Inda Merlin [phone]  S Name/Age/Gender Joshua Li 62 y.o. male Room/Bed: 004C/004C  Code Status   Code Status: Full Code  Home/SNF/Other Home Patient oriented to: self, place, time, and situation Is this baseline? Yes   Triage Complete: Triage complete  Chief Complaint Acute CVA (cerebrovascular accident) [facility]) [I63.9]  Triage Note Pt c/o numbness in L arm, onset after lunch. Endorses hx afib, "pretty much in check," but states last night had racing heart. Denies CP, N/V. States he called cardiologist, spoke w triage RN there, advised to come to ED for further workup.    Allergies No Known Allergies  Level of Care/Admitting Diagnosis ED Disposition     ED Disposition  Admit   Condition  --   Comment  Hospital Area: [facility] [100100]  Level of Care: Telemetry Medical [104]  May place patient in observation at [facility] or [place] if equivalent level of care is available:: Yes  Covid Evaluation: Asymptomatic - no recent exposure (last 10 days) testing not required  Diagnosis: Acute CVA (cerebrovascular accident) [facility]) [id number]  Admitting Physician: Shela Leff [id number]  Attending Physician: Shela Leff [id number]          B Medical/Surgery History Past Medical History:  Diagnosis Date   A-fib ([place])    Allergy    Asthma    GERD (gastroesophageal reflux disease)    Heart murmur    Hiatal hernia    Hyperlipidemia    Ulcerative colitis 2001   Status post total colectomy with J-pouch in 2003 -    Past Surgical History:  Procedure Laterality Date   CHOLECYSTECTOMY     pt not sure   COLECTOMY  2003   with one step take-down to J-pouch   COLECTOMY     COLECTOMY  2003   total colectomy with J pouch (one step procedure)   COLECTOMY  2003   total colectomy with j pouch ( one step)   TONSILLECTOMY     TOTAL COLECTOMY  2003   total colectomy with J pouch       A IV Location/Drains/Wounds Patient Lines/Drains/Airways Status     Active Line/Drains/Airways     Name Placement date Placement time Site Days   Peripheral IV 05/31/22 20 G Anterior;Distal;Right;Upper Arm 05/31/22  1621  Arm  1            Intake/Output Last 24 hours No intake or output data in the 24 hours ending 06/01/22 0139  Labs/Imaging Results for orders placed or performed during the hospital encounter of 05/31/22 (from the past 48 hour(s))  Basic metabolic panel     Status: Abnormal   Collection Time: 05/31/22  4:05 PM  Result Value Ref Range   Sodium 140 135 - 145 mmol/L   Potassium 4.1 3.5 - 5.1 mmol/L   Chloride 108 98 - 111 mmol/L   CO2 23 22 - 32 mmol/L   Glucose, Bld 119 (H) 70 - 99 mg/dL    Comment: Glucose reference range applies only to samples taken after fasting for at least 8 hours.   BUN 17 8 - 23 mg/dL   Creatinine, Ser 1.24 0.61 - 1.24 mg/dL   Calcium 8.3 (L) 8.9 - 10.3 mg/dL   GFR, Estimated >60 >60 mL/min    Comment: (NOTE) Calculated using the CKD-EPI Creatinine Equation (2021)    Anion gap 9 5 - 15    Comment: Performed at [facility], [place], [place], [place] [address]  CBC     Status: None   Collection Time: 05/31/22  4:25 PM  Result Value Ref Range   WBC 8.4 4.0 - 10.5 K/uL   RBC 5.07 4.22 - 5.81 MIL/uL   Hemoglobin 14.9 13.0 - 17.0 g/dL   HCT 42.1 39.0 - 52.0 %   MCV 83.0 80.0 - 100.0 fL   MCH 29.4 26.0 - 34.0 pg   MCHC 35.4 30.0 - 36.0 g/dL   RDW 13.6 11.5 - 15.5 %   Platelets 238 150 - 400 K/uL   nRBC 0.0 0.0 - 0.2 %    Comment: Performed at [facility], [address] [place], [place] [address]  Troponin I (High Sensitivity)     Status: None   Collection Time: 05/31/22  4:34 PM  Result Value Ref Range   Troponin I (High Sensitivity) 4 <18 ng/L    Comment: (NOTE) Elevated high sensitivity troponin I (hsTnI) values and significant  changes across serial measurements may  suggest ACS but many other  chronic and acute conditions are known to elevate hsTnI results.  Refer to the "Links" section for chest pain algorithms and additional  guidance. Performed at [facility], [address], [place], [place] [address]   Lipid panel     Status: None   Collection Time: 06/01/22 12:25 AM  Result Value Ref Range   Cholesterol 133 0 - 200 mg/dL   Triglycerides 84 <150 mg/dL   HDL 47 >40 mg/dL   Total CHOL/HDL Ratio 2.8 RATIO   VLDL 17 0 - 40 mg/dL   LDL Cholesterol 69 0 - 99 mg/dL    Comment:        Total Cholesterol/HDL:CHD Risk Coronary Heart Disease Risk Table                     Men   Women  1/2 Average Risk   3.4   3.3  Average Risk       5.0   4.4  2 X Average Risk   9.6   7.1  3 X Average Risk  23.4   11.0        Use the calculated Patient Ratio above and the CHD Risk Table to determine the patient's CHD Risk.        ATP III CLASSIFICATION (LDL):  <100     mg/dL   Optimal  100-129  mg/dL   Near or Above                    Optimal  130-159  mg/dL   Borderline  160-189  mg/dL   High  >190     mg/dL   Very High Performed at [facility] [address] [place], [place] [address]   Comprehensive metabolic panel     Status: Abnormal   Collection Time: 06/01/22 12:25 AM  Result Value Ref Range   Sodium 140 135 - 145 mmol/L   Potassium 3.5 3.5 - 5.1 mmol/L   Chloride 110 98 - 111 mmol/L   CO2 22 22 - 32 mmol/L   Glucose, Bld 115 (H) 70 - 99 mg/dL    Comment: Glucose reference range applies only to samples taken after fasting for at least 8 hours.   BUN 15 8 - 23 mg/dL   Creatinine, Ser 1.33 (H) 0.61 - 1.24 mg/dL   Calcium 8.1 (L) 8.9 - 10.3 mg/dL  Total Protein 6.0 (L) 6.5 - 8.1 g/dL   Albumin 3.3 (L) 3.5 - 5.0 g/dL   AST 22 15 - 41 U/L   ALT 18 0 - 44 U/L   Alkaline Phosphatase 41 38 - 126 U/L   Total Bilirubin 0.8 0.3 - 1.2 mg/dL   GFR, Estimated >60 >60 mL/min    Comment: (NOTE) Calculated using the CKD-EPI  Creatinine Equation (2021)    Anion gap 8 5 - 15    Comment: Performed at [facility] [address] [place],  [address]   MR BRAIN WO CONTRAST  Result Date: 05/31/2022 CLINICAL DATA:  Transient ischemic attack EXAM: MRI HEAD WITHOUT CONTRAST TECHNIQUE: Multiplanar, multiecho pulse sequences of the brain and surrounding structures were obtained without intravenous contrast. COMPARISON:  No prior MRI available, correlation is made with CT head 05/31/2022 FINDINGS: Brain: Punctate focus of restricted diffusion with ADC correlate in the right postcentral gyrus (series 3, image 43), consistent with acute to subacute infarct. This area is associated with mildly increased T2 hyperintense signal. No acute hemorrhage, mass, mass effect, or midline shift. No hydrocephalus or extra-axial collection. No hemosiderin deposition to suggest remote hemorrhage. Vascular: Normal arterial flow voids. Skull and upper cervical spine: Normal marrow signal. Sinuses/Orbits: Mucosal thickening in the ethmoid air cells and right maxillary sinus. No acute finding in the orbits. Other: The mastoids are well aerated. IMPRESSION: Punctate acute to subacute cortical infarct in the right postcentral gyrus. These results were called by telephone at the time of interpretation on 05/31/2022 at 9:52 pm to provider [facility] , who verbally acknowledged these results. Electronically Signed   By: Merilyn Baba M.D.   On: 05/31/2022 21:54   CT ANGIO HEAD NECK W WO CM  Result Date: 05/31/2022 CLINICAL DATA:  Left arm numbness EXAM: CT ANGIOGRAPHY HEAD AND NECK TECHNIQUE: Multidetector CT imaging of the head and neck was performed using the standard protocol during bolus administration of intravenous contrast. Multiplanar CT image reconstructions and MIPs were obtained to evaluate the vascular anatomy. Carotid stenosis measurements (when applicable) are obtained utilizing NASCET criteria, using the distal internal carotid diameter as  the denominator. RADIATION DOSE REDUCTION: This exam was performed according to the departmental dose-optimization program which includes automated exposure control, adjustment of the mA and/or kV according to patient size and/or use of iterative reconstruction technique. CONTRAST:  74m OMNIPAQUE IOHEXOL 350 MG/ML SOLN COMPARISON:  No prior CTA available, correlation is made with CT head 05/31/2022 FINDINGS: CT HEAD FINDINGS For noncontrast findings, please see same day CT head. CTA NECK FINDINGS Aortic arch: Standard branching. Imaged portion shows no evidence of aneurysm or dissection. No significant stenosis of the major arch vessel origins. Right carotid system: No evidence of stenosis, dissection, or occlusion. Left carotid system: No evidence of stenosis, dissection, or occlusion. Vertebral arteries: No evidence of stenosis, dissection, or occlusion. Skeleton: No acute osseous abnormality. Other neck: No acute finding. Upper chest: No focal pulmonary opacity or pleural effusion. Review of the MIP images confirms the above findings CTA HEAD FINDINGS Anterior circulation: Both internal carotid arteries are patent to the termini, without significant stenosis. A1 segments patent. Normal anterior communicating artery. Anterior cerebral arteries are patent to their distal aspects. No M1 stenosis or occlusion. MCA branches perfused and symmetric. Posterior circulation: Vertebral arteries patent to the vertebrobasilar junction without stenosis. Posterior inferior cerebellar arteries patent proximally. Basilar patent to its distal aspect. Superior cerebellar arteries patent proximally. Patent P1 segments. PCAs perfused to their distal aspects  without stenosis. The bilateral posterior communicating arteries are patent. Venous sinuses: As permitted by contrast timing, patent. Anatomic variants: None significant. Review of the MIP images confirms the above findings IMPRESSION: 1. No intracranial large vessel occlusion or  significant stenosis. 2. No hemodynamically significant stenosis in the neck. Electronically Signed   By: Merilyn Baba M.D.   On: 05/31/2022 19:06   CT Head Wo Contrast  Result Date: 05/31/2022 CLINICAL DATA:  Numbness in the left arm. EXAM: CT HEAD WITHOUT CONTRAST CT CERVICAL SPINE WITHOUT CONTRAST TECHNIQUE: Multidetector CT imaging of the head and cervical spine was performed following the standard protocol without intravenous contrast. Multiplanar CT image reconstructions of the cervical spine were also generated. RADIATION DOSE REDUCTION: This exam was performed according to the departmental dose-optimization program which includes automated exposure control, adjustment of the mA and/or kV according to patient size and/or use of iterative reconstruction technique. COMPARISON:  None Available. FINDINGS: CT HEAD FINDINGS Brain: The ventricles and sulci appropriate size for patient's age. Mild periventricular and deep white matter chronic microvascular ischemic changes noted. There is no acute intracranial hemorrhage. No mass effect or midline shift. No extra-axial fluid collection. Vascular: No hyperdense vessel or unexpected calcification. Skull: Normal. Negative for fracture or focal lesion. Sinuses/Orbits: Mild mucoperiosteal thickening of paranasal sinuses. No air-fluid level. The mastoid air cells are clear. Other: None CT CERVICAL SPINE FINDINGS Alignment: Normal. Skull base and vertebrae: No acute fracture. No primary bone lesion or focal pathologic process. Soft tissues and spinal canal: No prevertebral fluid or swelling. No visible canal hematoma. Disc levels:  No acute findings.  Degenerative changes. Upper chest: Negative. Other: None IMPRESSION: 1. No acute intracranial pathology. Mild chronic microvascular ischemic changes. 2. No acute/traumatic cervical spine pathology. Electronically Signed   By: Anner Crete M.D.   On: 05/31/2022 17:06   CT Cervical Spine Wo Contrast  Result Date:  05/31/2022 CLINICAL DATA:  Numbness in the left arm. EXAM: CT HEAD WITHOUT CONTRAST CT CERVICAL SPINE WITHOUT CONTRAST TECHNIQUE: Multidetector CT imaging of the head and cervical spine was performed following the standard protocol without intravenous contrast. Multiplanar CT image reconstructions of the cervical spine were also generated. RADIATION DOSE REDUCTION: This exam was performed according to the departmental dose-optimization program which includes automated exposure control, adjustment of the mA and/or kV according to patient size and/or use of iterative reconstruction technique. COMPARISON:  None Available. FINDINGS: CT HEAD FINDINGS Brain: The ventricles and sulci appropriate size for patient's age. Mild periventricular and deep white matter chronic microvascular ischemic changes noted. There is no acute intracranial hemorrhage. No mass effect or midline shift. No extra-axial fluid collection. Vascular: No hyperdense vessel or unexpected calcification. Skull: Normal. Negative for fracture or focal lesion. Sinuses/Orbits: Mild mucoperiosteal thickening of paranasal sinuses. No air-fluid level. The mastoid air cells are clear. Other: None CT CERVICAL SPINE FINDINGS Alignment: Normal. Skull base and vertebrae: No acute fracture. No primary bone lesion or focal pathologic process. Soft tissues and spinal canal: No prevertebral fluid or swelling. No visible canal hematoma. Disc levels:  No acute findings.  Degenerative changes. Upper chest: Negative. Other: None IMPRESSION: 1. No acute intracranial pathology. Mild chronic microvascular ischemic changes. 2. No acute/traumatic cervical spine pathology. Electronically Signed   By: Anner Crete M.D.   On: 05/31/2022 17:06   DG Chest 2 View  Result Date: 05/31/2022 CLINICAL DATA:  cp EXAM: CHEST - 2 VIEW COMPARISON:  04/05/2021 FINDINGS: Cardiac silhouette is unremarkable. No pneumothorax or pleural effusion. The lungs are  clear. Moderate-sized hiatal  hernia is stable finding. There are thoracic degenerative changes. IMPRESSION: No acute cardiopulmonary process. Electronically Signed   By: Sammie Bench M.D.   On: 05/31/2022 16:11    Pending Labs Unresulted Labs (From admission, onward)     Start     Ordered   06/01/22 0009  HIV Antibody (routine testing w rflx)  (HIV Antibody (Routine testing w reflex) panel)  Once,   R        06/01/22 0011            Vitals/Pain Today's Vitals   05/31/22 2003 05/31/22 2003 05/31/22 2316 06/01/22 0015  BP:  (!) 120/95 118/86 (!) 164/52  Pulse:  (!) 50 80 (!) 43  Resp:  '16 16 13  '$ Temp:  98.2 F (36.8 C)  98.1 F (36.7 C)  TempSrc:  Oral    SpO2:  100% 100% 98%  PainSc: 0-No pain       Isolation Precautions No active isolations  Medications Medications  aspirin chewable tablet 81 mg (has no administration in time range)  flecainide (TAMBOCOR) tablet 50 mg (50 mg Oral Given 06/01/22 0124)  rosuvastatin (CRESTOR) tablet 20 mg (has no administration in time range)  pantoprazole (PROTONIX) EC tablet 40 mg (has no administration in time range)  albuterol (PROVENTIL) (2.5 MG/3ML) 0.083% nebulizer solution 2.5 mg (has no administration in time range)   stroke: early stages of recovery book (has no administration in time range)  acetaminophen (TYLENOL) tablet 650 mg (has no administration in time range)    Or  acetaminophen (TYLENOL) 160 MG/5ML solution 650 mg (has no administration in time range)    Or  acetaminophen (TYLENOL) suppository 650 mg (has no administration in time range)  enoxaparin (LOVENOX) injection 40 mg (has no administration in time range)  iohexol (OMNIPAQUE) 350 MG/ML injection 100 mL (75 mLs Intravenous Contrast Given 05/31/22 1820)    Mobility walks     Focused Assessments Neuro Assessment Handoff:  Swallow screen pass? Yes    NIH Stroke Scale  Dizziness Present: No Headache Present: No Interval: Shift assessment Level of Consciousness (1a.)   : Alert,  keenly responsive LOC Questions (1b. )   : Answers both questions correctly LOC Commands (1c. )   : Performs both tasks correctly Best Gaze (2. )  : Normal Visual (3. )  : No visual loss Facial Palsy (4. )    : Normal symmetrical movements Motor Arm, Left (5a. )   : No drift Motor Arm, Right (5b. ) : No drift Motor Leg, Left (6a. )  : No drift Motor Leg, Right (6b. ) : No drift Limb Ataxia (7. ): Absent Sensory (8. )  : Normal, no sensory loss Best Language (9. )  : No aphasia Dysarthria (10. ): Normal Extinction/Inattention (11.)   : No Abnormality Complete NIHSS TOTAL: 0     Neuro Assessment:   Neuro Checks:   Shift assessment (06/01/22 0018)  Has TPA been given? No If patient is a Neuro Trauma and patient is going to OR before floor call report to [facility] nurse: [phone] or [phone]   R Recommendations: See Admitting Provider Note  Report given to:   Additional Notes: Very nice patient, ambulatory w/ no assist comes in to see [place] for MRI. Shows TIA or possible small stroke. Here for neuro. Swallow screen passed, no deficits. Sinus brady, room air, blood pressures stable.

## 2022-06-03 NOTE — Discharge Summary (Signed)
Physician Discharge Summary   Patient: Joshua Li MRN: [id number] DOB: 03-30-1960  Admit date:     05/31/2022  Discharge date: 06/01/2022  Discharge Physician: Berle Mull  PCP: Tawnya Crook, MD  Recommendations at discharge: Follow-up with PCP and neurology as recommended.   Follow-up Information     Tawnya Crook, MD. Schedule an appointment as soon as possible for a visit in 1 week(s).   Specialty: Family Medicine Contact information: [place] [address] [phone]         [place]. Schedule an appointment as soon as possible for a visit in 1 month(s).   Why: stroke clinic Contact information: [address]     [place] [phone]               Discharge Diagnoses: Principal Problem:   Acute CVA (cerebrovascular accident) [facility]) Active Problems:   Paroxysmal atrial fibrillation (HCC)   Asthma, chronic   HLD (hyperlipidemia)   GERD (gastroesophageal reflux disease)   Sinus bradycardia   Ulcerative colitis ([place])  Assessment and Plan Acute right punctate posterior central gyrus infarct embolic secondary to PAF not on [facility] Presents with left arm numbness.  Symptoms currently resolved.  Did not receive any tPA as patient was outside of the window.. CT of the head no acute abnormality CTA head and neck unremarkable for any large vessel occlusion MRI brain right posterior central gyrus punctate infarct 2D Echo EF 55 to 60% LDL 69 on statin. HgbA1c 5.4 Aspirin 81 mg daily prior to admission, now on Eliquis (apixaban) daily for stroke prevention   PAF History of PAF, not on AC due to low CHA2DS2-VASc score With current stroke, started on Eliquis, currently rate controlled. Continue Eliquis on discharge   Hypertension Stable Long term BP goal normotensive   Hyperlipidemia Home meds: Crestor 20 LDL 69, goal < 70 Continue statin at discharge   Acute kidney  injury ruled out No evidence of AKI. Serum creatinine at his baseline around 1.21.3.  And remaining at the same level for now. Recommend outpatient follow-up with PCP.  Consultants:  Neurology  Procedures performed:  Echocardiogram  DISCHARGE MEDICATION: Allergies as of 06/01/2022   No Known Allergies      Medication List     STOP taking these medications    aspirin 81 MG tablet       TAKE these medications    albuterol 108 (90 Base) MCG/ACT inhaler Commonly known as: VENTOLIN HFA INHALE 2 PUFFS INTO THE LUNGS EVERY 6 (SIX) HOURS AS NEEDED FOR WHEEZING OR SHORTNESS OF BREATH.   apixaban 5 MG Tabs tablet Commonly known as: ELIQUIS Take 1 tablet (5 mg total) by mouth 2 (two) times daily.   cetirizine 10 MG tablet Commonly known as: ZYRTEC Take 10 mg by mouth daily.   flecainide 50 MG tablet Commonly known as: TAMBOCOR Take 1 tablet (50 mg total) by mouth 2 (two) times daily.   metoprolol succinate 25 MG 24 hr tablet Commonly known as: TOPROL-XL Take 1 tablet (25 mg total) by mouth daily. What changed:  medication strength how much to take   pantoprazole 40 MG tablet Commonly known as: PROTONIX Take 1 tablet (40 mg total) by mouth daily.   rosuvastatin 20 MG tablet Commonly known as: CRESTOR Take 1 tablet (20 mg total) by mouth daily. Annual appt due in January pt must see provider for future refills   Vitamin D 50 MCG (2000 UT) Caps  Take 2,000 Units by mouth daily.       Disposition: Home Diet recommendation: Cardiac diet  Discharge Exam: Vitals:   06/01/22 0412 06/01/22 0821 06/01/22 1130 06/01/22 1500  BP:  127/84 118/78 128/77  Pulse:  (!) 48 90 88  Resp:  '19 16 18  '$ Temp:  98 F (36.7 C) 98.4 F (36.9 C) 98.2 F (36.8 C)  TempSrc:  Oral Oral Oral  SpO2:  96% 98% 100%  Weight: 101.9 kg     Height: '6\' 3"'$  (1.905 m)      General: Appear in no distress; no visible Abnormal Neck Mass Or lumps, Conjunctiva normal Cardiovascular: S1 and S2  Present, no Murmur, Respiratory: good respiratory effort, Bilateral Air entry present and CTA, no Crackles, no wheezes Abdomen: Bowel Sound present, Non tender  Extremities: no Pedal edema Neurology: alert and oriented to time, place, and person  [employer] Weights   06/01/22 0412  Weight: 101.9 kg   Condition at discharge: stable  The results of significant diagnostics from this hospitalization (including imaging, microbiology, ancillary and laboratory) are listed below for reference.   Imaging Studies: ECHOCARDIOGRAM COMPLETE  Result Date: 06/01/2022    ECHOCARDIOGRAM REPORT   Patient Name:   Joshua Li Date of Exam: 06/01/2022 Medical Rec #:  [id number]     Height:       75.0 in Accession #:    [id number]    Weight:       224.6 lb Date of Birth:  [birth year]     BSA:          2.305 m Patient Age:    63 years      BP:           127/84 mmHg Patient Gender: M             HR:           56 bpm. Exam Location:  Inpatient Procedure: 2D Echo, Cardiac Doppler and Color Doppler Indications:    Stroke I63.9  History:        Patient has prior history of Echocardiogram examinations, most                 recent 03/16/2017. Stroke, Arrythmias:Tachycardia, Bradycardia                 and Atrial Fibrillation; Risk Factors:Dyslipidemia.  Sonographer:    Ronny Flurry Referring Phys: [id number] [place]  1. Left ventricular ejection fraction, by estimation, is 55 to 60%. The left ventricle has normal function. The left ventricle has no regional wall motion abnormalities. Left ventricular diastolic parameters are consistent with Grade I diastolic dysfunction (impaired relaxation).  2. Right ventricular systolic function is normal. The right ventricular size is normal. Tricuspid regurgitation signal is inadequate for assessing PA pressure.  3. The mitral valve is normal in structure. No evidence of mitral valve regurgitation. No evidence of mitral stenosis.  4. The aortic valve is tricuspid. Aortic valve  regurgitation is trivial. No aortic stenosis is present.  5. Aortic dilatation noted. There is mild dilatation of the aortic root, measuring 39 mm.  6. The inferior vena cava is dilated in size with >50% respiratory variability, suggesting right atrial pressure of 8 mmHg. FINDINGS  Left Ventricle: Left ventricular ejection fraction, by estimation, is 55 to 60%. The left ventricle has normal function. The left ventricle has no regional wall motion abnormalities. The left ventricular internal cavity size was normal in size. There is  no left ventricular  hypertrophy. Left ventricular diastolic parameters are consistent with Grade I diastolic dysfunction (impaired relaxation). Right Ventricle: The right ventricular size is normal. No increase in right ventricular wall thickness. Right ventricular systolic function is normal. Tricuspid regurgitation signal is inadequate for assessing PA pressure. Left Atrium: Left atrial size was normal in size. Right Atrium: Right atrial size was normal in size. Pericardium: There is no evidence of pericardial effusion. Mitral Valve: The mitral valve is normal in structure. No evidence of mitral valve regurgitation. No evidence of mitral valve stenosis. Tricuspid Valve: The tricuspid valve is normal in structure. Tricuspid valve regurgitation is not demonstrated. Aortic Valve: The aortic valve is tricuspid. Aortic valve regurgitation is trivial. No aortic stenosis is present. Aortic valve mean gradient measures 5.0 mmHg. Aortic valve peak gradient measures 9.7 mmHg. Aortic valve area, by VTI measures 3.09 cm. Pulmonic Valve: The pulmonic valve was normal in structure. Pulmonic valve regurgitation is not visualized. Aorta: Aortic dilatation noted. There is mild dilatation of the aortic root, measuring 39 mm. Venous: The inferior vena cava is dilated in size with greater than 50% respiratory variability, suggesting right atrial pressure of 8 mmHg. IAS/Shunts: No atrial level shunt  detected by color flow Doppler.  LEFT VENTRICLE PLAX 2D LVIDd:         4.80 cm   Diastology LVIDs:         3.20 cm   LV e' medial:    8.05 cm/s LV PW:         1.00 cm   LV E/e' medial:  11.6 LV IVS:        0.80 cm   LV e' lateral:   9.36 cm/s LVOT diam:     2.20 cm   LV E/e' lateral: 10.0 LV SV:         90 LV SV Index:   39 LVOT Area:     3.80 cm  RIGHT VENTRICLE             IVC RV S prime:     20.00 cm/s  IVC diam: 2.20 cm TAPSE (M-mode): 2.6 cm LEFT ATRIUM             Index        RIGHT ATRIUM           Index LA diam:        3.00 cm 1.30 cm/m   RA Area:     12.10 cm LA Vol (A2C):   20.4 ml 8.85 ml/m   RA Volume:   24.30 ml  10.54 ml/m LA Vol (A4C):   69.9 ml 30.32 ml/m LA Biplane Vol: 41.4 ml 17.96 ml/m  AORTIC VALVE AV Area (Vmax):    2.93 cm AV Area (Vmean):   2.86 cm AV Area (VTI):     3.09 cm AV Vmax:           155.50 cm/s AV Vmean:          100.000 cm/s AV VTI:            0.291 m AV Peak Grad:      9.7 mmHg AV Mean Grad:      5.0 mmHg LVOT Vmax:         119.67 cm/s LVOT Vmean:        75.367 cm/s LVOT VTI:          0.237 m LVOT/AV VTI ratio: 0.81  AORTA Ao Root diam: 3.90 cm MITRAL VALVE MV Area (PHT): 3.48 cm  SHUNTS MV Decel Time: 218 msec    Systemic VTI:  0.24 m MV E velocity: 93.40 cm/s  Systemic Diam: 2.20 cm MV A velocity: 93.40 cm/s MV E/A ratio:  1.00 Dalton McleanMD Electronically signed by Franki Monte Signature Date/Time: 06/01/2022/5:51:25 PM    Final    MR BRAIN WO CONTRAST  Result Date: 05/31/2022 CLINICAL DATA:  Transient ischemic attack EXAM: MRI HEAD WITHOUT CONTRAST TECHNIQUE: Multiplanar, multiecho pulse sequences of the brain and surrounding structures were obtained without intravenous contrast. COMPARISON:  No prior MRI available, correlation is made with CT head 05/31/2022 FINDINGS: Brain: Punctate focus of restricted diffusion with ADC correlate in the right postcentral gyrus (series 3, image 43), consistent with acute to subacute infarct. This area is associated with  mildly increased T2 hyperintense signal. No acute hemorrhage, mass, mass effect, or midline shift. No hydrocephalus or extra-axial collection. No hemosiderin deposition to suggest remote hemorrhage. Vascular: Normal arterial flow voids. Skull and upper cervical spine: Normal marrow signal. Sinuses/Orbits: Mucosal thickening in the ethmoid air cells and right maxillary sinus. No acute finding in the orbits. Other: The mastoids are well aerated. IMPRESSION: Punctate acute to subacute cortical infarct in the right postcentral gyrus. These results were called by telephone at the time of interpretation on 05/31/2022 at 9:52 pm to provider [facility] , who verbally acknowledged these results. Electronically Signed   By: Merilyn Baba M.D.   On: 05/31/2022 21:54   CT ANGIO HEAD NECK W WO CM  Result Date: 05/31/2022 CLINICAL DATA:  Left arm numbness EXAM: CT ANGIOGRAPHY HEAD AND NECK TECHNIQUE: Multidetector CT imaging of the head and neck was performed using the standard protocol during bolus administration of intravenous contrast. Multiplanar CT image reconstructions and MIPs were obtained to evaluate the vascular anatomy. Carotid stenosis measurements (when applicable) are obtained utilizing NASCET criteria, using the distal internal carotid diameter as the denominator. RADIATION DOSE REDUCTION: This exam was performed according to the departmental dose-optimization program which includes automated exposure control, adjustment of the mA and/or kV according to patient size and/or use of iterative reconstruction technique. CONTRAST:  57m OMNIPAQUE IOHEXOL 350 MG/ML SOLN COMPARISON:  No prior CTA available, correlation is made with CT head 05/31/2022 FINDINGS: CT HEAD FINDINGS For noncontrast findings, please see same day CT head. CTA NECK FINDINGS Aortic arch: Standard branching. Imaged portion shows no evidence of aneurysm or dissection. No significant stenosis of the major arch vessel origins. Right carotid system: No  evidence of stenosis, dissection, or occlusion. Left carotid system: No evidence of stenosis, dissection, or occlusion. Vertebral arteries: No evidence of stenosis, dissection, or occlusion. Skeleton: No acute osseous abnormality. Other neck: No acute finding. Upper chest: No focal pulmonary opacity or pleural effusion. Review of the MIP images confirms the above findings CTA HEAD FINDINGS Anterior circulation: Both internal carotid arteries are patent to the termini, without significant stenosis. A1 segments patent. Normal anterior communicating artery. Anterior cerebral arteries are patent to their distal aspects. No M1 stenosis or occlusion. MCA branches perfused and symmetric. Posterior circulation: Vertebral arteries patent to the vertebrobasilar junction without stenosis. Posterior inferior cerebellar arteries patent proximally. Basilar patent to its distal aspect. Superior cerebellar arteries patent proximally. Patent P1 segments. PCAs perfused to their distal aspects without stenosis. The bilateral posterior communicating arteries are patent. Venous sinuses: As permitted by contrast timing, patent. Anatomic variants: None significant. Review of the MIP images confirms the above findings IMPRESSION: 1. No intracranial large vessel occlusion or significant stenosis. 2. No hemodynamically significant stenosis in  the neck. Electronically Signed   By: Merilyn Baba M.D.   On: 05/31/2022 19:06   CT Head Wo Contrast  Result Date: 05/31/2022 CLINICAL DATA:  Numbness in the left arm. EXAM: CT HEAD WITHOUT CONTRAST CT CERVICAL SPINE WITHOUT CONTRAST TECHNIQUE: Multidetector CT imaging of the head and cervical spine was performed following the standard protocol without intravenous contrast. Multiplanar CT image reconstructions of the cervical spine were also generated. RADIATION DOSE REDUCTION: This exam was performed according to the departmental dose-optimization program which includes automated exposure control,  adjustment of the mA and/or kV according to patient size and/or use of iterative reconstruction technique. COMPARISON:  None Available. FINDINGS: CT HEAD FINDINGS Brain: The ventricles and sulci appropriate size for patient's age. Mild periventricular and deep white matter chronic microvascular ischemic changes noted. There is no acute intracranial hemorrhage. No mass effect or midline shift. No extra-axial fluid collection. Vascular: No hyperdense vessel or unexpected calcification. Skull: Normal. Negative for fracture or focal lesion. Sinuses/Orbits: Mild mucoperiosteal thickening of paranasal sinuses. No air-fluid level. The mastoid air cells are clear. Other: None CT CERVICAL SPINE FINDINGS Alignment: Normal. Skull base and vertebrae: No acute fracture. No primary bone lesion or focal pathologic process. Soft tissues and spinal canal: No prevertebral fluid or swelling. No visible canal hematoma. Disc levels:  No acute findings.  Degenerative changes. Upper chest: Negative. Other: None IMPRESSION: 1. No acute intracranial pathology. Mild chronic microvascular ischemic changes. 2. No acute/traumatic cervical spine pathology. Electronically Signed   By: Anner Crete M.D.   On: 05/31/2022 17:06   CT Cervical Spine Wo Contrast  Result Date: 05/31/2022 CLINICAL DATA:  Numbness in the left arm. EXAM: CT HEAD WITHOUT CONTRAST CT CERVICAL SPINE WITHOUT CONTRAST TECHNIQUE: Multidetector CT imaging of the head and cervical spine was performed following the standard protocol without intravenous contrast. Multiplanar CT image reconstructions of the cervical spine were also generated. RADIATION DOSE REDUCTION: This exam was performed according to the departmental dose-optimization program which includes automated exposure control, adjustment of the mA and/or kV according to patient size and/or use of iterative reconstruction technique. COMPARISON:  None Available. FINDINGS: CT HEAD FINDINGS Brain: The ventricles and  sulci appropriate size for patient's age. Mild periventricular and deep white matter chronic microvascular ischemic changes noted. There is no acute intracranial hemorrhage. No mass effect or midline shift. No extra-axial fluid collection. Vascular: No hyperdense vessel or unexpected calcification. Skull: Normal. Negative for fracture or focal lesion. Sinuses/Orbits: Mild mucoperiosteal thickening of paranasal sinuses. No air-fluid level. The mastoid air cells are clear. Other: None CT CERVICAL SPINE FINDINGS Alignment: Normal. Skull base and vertebrae: No acute fracture. No primary bone lesion or focal pathologic process. Soft tissues and spinal canal: No prevertebral fluid or swelling. No visible canal hematoma. Disc levels:  No acute findings.  Degenerative changes. Upper chest: Negative. Other: None IMPRESSION: 1. No acute intracranial pathology. Mild chronic microvascular ischemic changes. 2. No acute/traumatic cervical spine pathology. Electronically Signed   By: Anner Crete M.D.   On: 05/31/2022 17:06   DG Chest 2 View  Result Date: 05/31/2022 CLINICAL DATA:  cp EXAM: CHEST - 2 VIEW COMPARISON:  04/05/2021 FINDINGS: Cardiac silhouette is unremarkable. No pneumothorax or pleural effusion. The lungs are clear. Moderate-sized hiatal hernia is stable finding. There are thoracic degenerative changes. IMPRESSION: No acute cardiopulmonary process. Electronically Signed   By: Sammie Bench M.D.   On: 05/31/2022 16:11    Microbiology: Results for orders placed or performed in visit on 04/04/20  Novel Coronavirus, NAA ([facility])     Status: None   Collection Time: 04/04/20  8:29 AM   Specimen: Nasopharyngeal(NP) swabs in vial transport medium   Nasopharynge  Screenin  Result Value Ref Range Status   SARS-CoV-2, NAA Not Detected Not Detected Final    Comment: This nucleic acid amplification test was developed and its performance characteristics determined by [employer]. Nucleic  acid amplification tests include RT-PCR and TMA. This test has not been FDA cleared or approved. This test has been authorized by FDA under an Emergency Use Authorization (EUA). This test is only authorized for the duration of time the declaration that circumstances exist justifying the authorization of the emergency use of in vitro diagnostic tests for detection of SARS-CoV-2 virus and/or diagnosis of COVID-19 infection under section 564(b)(1) of the Act, 21 U.S.C. [id number]) (1), unless the authorization is terminated or revoked sooner. When diagnostic testing is negative, the possibility of a false negative result should be considered in the context of a patient's recent exposures and the presence of clinical signs and symptoms consistent with COVID-19. An individual without symptoms of COVID-19 and who is not shedding SARS-CoV-2 virus wo uld expect to have a negative (not detected) result in this assay.    Labs: CBC: Recent Labs  Lab 05/31/22 1625  WBC 8.4  HGB 14.9  HCT 42.1  MCV 83.0  PLT 99991111   Basic Metabolic Panel: Recent Labs  Lab 05/31/22 1605 06/01/22 0025  NA 140 140  K 4.1 3.5  CL 108 110  CO2 23 22  GLUCOSE 119* 115*  BUN 17 15  CREATININE 1.24 1.33*  CALCIUM 8.3* 8.1*   Liver Function Tests: Recent Labs  Lab 06/01/22 0025  AST 22  ALT 18  ALKPHOS 41  BILITOT 0.8  PROT 6.0*  ALBUMIN 3.3*   CBG: No results for input(s): "GLUCAP" in the last 168 hours.  Discharge time spent: greater than 30 minutes.  Signed: Berle Mull, MD [facility] 06/01/2022

## 2022-06-06 ENCOUNTER — Encounter: Payer: Self-pay | Admitting: Family Medicine

## 2022-06-06 ENCOUNTER — Ambulatory Visit (INDEPENDENT_AMBULATORY_CARE_PROVIDER_SITE_OTHER): Payer: 59 | Admitting: Family Medicine

## 2022-06-06 VITALS — BP 110/60 | HR 44 | Temp 97.6°F | Ht 75.0 in | Wt 230.0 lb

## 2022-06-06 DIAGNOSIS — I639 Cerebral infarction, unspecified: Secondary | ICD-10-CM | POA: Diagnosis not present

## 2022-06-06 DIAGNOSIS — I480 Paroxysmal atrial fibrillation: Secondary | ICD-10-CM

## 2022-06-06 MED ORDER — PANTOPRAZOLE SODIUM 40 MG PO TBEC: 40.0000 mg | DELAYED_RELEASE_TABLET | Freq: Every day | ORAL | 3 refills | Status: DC

## 2022-06-06 NOTE — Progress Notes (Signed)
Subjective:     Patient ID: Joshua Li, male    DOB: Dec 05, 1960, 63 y.o.   MRN: [id number]  Chief Complaint  Patient presents with   [facility] follow-up     HPI  Ms [facility] f/u-on 3/4-felt a fib for about 1 hr.  Next day, L arm numb so called card and told to go to ER.  Told "mini stroke".  Was on ASA 81.   So, on eliquis now.   No residual symptoms. Has appt 4/7 w/neuro.  Has made life-style changes since our last OV.  Only feels afib every few months.  No dizziness.  right posterior central gyrus punctate infarct   echo ok.  CTA ok .  Cost of eliquis >300$(pt pd it).  Now has copay card so waiting for that.    Health Maintenance Due  Topic Date Due   COLONOSCOPY (Pts 45-42yr Insurance coverage will need to be confirmed)  04/14/2022    Past Medical History:  Diagnosis Date   A-fib (HCC)    Allergy    Asthma    GERD (gastroesophageal reflux disease)    Heart murmur    Hiatal hernia    Hyperlipidemia    Ulcerative colitis 2001   Status post total colectomy with J-pouch in 2003 -     Past Surgical History:  Procedure Laterality Date   CHOLECYSTECTOMY     pt not sure   COLECTOMY  2003   with one step take-down to J-pouch   COLECTOMY     COLECTOMY  2003   total colectomy with J pouch (one step procedure)   COLECTOMY  2003   total colectomy with j pouch ( one step)   TONSILLECTOMY     TOTAL COLECTOMY  2003   total colectomy with J pouch     Outpatient Medications Prior to Visit  Medication Sig Dispense Refill   albuterol (VENTOLIN HFA) 108 (90 Base) MCG/ACT inhaler INHALE 2 PUFFS INTO THE LUNGS EVERY 6 (SIX) HOURS AS NEEDED FOR WHEEZING OR SHORTNESS OF BREATH. 24 g 3   apixaban (ELIQUIS) 5 MG TABS tablet Take 1 tablet (5 mg total) by mouth 2 (two) times daily. 60 tablet 0   cetirizine (ZYRTEC) 10 MG tablet Take 10 mg by mouth daily.     Cholecalciferol (VITAMIN D) 50 MCG (2000 UT) CAPS Take 2,000 Units by mouth daily.     flecainide (TAMBOCOR) 50 MG tablet  Take 1 tablet (50 mg total) by mouth 2 (two) times daily. 180 tablet 3   metoprolol succinate (TOPROL-XL) 25 MG 24 hr tablet Take 1 tablet (25 mg total) by mouth daily. 30 tablet 0   rosuvastatin (CRESTOR) 20 MG tablet Take 1 tablet (20 mg total) by mouth daily. Annual appt due in January pt must see provider for future refills 90 tablet 3   pantoprazole (PROTONIX) 40 MG tablet Take 1 tablet (40 mg total) by mouth daily. 90 tablet 3   No facility-administered medications prior to visit.    No Known Allergies ROS neg/noncontributory except as noted HPI/below      Objective:     BP 110/60   Pulse (!) 44   Temp 97.6 F (36.4 C) (Temporal)   Ht '6\' 3"'$  (1.905 m)   Wt 230 lb (104.3 kg)   SpO2 96%   BMI 28.75 kg/m  Wt Readings from Last 3 Encounters:  06/06/22 230 lb (104.3 kg)  06/01/22 224 lb 10.4 oz (101.9 kg)  04/11/22 235  lb (106.6 kg)    Physical Exam   Gen: WDWN NAD HEENT: NCAT, conjunctiva not injected, sclera nonicteric NECK:  supple, no thyromegaly, no nodes, no carotid bruits CARDIAC:brady RRR, S1S2+, no murmur.  LUNGS: CTAB. No wheezes EXT:  no edema MSK: no gross abnormalities.  NEURO: A&O x3.  CN II-XII intact.  MS 5/5 all 4 PSYCH: normal mood. Good eye contact  Reviewed ER records     Assessment & Plan:   Problem List Items Addressed This Visit       Cardiovascular and Mediastinum   Paroxysmal atrial fibrillation (HCC) - Primary (Chronic)   Acute CVA (cerebrovascular accident) ([place])   PAF-mostly controlled.  Cont meds.  On eliquis now d/t CVA after flare a fib. CVA-new.  No residual.  On eliquis '5mg'$  bid now.  Monitor for bleeding.  If hit head, ER.  He will call for refill or get from Card.  Meds ordered this encounter  Medications   pantoprazole (PROTONIX) 40 MG tablet    Sig: Take 1 tablet (40 mg total) by mouth daily.    Dispense:  90 tablet    Refill:  3    Wellington Hampshire, MD

## 2022-06-06 NOTE — Patient Instructions (Signed)
It was very nice to see you today!  Monitor for bleeding   PLEASE NOTE:  If you had any lab tests please let us know if you have not heard back within a few days. You may see your results on MyChart before we have a chance to review them but we will give you a call once they are reviewed by [place]. If we ordered any referrals today, please let us know if you have not heard from their office within the next week.   Please try these tips to maintain a healthy lifestyle:  Eat most of your calories during the day when you are active. Eliminate processed foods including packaged sweets (pies, cakes, cookies), reduce intake of potatoes, white bread, white pasta, and white rice. Look for whole grain options, oat flour or almond flour.  Each meal should contain half fruits/vegetables, one quarter protein, and one quarter carbs (no bigger than a computer mouse).  Cut down on sweet beverages. This includes juice, soda, and sweet tea. Also watch fruit intake, though this is a healthier sweet option, it still contains natural sugar! Limit to 3 servings daily.  Drink at least 1 glass of water with each meal and aim for at least 8 glasses per day  Exercise at least 150 minutes every week.

## 2022-06-10 ENCOUNTER — Encounter: Payer: Self-pay | Admitting: Cardiology

## 2022-06-10 ENCOUNTER — Ambulatory Visit: Payer: 59 | Attending: Cardiology | Admitting: Cardiology

## 2022-06-10 VITALS — BP 110/66 | HR 40 | Ht 75.0 in | Wt 233.0 lb

## 2022-06-10 DIAGNOSIS — D6869 Other thrombophilia: Secondary | ICD-10-CM

## 2022-06-10 DIAGNOSIS — I480 Paroxysmal atrial fibrillation: Secondary | ICD-10-CM

## 2022-06-10 MED ORDER — METOPROLOL SUCCINATE ER 25 MG PO TB24: 25.0000 mg | ORAL_TABLET | Freq: Every day | ORAL | 3 refills | Status: DC

## 2022-06-10 MED ORDER — APIXABAN 5 MG PO TABS: 5.0000 mg | ORAL_TABLET | Freq: Two times a day (BID) | ORAL | 3 refills | Status: DC

## 2022-06-10 NOTE — Progress Notes (Signed)
Electrophysiology Office Note   Date:  06/10/2022   ID:  Joshua Li, DOB [birth year], MRN [id number]  PCP:  Tawnya Crook, MD  Cardiologist:  Ellyn Hack Primary Electrophysiologist:  Keoshia Steinmetz Meredith Leeds, MD    No chief complaint on file.    History of Present Illness: Joshua Li is a 62 y.o. male who is being seen today for the evaluation of palpitations at the request of Glenetta Hew. Presenting today for electrophysiology evaluation.    He has a history significant for hyperlipidemia.  He was seen in October 2018 was noted to have intermittent palpitations.  This been ongoing for many years.  A 30-day monitor showed multiple arrhythmias including PAT, PVCs, atrial flutter, atrial fibrillation, nonsustained VT.  Echo showed normal ejection fraction.  He is now on flecainide and metoprolol.  05/31/2022 he presented to the hospital with an episode of left arm numbness.  He had an episode of atrial fibrillation prior to that.  He was found to have an acute right punctate posterior gyrus infarct.  Fortunately his numbness has been improving.  Today, denies symptoms of palpitations, chest pain, shortness of breath, orthopnea, PND, lower extremity edema, claudication, dizziness, presyncope, syncope, bleeding, or neurologic sequela. The patient is tolerating medications without difficulties.     Past Medical History:  Diagnosis Date   A-fib (HCC)    Allergy    Asthma    GERD (gastroesophageal reflux disease)    Heart murmur    Hiatal hernia    Hyperlipidemia    Ulcerative colitis 2001   Status post total colectomy with J-pouch in 2003 -    Past Surgical History:  Procedure Laterality Date   CHOLECYSTECTOMY     pt not sure   COLECTOMY  2003   with one step take-down to J-pouch   COLECTOMY     COLECTOMY  2003   total colectomy with J pouch (one step procedure)   COLECTOMY  2003   total colectomy with j pouch ( one step)   TONSILLECTOMY     TOTAL COLECTOMY  2003   total  colectomy with J pouch      Current Outpatient Medications  Medication Sig Dispense Refill   albuterol (VENTOLIN HFA) 108 (90 Base) MCG/ACT inhaler INHALE 2 PUFFS INTO THE LUNGS EVERY 6 (SIX) HOURS AS NEEDED FOR WHEEZING OR SHORTNESS OF BREATH. 24 g 3   apixaban (ELIQUIS) 5 MG TABS tablet Take 1 tablet (5 mg total) by mouth 2 (two) times daily. 60 tablet 0   cetirizine (ZYRTEC) 10 MG tablet Take 10 mg by mouth daily.     Cholecalciferol (VITAMIN D) 50 MCG (2000 UT) CAPS Take 2,000 Units by mouth daily.     flecainide (TAMBOCOR) 50 MG tablet Take 1 tablet (50 mg total) by mouth 2 (two) times daily. 180 tablet 3   metoprolol succinate (TOPROL-XL) 25 MG 24 hr tablet Take 1 tablet (25 mg total) by mouth daily. 30 tablet 0   pantoprazole (PROTONIX) 40 MG tablet Take 1 tablet (40 mg total) by mouth daily. 90 tablet 3   rosuvastatin (CRESTOR) 20 MG tablet Take 1 tablet (20 mg total) by mouth daily. Annual appt due in January pt must see provider for future refills 90 tablet 3   No current facility-administered medications for this visit.    Allergies:   Patient has no known allergies.   Social History:  The patient  reports that he has never smoked. He has never used smokeless tobacco. He  reports current alcohol use of about 2.0 standard drinks of alcohol per week. He reports that he does not use drugs.   Family History:  The patient's family history includes Cancer in his mother; Heart attack (age of onset: 25) in his father; Lymphoma (age of onset: 30) in his brother; Stomach cancer (age of onset: 77) in his father.   ROS:  Please see the history of present illness.   Otherwise, review of systems is positive for none.   All other systems are reviewed and negative.   PHYSICAL EXAM: VS:  BP 110/66   Pulse (!) 40   Ht 6\' 3"  (1.905 m)   Wt 233 lb (105.7 kg)   SpO2 98%   BMI 29.12 kg/m  , BMI Body mass index is 29.12 kg/m. GEN: Well nourished, well developed, in no acute distress  HEENT:  normal  Neck: no JVD, carotid bruits, or masses Cardiac: RRR; no murmurs, rubs, or gallops,no edema  Respiratory:  clear to auscultation bilaterally, normal work of breathing GI: soft, nontender, nondistended, + BS MS: no deformity or atrophy  Skin: warm and dry Neuro:  Strength and sensation are intact Psych: euthymic mood, full affect  EKG:  EKG is not ordered today. Personal review of the ekg ordered 06/02/22 shows sinus rhythm   Recent Labs: 04/11/2022: TSH 2.07 05/31/2022: Hemoglobin 14.9; Platelets 238 06/01/2022: ALT 18; BUN 15; Creatinine, Ser 1.33; Potassium 3.5; Sodium 140    Lipid Panel     Component Value Date/Time   CHOL 133 06/01/2022 0025   TRIG 84 06/01/2022 0025   TRIG 74 04/10/2006 0808   HDL 47 06/01/2022 0025   CHOLHDL 2.8 06/01/2022 0025   VLDL 17 06/01/2022 0025   LDLCALC 69 06/01/2022 0025   LDLDIRECT 123.3 03/02/2009 0832     Wt Readings from Last 3 Encounters:  06/10/22 233 lb (105.7 kg)  06/06/22 230 lb (104.3 kg)  06/01/22 224 lb 10.4 oz (101.9 kg)      Other studies Reviewed: Additional studies/ records that were reviewed today include: TTE 03/16/17  Review of the above records today demonstrates:  - Left ventricle: The cavity size was normal. Systolic function was   normal. The estimated ejection fraction was in the range of 60%   to 65%. Wall motion was normal; there were no regional wall   motion abnormalities. - Atrial septum: No defect or patent foramen ovale was identified. - Left atrium:  The atrium was normal in size.  Myoview 05/24/17 There was no ST segment deviation noted during stress. AFIB. This is a low risk study. No perfusion defects. No ischemia.  ETT 06/15/17 - personally reviewed Blood pressure demonstrated a normal response to exercise. Upsloping ST segment depression ST segment depression of 1 mm was noted during stress in the II, III, aVF, V5 and V6 leads.   Positive ETT 1 mm upsloping ST segment depression in  inferior lateral leads with stress Normal hemodynamic response  ASSESSMENT AND PLAN:  1.  Nonsustained ventricular tachycardia: Had 5-20 beats on cardiac monitoring.  Ejection fraction is normal with low risk Myoview.  No further episodes on flecainide.  No changes.  2.  SVT: No further episodes since starting flecainide  3.  Paroxysmal atrial fibrillation: CHA2DS2-VASc of 2.  Currently on flecainide and Toprol-XL.  Recent stroke and is now on Eliquis.  Recovering from his stroke with only very mild left upper arm numbness.  4.  Secondary hypercoagulable state: Currently on Eliquis for atrial fibrillation  5.  High risk medication monitoring: Currently on flecainide.  QRS remains narrow.  Current medicines are reviewed at length with the patient today.   The patient does not have concerns regarding his medicines.  The following changes were made today: none  Labs/ tests ordered today include:  No orders of the defined types were placed in this encounter.   Disposition:   FU 6 months  Signed, Philomena Buttermore Meredith Leeds, MD  06/10/2022 4:26 PM     [place] [address] [phone] (office) [phone] (fax)

## 2022-06-10 NOTE — Addendum Note (Signed)
Addended by: Stanton Kidney on: 06/10/2022 05:31 PM   Modules accepted: Orders

## 2022-06-17 ENCOUNTER — Other Ambulatory Visit: Payer: Self-pay | Admitting: Internal Medicine

## 2022-06-28 NOTE — Progress Notes (Deleted)
[facility] [address] [place]. [place] [address] [phone]       HOSPITAL FOLLOW UP NOTE  [name] Date of Birth:  [birth year] Medical Record Number:  [id number]   Reason for Referral:  hospital stroke follow up    SUBJECTIVE:   CHIEF COMPLAINT:  No chief complaint on file.   HPI:   [name] is a 62 y.o. male with history of A-fib not on AC, HLD, and snoring who presented on 05/31/2022 with left arm numbness.  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  CTA head/neck unremarkable.  EF 55 to 60%.  LDL 69.  A1c 5.4.  Aspirin PTA and recommend initiating Eliquis in setting of current stroke, was not previously on [facility] due to low CHA2DS2-VASc score.  Recommended continuation of Crestor.  Concern of underlying sleep apnea and recommended outpatient testing.  Symptoms resolved and discharged home without therapy needs.        PERTINENT IMAGING  Per hospitalization 05/31/2022 - *** CT no acute abnormality CTA head and neck unremarkable MRI right posterior central gyrus punctate infarct 2D Echo EF 55 to 60% LDL 69 HgbA1c 5.4    ROS:   14 system review of systems performed and negative with exception of ***  PMH:  Past Medical History:  Diagnosis Date   A-fib (HCC)    Allergy    Asthma    GERD (gastroesophageal reflux disease)    Heart murmur    Hiatal hernia    Hyperlipidemia    Ulcerative colitis 2001   Status post total colectomy with J-pouch in 2003 -     [place]:  Past Surgical History:  Procedure Laterality Date   CHOLECYSTECTOMY     pt not sure   COLECTOMY  2003   with one step take-down to J-pouch   COLECTOMY     COLECTOMY  2003   total colectomy with J pouch (one step procedure)   COLECTOMY  2003   total colectomy with j pouch ( one step)   TONSILLECTOMY     TOTAL COLECTOMY  2003   total colectomy with J pouch     Social History:  Social History    Socioeconomic History   Marital status: Soil scientist    Spouse name: Not on file   Number of children: 1   Years of education: 16   Highest education level: Bachelor's degree (e.g., BA, AB, BS)  Occupational History   Occupation: Retail    Comment: [employer]  Tobacco Use   Smoking status: Never   Smokeless tobacco: Never  Vaping Use   Vaping Use: Never used  Substance and Sexual Activity   Alcohol use: Yes    Alcohol/week: 2.0 standard drinks of alcohol    Types: 2 Cans of beer per week   Drug use: No   Sexual activity: Yes  Other Topics Concern   Not on file  Social History Narrative   Fun: Geocaching   Denies religious beliefs effecting health care.    Marketing/cust service   Social Determinants of Health   Financial Resource Strain: Not on file  Food Insecurity: No Food Insecurity (06/01/2022)   Hunger Vital Sign    Worried About Running Out of Food in the Last Year: Never true    Ran Out of Food in the Last Year: Never true  Transportation Needs: No Transportation Needs (06/01/2022)   PRAPARE - Transportation  Lack of Transportation (Medical): No    Lack of Transportation (Non-Medical): No  Physical Activity: Insufficiently Active (02/10/2017)   Exercise Vital Sign    Days of Exercise per Week: 1 day    Minutes of Exercise per Session: 30 min  Stress: Not on file  Social Connections: Not on file  Intimate Partner Violence: Not At Risk (06/01/2022)   Humiliation, Afraid, Rape, and Kick questionnaire    Fear of Current or Ex-Partner: No    Emotionally Abused: No    Physically Abused: No    Sexually Abused: No    Family History:  Family History  Problem Relation Age of Onset   Cancer Mother        breast and ovarian   Heart attack Father 3   Stomach cancer Father 26       stomach & bladder -cause of death   Lymphoma Brother 58       Currently 70   Colon cancer Neg Hx    Esophageal cancer Neg Hx    Pancreatic cancer Neg Hx    Rectal cancer Neg Hx      Medications:   Current Outpatient Medications on File Prior to Visit  Medication Sig Dispense Refill   albuterol (VENTOLIN HFA) 108 (90 Base) MCG/ACT inhaler INHALE 2 PUFFS INTO THE LUNGS EVERY 6 (SIX) HOURS AS NEEDED FOR WHEEZING OR SHORTNESS OF BREATH. 24 g 3   apixaban (ELIQUIS) 5 MG TABS tablet Take 1 tablet (5 mg total) by mouth 2 (two) times daily. 180 tablet 3   cetirizine (ZYRTEC) 10 MG tablet Take 10 mg by mouth daily.     Cholecalciferol (VITAMIN D) 50 MCG (2000 UT) CAPS Take 2,000 Units by mouth daily.     flecainide (TAMBOCOR) 50 MG tablet Take 1 tablet (50 mg total) by mouth 2 (two) times daily. 180 tablet 3   metoprolol succinate (TOPROL-XL) 25 MG 24 hr tablet Take 1 tablet (25 mg total) by mouth daily. 90 tablet 3   pantoprazole (PROTONIX) 40 MG tablet Take 1 tablet (40 mg total) by mouth daily. 90 tablet 3   rosuvastatin (CRESTOR) 20 MG tablet TAKE 1 TABLET DAILY 90 tablet 3   No current facility-administered medications on file prior to visit.    Allergies:  No Known Allergies    OBJECTIVE:  Physical Exam  There were no vitals filed for this visit. There is no height or weight on file to calculate BMI. No results found.     07/08/2021    8:31 AM  Depression screen PHQ 2/9  Decreased Interest 0  Down, Depressed, Hopeless 0  PHQ - 2 Score 0     General: well developed, well nourished, seated, in no evident distress Head: head normocephalic and atraumatic.   Neck: supple with no carotid or supraclavicular bruits Cardiovascular: regular rate and rhythm, no murmurs Musculoskeletal: no deformity Skin:  no rash/petichiae Vascular:  Normal pulses all extremities   Neurologic Exam Mental Status: Awake and fully alert. Oriented to place and time. Recent and remote memory intact. Attention span, concentration and fund of knowledge appropriate. Mood and affect appropriate.  Cranial Nerves: Fundoscopic exam reveals sharp disc margins. Pupils equal, briskly  reactive to light. Extraocular movements full without nystagmus. Visual fields full to confrontation. Hearing intact. Facial sensation intact. Face, tongue, palate moves normally and symmetrically.  Motor: Normal bulk and tone. Normal strength in all tested extremity muscles Sensory.: intact to touch , pinprick , position and vibratory sensation.  Coordination: Rapid  alternating movements normal in all extremities. Finger-to-nose and heel-to-shin performed accurately bilaterally. Gait and Station: Arises from chair without difficulty. Stance is normal. Gait demonstrates normal stride length and balance with ***. Tandem walk and heel toe ***.  Reflexes: 1+ and symmetric. Toes downgoing.     NIHSS  *** Modified Rankin  ***      ASSESSMENT: Joshua Li is a 62 y.o. year old male with right punctate posterior central gyrus infarct on 05/31/2022 secondary to PAF not on [facility]. Vascular risk factors include PAF (previously not on AC due to low CHA2DS2-VASc score), HTN, HLD and possible sleep apnea.      PLAN:  Ischemic stroke:  Residual deficit: ***.  Continue Eliquis 5mg  twice daily and rosuvastatin (Crestor) for secondary stroke prevention.  Refills will be obtained by PCP/cardiology Discussed secondary stroke prevention measures and importance of close PCP follow up for aggressive stroke risk factor management including BP goal<130/90, HLD with LDL goal<70 and DM with A1c.<7 .  Stroke labs 05/2022: LDL 69, A1c 5.4 I have gone over the pathophysiology of stroke, warning signs and symptoms, risk factors and their management in some detail with instructions to go to the closest emergency room for symptoms of concern. At risk for sleep apnea: ***    Follow up in *** or call earlier if needed   CC:  [facility] provider: Dr. Leonie Man PCP: Tawnya Crook, MD    I spent *** minutes of face-to-face and non-face-to-face time with patient.  This included previsit chart review including review of recent  hospitalization, lab review, study review, order entry, electronic health record documentation, patient education regarding recent stroke including etiology, secondary stroke prevention measures and importance of managing stroke risk factors, residual deficits and typical recovery time and answered all other questions to patient satisfaction   Frann Rider, AGNP-BC  [facility] [address] [place], [place] [address]  Phone [phone] Fax [phone] Note: This document was prepared with digital dictation and possible smart phrase technology. Any transcriptional errors that result from this process are unintentional.

## 2022-06-29 ENCOUNTER — Ambulatory Visit (INDEPENDENT_AMBULATORY_CARE_PROVIDER_SITE_OTHER): Payer: 59 | Admitting: Neurology

## 2022-06-29 ENCOUNTER — Inpatient Hospital Stay: Payer: 59 | Admitting: Adult Health

## 2022-06-29 ENCOUNTER — Encounter: Payer: Self-pay | Admitting: Neurology

## 2022-06-29 VITALS — BP 121/73 | HR 50 | Ht 75.0 in | Wt 230.0 lb

## 2022-06-29 DIAGNOSIS — I63411 Cerebral infarction due to embolism of right middle cerebral artery: Secondary | ICD-10-CM

## 2022-06-29 NOTE — Progress Notes (Signed)
[facility]  PATIENT: [name] DOB: 1961-03-02  REQUESTING CLINICIAN: Rosalin Hawking, MD HISTORY FROM: Patient and chart review  REASON FOR VISIT: Stroke, follow up    HISTORICAL  CHIEF COMPLAINT:  Chief Complaint  Patient presents with   New Patient (Initial Visit)    Rm 15. Patient alone, arm numbness cleared.     HISTORY OF PRESENT ILLNESS:  This is a 62 year old gentleman with the past medical history of atrial fibrillation on Eliquis but previously not on anticoagulation, hyperlipidemia, who is presenting after being admitted in the hospital for left arm numbness.  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  He was started on Eliquis to 5 mg twice daily.  By the time he left the hospital his symptoms were almost back to normal.  Since leaving the hospital, he has been doing very well, again back to his normal self.  The numbness resolved.  He denies any weakness at that time and currently does not have any weakness.  At present he does not have any symptoms.  He is doing well.   Hospital Summary and course  HPI: ASHOT CARIGNAN is a 62 y.o. male past medical history of atrial fibrillation not on anticoagulation, hyperlipidemia, presenting to the emergency room for evaluation of left arm numbness that started suddenly this afternoon at work.  Reports that he was in his office and had a sudden onset of left arm numbness when he returned from lunch around 1 PM.  Symptoms persisted that made him come to the emergency room.  Had an episode of palpitations of heart rate up to 145 bpm that lasted for a while.  No prior history of stroke symptoms.  CHA2DS2-VASc score was 0 until now hence not on anticoagulation. No prior history of MIs.  No recent illnesses or sicknesses. On review of systems, other than documented in the HPI, has a history of excessive snoring.  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  Symptoms currently resolved.  Did not receive any tPA as patient was outside of the window.. CT of the head no acute abnormality CTA head and neck unremarkable for any large vessel occlusion MRI brain right posterior central gyrus punctate infarct 2D Echo EF 55 to 60% LDL 69 on statin. HgbA1c 5.4 Aspirin 81 mg daily prior to admission, now on Eliquis (apixaban) daily for stroke prevention  OTHER MEDICAL CONDITIONS: Atrial fibrillation on Eliquis, Hyperlipidemia    REVIEW OF SYSTEMS: Full 14 system review of systems performed and negative with exception of: As noted in the HPI   ALLERGIES: No Known Allergies  HOME MEDICATIONS: Outpatient Medications Prior to Visit  Medication Sig Dispense Refill   albuterol (VENTOLIN HFA) 108 (90 Base) MCG/ACT inhaler INHALE 2 PUFFS INTO THE LUNGS EVERY 6 (SIX) HOURS AS NEEDED FOR WHEEZING OR SHORTNESS OF BREATH. 24 g 3   apixaban (ELIQUIS) 5 MG TABS tablet Take 1 tablet (5 mg total) by mouth 2 (two) times daily. 180 tablet 3   cetirizine (ZYRTEC) 10 MG tablet Take 10 mg by mouth daily.     Cholecalciferol (VITAMIN D) 50 MCG (2000 UT) CAPS Take 2,000 Units by mouth daily.     flecainide (TAMBOCOR) 50 MG tablet Take 1 tablet (50 mg total) by mouth 2 (two) times daily. 180 tablet 3  metoprolol succinate (TOPROL-XL) 25 MG 24 hr tablet Take 1 tablet (25 mg total) by mouth daily. 90 tablet 3   pantoprazole (PROTONIX) 40 MG tablet Take 1 tablet (40 mg total) by mouth daily. 90 tablet 3   rosuvastatin (CRESTOR) 20 MG tablet TAKE 1 TABLET DAILY 90 tablet 3   No facility-administered medications prior to visit.    PAST MEDICAL HISTORY: Past Medical History:  Diagnosis Date   A-fib    Allergy    Asthma    GERD (gastroesophageal reflux disease)    Heart murmur    Hiatal hernia    Hyperlipidemia    Ulcerative colitis 2001    Status post total colectomy with J-pouch in 2003 -     PAST SURGICAL HISTORY: Past Surgical History:  Procedure Laterality Date   CHOLECYSTECTOMY     pt not sure   COLECTOMY  2003   with one step take-down to J-pouch   COLECTOMY     COLECTOMY  2003   total colectomy with J pouch (one step procedure)   COLECTOMY  2003   total colectomy with j pouch ( one step)   TONSILLECTOMY     TOTAL COLECTOMY  2003   total colectomy with J pouch     FAMILY HISTORY: Family History  Problem Relation Age of Onset   Cancer Mother        breast and ovarian   Heart attack Father 102   Stomach cancer Father 62       stomach & bladder -cause of death   Lymphoma Brother 41       Currently 70   Colon cancer Neg Hx    Esophageal cancer Neg Hx    Pancreatic cancer Neg Hx    Rectal cancer Neg Hx     SOCIAL HISTORY: Social History   Socioeconomic History   Marital status: Soil scientist    Spouse name: Not on file   Number of children: 1   Years of education: 16   Highest education level: Bachelor's degree (e.g., BA, AB, BS)  Occupational History   Occupation: Retail    Comment: [employer]  Tobacco Use   Smoking status: Never   Smokeless tobacco: Never  Vaping Use   Vaping Use: Never used  Substance and Sexual Activity   Alcohol use: Yes    Alcohol/week: 2.0 standard drinks of alcohol    Types: 2 Cans of beer per week   Drug use: No   Sexual activity: Yes  Other Topics Concern   Not on file  Social History Narrative   Fun: Geocaching   Denies religious beliefs effecting health care.    Marketing/cust service   Social Determinants of Health   Financial Resource Strain: Not on file  Food Insecurity: No Food Insecurity (06/01/2022)   Hunger Vital Sign    Worried About Running Out of Food in the Last Year: Never true    Ran Out of Food in the Last Year: Never true  Transportation Needs: No Transportation Needs (06/01/2022)   PRAPARE - Hydrologist  (Medical): No    Lack of Transportation (Non-Medical): No  Physical Activity: Insufficiently Active (02/10/2017)   Exercise Vital Sign    Days of Exercise per Week: 1 day    Minutes of Exercise per Session: 30 min  Stress: Not on file  Social Connections: Not on file  Intimate Partner Violence: Not At Risk (06/01/2022)   Humiliation, Afraid, Rape, and Kick questionnaire  Fear of Current or Ex-Partner: No    Emotionally Abused: No    Physically Abused: No    Sexually Abused: No     PHYSICAL EXAM  GENERAL EXAM/CONSTITUTIONAL: Vitals:  Vitals:   06/29/22 0836  BP: 121/73  Pulse: (!) 50  Weight: 230 lb (104.3 kg)  Height: 6\' 3"  (1.905 m)   Body mass index is 28.75 kg/m. Wt Readings from Last 3 Encounters:  06/29/22 230 lb (104.3 kg)  06/10/22 233 lb (105.7 kg)  06/06/22 230 lb (104.3 kg)   Patient is in no distress; well developed, nourished and groomed; neck is supple  EYES: Visual fields full to confrontation, Extraocular movements intacts,   MUSCULOSKELETAL: Gait, strength, tone, movements noted in Neurologic exam below  NEUROLOGIC: MENTAL STATUS:      No data to display         awake, alert, oriented to person, place and time recent and remote memory intact normal attention and concentration language fluent, comprehension intact, naming intact fund of knowledge appropriate  CRANIAL NERVE:  2nd, 3rd, 4th, 6th - Visual fields full to confrontation, extraocular muscles intact, no nystagmus 5th - facial sensation symmetric 7th - facial strength symmetric 8th - hearing intact 9th - palate elevates symmetrically, uvula midline 11th - shoulder shrug symmetric 12th - tongue protrusion midline  MOTOR:  normal bulk and tone, full strength in the BUE, BLE  SENSORY:  normal and symmetric to light touch  COORDINATION:  finger-nose-finger, fine finger movements normal  REFLEXES:  deep tendon reflexes present and symmetric  GAIT/STATION:   normal   DIAGNOSTIC DATA (LABS, IMAGING, TESTING) - I reviewed patient records, labs, notes, testing and imaging myself where available.  Lab Results  Component Value Date   WBC 8.4 05/31/2022   HGB 14.9 05/31/2022   HCT 42.1 05/31/2022   MCV 83.0 05/31/2022   PLT 238 05/31/2022      Component Value Date/Time   NA 140 06/01/2022 0025   K 3.5 06/01/2022 0025   CL 110 06/01/2022 0025   CO2 22 06/01/2022 0025   GLUCOSE 115 (H) 06/01/2022 0025   BUN 15 06/01/2022 0025   CREATININE 1.33 (H) 06/01/2022 0025   CALCIUM 8.1 (L) 06/01/2022 0025   PROT 6.0 (L) 06/01/2022 0025   ALBUMIN 3.3 (L) 06/01/2022 0025   AST 22 06/01/2022 0025   ALT 18 06/01/2022 0025   ALKPHOS 41 06/01/2022 0025   BILITOT 0.8 06/01/2022 0025   GFRNONAA >60 06/01/2022 0025   GFRAA 92 02/06/2008 0907   Lab Results  Component Value Date   CHOL 133 06/01/2022   HDL 47 06/01/2022   LDLCALC 69 06/01/2022   LDLDIRECT 123.3 03/02/2009   TRIG 84 06/01/2022   CHOLHDL 2.8 06/01/2022   Lab Results  Component Value Date   HGBA1C 5.9 04/11/2022   Lab Results  Component Value Date   VITAMINB12 299 04/11/2022   Lab Results  Component Value Date   TSH 2.07 04/11/2022    MRI Brain 05/31/2022 Punctate acute to subacute cortical infarct in the right postcentral gyrus.  CTA Head and Neck 05/31/2022 1. No intracranial large vessel occlusion or significant stenosis. 2. No hemodynamically significant stenosis in the neck  Echo 06/01/2022 1. Left ventricular ejection fraction, by estimation, is 55 to 60%. The left ventricle has normal function. The left ventricle has no regional wall motion abnormalities. Left ventricular diastolic parameters are consistent with Grade I diastolic dysfunction (impaired relaxation).  2. Right ventricular systolic function is normal. The right  ventricular size is normal. Tricuspid regurgitation signal is inadequate for assessing PA pressure.  3. The mitral valve is normal in structure.  No evidence of mitral valve regurgitation. No evidence of mitral stenosis.  4. The aortic valve is tricuspid. Aortic valve regurgitation is trivial. No aortic stenosis is present.  5. Aortic dilatation noted. There is mild dilatation of the aortic root, measuring 39 mm.  6. The inferior vena cava is dilated in size with >50% respiratory variability, suggesting right atrial pressure of 8 mmHg   ASSESSMENT AND PLAN  62 y.o. year old male with history of atrial fibrillation, hyperlipidemia who presented with left arm numbness found to have a right central gyrus stroke.  Stroke etiology likely cardioembolic.  Patient initially was not anticoagulated, was only on aspirin but since the finding of the stroke he is now on Eliquis 5 mg twice daily.  He is tolerating the medication very well.  Denies any side effect.  His numbness is resolved.  He does not have any deficits.  Plan for patient to continue current medications including Crestor and Eliquis, continue follow-up with PCP, increase physical activity and I will see him in 1 year for follow-up or sooner if worse.  He voices understanding.   1. Cerebrovascular accident (CVA) due to embolism of right middle cerebral artery      Patient Instructions  Continue current medications including Eliquis 5 mg twice daily Continue with physical activity Continue to follow with PCP and return in 1 year or sooner if worse.  No orders of the defined types were placed in this encounter.   No orders of the defined types were placed in this encounter.   Return in about 1 year (around 06/29/2023).    Alric Ran, MD 06/29/2022, 9:15 AM  [facility] [address], [place], [place] [address] [phone]

## 2022-06-29 NOTE — Patient Instructions (Signed)
Continue current medications including Eliquis 5 mg twice daily Continue with physical activity Continue to follow with PCP and return in 1 year or sooner if worse.

## 2022-08-19 ENCOUNTER — Other Ambulatory Visit: Payer: Self-pay | Admitting: Cardiology

## 2022-08-27 ENCOUNTER — Other Ambulatory Visit: Payer: Self-pay | Admitting: Family Medicine

## 2022-08-29 ENCOUNTER — Telehealth: Payer: Self-pay

## 2022-08-29 ENCOUNTER — Other Ambulatory Visit (HOSPITAL_COMMUNITY): Payer: Self-pay

## 2022-08-29 NOTE — Telephone Encounter (Signed)
PA request sent to PA team

## 2022-08-29 NOTE — Telephone Encounter (Signed)
Please complete PA for PROTONIX 40 MG. Thanks!  This was attached to recent refill request from pharmacy   Pharmacy comment: Your patients benefit plan only allows for a quantity of 90 tablets in 365 Days without PA. They have 0 (zero) remaining for this time period. You may obtain  Prior Authorization by calling [phone], Thank you!

## 2022-08-29 NOTE — Telephone Encounter (Signed)
Pharmacy Patient Advocate Encounter   Received notification that prior authorization for Pantoprazole 40mg  is required/requested.   PA submitted to [facility] via CoverMyMeds Key # [id number]   Prior Authorization has been approved.    PA#  [id number] Effective dates: 08/29/22 through 08/29/23  Per [facility] test claim, copay for 30 days supply is $0

## 2022-10-19 ENCOUNTER — Ambulatory Visit: Payer: 59 | Admitting: Nurse Practitioner

## 2023-01-12 ENCOUNTER — Ambulatory Visit: Payer: 59 | Admitting: Cardiology

## 2023-01-12 NOTE — Progress Notes (Signed)
Subjective:     Patient ID: Joshua Li, male    DOB: [birth year], 62 y.o.   MRN: [id number]  Chief Complaint  Patient presents with   Rash    Pt has a few skin lesions on face, he will like a derm referral , pt would also like to schedule colonoscopy w/o any GI symptoms     HPI   Skin lesions/tags - He complains of skin lesions on face. Lesion on his left forehead noticed in the last month or so. Hasn't seen a dermatologist in a while. Precancerous growth on bridge of his nose about 25 years ago. Reports multiple skin tags on his neck. No previous hx of skin cancer. Also, some spots on legs.  A lot of sun exposure in past  Colonoscopy - Pt due for colonoscopy. Hx of ulcerative colitis. Had total colectomy with j pouch in 2003. No current rectal bleeding or GI sx. He is established with Dr. Claudette Head of GI. He has tried calling to schedule a colonoscopy and was told he needed a referral from PCP.   Hx of TIA 05/2022 - Taking eliquis 5 mg BID. States he hasn't had labs done since his discharge from the hospital. Reports he was meant to follow up with cardiology, but it was rescheduled to November. No active bleeding   Health Maintenance Due  Topic Date Due   Colonoscopy  04/14/2022    Past Medical History:  Diagnosis Date   A-fib (HCC)    Actinic keratoses    Allergy    Asthma    GERD (gastroesophageal reflux disease)    Heart murmur    Hiatal hernia    Hyperlipidemia    Ulcerative colitis 2001   Status post total colectomy with J-pouch in 2003 -     Past Surgical History:  Procedure Laterality Date   CHOLECYSTECTOMY     pt not sure   COLECTOMY  2003   with one step take-down to J-pouch   COLECTOMY     COLECTOMY  2003   total colectomy with J pouch (one step procedure)   COLECTOMY  2003   total colectomy with j pouch ( one step)   TONSILLECTOMY     TOTAL COLECTOMY  2003   total colectomy with J pouch       Current Outpatient Medications:    albuterol  (VENTOLIN HFA) 108 (90 Base) MCG/ACT inhaler, INHALE 2 PUFFS INTO THE LUNGS EVERY 6 (SIX) HOURS AS NEEDED FOR WHEEZING OR SHORTNESS OF BREATH., Disp: 24 g, Rfl: 3   apixaban (ELIQUIS) 5 MG TABS tablet, Take 1 tablet (5 mg total) by mouth 2 (two) times daily., Disp: 180 tablet, Rfl: 3   cetirizine (ZYRTEC) 10 MG tablet, Take 10 mg by mouth daily., Disp: , Rfl:    Cholecalciferol (VITAMIN D) 50 MCG (2000 UT) CAPS, Take 2,000 Units by mouth daily., Disp: , Rfl:    flecainide (TAMBOCOR) 50 MG tablet, TAKE 1 TABLET BY MOUTH TWICE A DAY, Disp: 180 tablet, Rfl: 2   metoprolol succinate (TOPROL-XL) 25 MG 24 hr tablet, Take 1 tablet (25 mg total) by mouth daily., Disp: 90 tablet, Rfl: 3   pantoprazole (PROTONIX) 40 MG tablet, TAKE 1 TABLET DAILY., Disp: 90 tablet, Rfl: 3   rosuvastatin (CRESTOR) 20 MG tablet, TAKE 1 TABLET DAILY, Disp: 90 tablet, Rfl: 3  No Known Allergies ROS neg/noncontributory except as noted HPI/below      Objective:     BP 138/75  Pulse 68   Temp 97.8 F (36.6 C)   Ht 6\' 3"  (1.905 m)   Wt 229 lb 9.6 oz (104.1 kg)   SpO2 97%   BMI 28.70 kg/m  Wt Readings from Last 3 Encounters:  01/13/23 229 lb 9.6 oz (104.1 kg)  06/29/22 230 lb (104.3 kg)  06/10/22 233 lb (105.7 kg)    Physical Exam   Gen: WDWN NAD HEENT: NCAT, conjunctiva not injected, sclera nonicteric CARDIAC: RRR, S1S2+, no murmur. DP 2+B EXT:  no edema MSK: no gross abnormalities.  NEURO: A&O x3.  CN II-XII intact.  PSYCH: normal mood. Good eye contact  Probable SKs found near his left temple, below left knee on medial side and right lateral lower leg. Multiple skin tags on his neck.  The one on temple more light/white colored.  Rest are more brown.       Assessment & Plan:  Skin lesion -     Ambulatory referral to Dermatology  Paroxysmal atrial fibrillation (HCC)  Prediabetes -     Hemoglobin A1c  High risk medication use -     CBC with Differential/Platelet -     Comprehensive metabolic  panel -     Hemoglobin A1c -     Vitamin B12  Ulcerative pancolitis without complication (HCC) -     Ambulatory referral to Gastroenterology  1.  Skin lesions-suspect more SK, however the one on his left temple is different than the others.  Does have a history of precancerous lesions on his nose.  Will do referral to dermatology 2.  History of ulcerative colitis with colectomy.  Is due for a "colonoscopy" or other procedural check.  Was told he needed a referral to be seen by Dr. Russella Dar who has followed him in the past.  Referral placed. 3.  Prediabetes, A-fib, high risk medication use.  Has not had labs since starting on DOAC.  He is also on flecainide and other medications.  Need to check labs  Return for as sch in Jan.   I, Isabelle Course, acting as a scribe for Angelena Sole, MD., have documented all relevant documentation on the behalf of Angelena Sole, MD, as directed by  Angelena Sole, MD while in the presence of Angelena Sole, MD.  I, Angelena Sole, MD, have reviewed all documentation for this visit. The documentation on 01/13/23 for the exam, diagnosis, procedures, and orders are all accurate and complete.    Angelena Sole, MD

## 2023-01-13 ENCOUNTER — Ambulatory Visit (INDEPENDENT_AMBULATORY_CARE_PROVIDER_SITE_OTHER): Payer: 59 | Admitting: Family Medicine

## 2023-01-13 ENCOUNTER — Encounter: Payer: Self-pay | Admitting: Family Medicine

## 2023-01-13 ENCOUNTER — Telehealth: Payer: Self-pay | Admitting: Gastroenterology

## 2023-01-13 VITALS — BP 138/75 | HR 68 | Temp 97.8°F | Ht 75.0 in | Wt 229.6 lb

## 2023-01-13 DIAGNOSIS — L989 Disorder of the skin and subcutaneous tissue, unspecified: Secondary | ICD-10-CM

## 2023-01-13 DIAGNOSIS — K5100 Ulcerative (chronic) pancolitis without complications: Secondary | ICD-10-CM

## 2023-01-13 DIAGNOSIS — R7303 Prediabetes: Secondary | ICD-10-CM | POA: Diagnosis not present

## 2023-01-13 DIAGNOSIS — Z79899 Other long term (current) drug therapy: Secondary | ICD-10-CM | POA: Diagnosis not present

## 2023-01-13 DIAGNOSIS — I480 Paroxysmal atrial fibrillation: Secondary | ICD-10-CM | POA: Diagnosis not present

## 2023-01-13 LAB — CBC WITH DIFFERENTIAL/PLATELET
Basophils Absolute: 0 10*3/uL (ref 0.0–0.1)
Basophils Relative: 0.5 % (ref 0.0–3.0)
Eosinophils Absolute: 0.2 10*3/uL (ref 0.0–0.7)
Eosinophils Relative: 2.1 % (ref 0.0–5.0)
HCT: 45.4 % (ref 39.0–52.0)
Hemoglobin: 14.9 g/dL (ref 13.0–17.0)
Lymphocytes Relative: 42.6 % (ref 12.0–46.0)
Lymphs Abs: 3.3 10*3/uL (ref 0.7–4.0)
MCHC: 32.8 g/dL (ref 30.0–36.0)
MCV: 87.3 fL (ref 78.0–100.0)
Monocytes Absolute: 0.5 10*3/uL (ref 0.1–1.0)
Monocytes Relative: 6.5 % (ref 3.0–12.0)
Neutro Abs: 3.7 10*3/uL (ref 1.4–7.7)
Neutrophils Relative %: 48.3 % (ref 43.0–77.0)
Platelets: 210 10*3/uL (ref 150.0–400.0)
RBC: 5.21 Mil/uL (ref 4.22–5.81)
RDW: 13.8 % (ref 11.5–15.5)
WBC: 7.7 10*3/uL (ref 4.0–10.5)

## 2023-01-13 LAB — COMPREHENSIVE METABOLIC PANEL
ALT: 15 U/L (ref 0–53)
AST: 14 U/L (ref 0–37)
Albumin: 4 g/dL (ref 3.5–5.2)
Alkaline Phosphatase: 45 U/L (ref 39–117)
BUN: 15 mg/dL (ref 6–23)
CO2: 26 meq/L (ref 19–32)
Calcium: 9.1 mg/dL (ref 8.4–10.5)
Chloride: 105 meq/L (ref 96–112)
Creatinine, Ser: 1.24 mg/dL (ref 0.40–1.50)
GFR: 61.63 mL/min (ref >60.00)
Glucose, Bld: 92 mg/dL (ref 70–99)
Potassium: 3.2 meq/L — ABNORMAL LOW (ref 3.5–5.1)
Sodium: 140 meq/L (ref 135–145)
Total Bilirubin: 0.7 mg/dL (ref 0.2–1.2)
Total Protein: 6.8 g/dL (ref 6.0–8.3)

## 2023-01-13 LAB — VITAMIN B12: Vitamin B-12: 590 pg/mL (ref 211–911)

## 2023-01-13 LAB — HEMOGLOBIN A1C: Hgb A1c MFr Bld: 5.7 % (ref 4.6–6.5)

## 2023-01-13 NOTE — Progress Notes (Signed)
Labs are okay except potassium is a bit low.  I do not see that he is on a diuretic.  Please send in potassium 10 mEq daily #30 and repeat potassium in 1 week

## 2023-01-13 NOTE — Telephone Encounter (Signed)
The pt states he was told that he needs a colon by his PCP.  Per recall pt needs appt to discuss.  He has been set up to see Dr Myrtie Neither since Dr Russella Dar is retiring.  No further questions or concerns

## 2023-01-13 NOTE — Telephone Encounter (Signed)
Inbound call from patient requesting to speak with a nurse in regards to his recall.

## 2023-01-13 NOTE — Patient Instructions (Signed)
It was very nice to see you today!  Call GI   PLEASE NOTE:  If you had any lab tests please let us know if you have not heard back within a few days. You may see your results on MyChart before we have a chance to review them but we will give you a call once they are reviewed by [place]. If we ordered any referrals today, please let us know if you have not heard from their office within the next week.   Please try these tips to maintain a healthy lifestyle:  Eat most of your calories during the day when you are active. Eliminate processed foods including packaged sweets (pies, cakes, cookies), reduce intake of potatoes, white bread, white pasta, and white rice. Look for whole grain options, oat flour or almond flour.  Each meal should contain half fruits/vegetables, one quarter protein, and one quarter carbs (no bigger than a computer mouse).  Cut down on sweet beverages. This includes juice, soda, and sweet tea. Also watch fruit intake, though this is a healthier sweet option, it still contains natural sugar! Limit to 3 servings daily.  Drink at least 1 glass of water with each meal and aim for at least 8 glasses per day  Exercise at least 150 minutes every week.

## 2023-01-16 ENCOUNTER — Other Ambulatory Visit: Payer: Self-pay | Admitting: *Deleted

## 2023-01-16 DIAGNOSIS — E876 Hypokalemia: Secondary | ICD-10-CM

## 2023-01-16 MED ORDER — POTASSIUM CHLORIDE CRYS ER 10 MEQ PO TBCR: 10.0000 meq | EXTENDED_RELEASE_TABLET | Freq: Two times a day (BID) | ORAL | 0 refills | Status: DC

## 2023-01-23 ENCOUNTER — Other Ambulatory Visit: Payer: Self-pay | Admitting: Family Medicine

## 2023-01-23 DIAGNOSIS — E876 Hypokalemia: Secondary | ICD-10-CM

## 2023-01-27 ENCOUNTER — Ambulatory Visit (INDEPENDENT_AMBULATORY_CARE_PROVIDER_SITE_OTHER): Payer: 59 | Admitting: Gastroenterology

## 2023-01-27 ENCOUNTER — Encounter: Payer: Self-pay | Admitting: Gastroenterology

## 2023-01-27 VITALS — BP 110/60 | HR 57 | Ht 75.0 in | Wt 227.0 lb

## 2023-01-27 DIAGNOSIS — Z8719 Personal history of other diseases of the digestive system: Secondary | ICD-10-CM | POA: Diagnosis not present

## 2023-01-27 DIAGNOSIS — Z7901 Long term (current) use of anticoagulants: Secondary | ICD-10-CM

## 2023-01-27 NOTE — Progress Notes (Signed)
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 Russella Dar for many years.  He appears to have had regular visits here until about 2007-2009 based on the listed encounters, but any notes from that predate this EMR.  He was last seen by Dr. Russella Dar for a routine pouchoscopy January 19, at which time a few pouch erosions/ulcers were found, and a 7 mm polyp was removed.  Pathology noted below, the polyp was inflammatory and erosion/ulcer biopsy showed some nonspecific chronic ileitis consistent with IBD. ______________________  Today, he reports feeling well overall, He states that his bowel habits are constantly changing and reports occasionally experience's explosive diarrhea episodes. He also states that certain food tend to cause him gas and bloating and thus, he attempts to avoid them. Denies any blood in stool.  We extensively reviewed his previous GI history including previous colonoscopies. He states that he was referred to [facility] by Dr. Russella Dar and underwent a J-pouch surgery in 2003. He had experienced a couple pouchitis episodes since then and the several years afterward, but states that he's currently not on any medication as he has not experienced an episode in several years.  He was cared for by Dr. Cedric Fishman of [facility] for many years, but is also not needed to see her since probably before his 2019 exam with Dr. Russella Dar.  He states that he has a history of A-fib. He is following up with Cardiology soon. He is currently on Eliquis   Joshua Li denies any chronic upper digestive symptoms.  His bowel habit frequency and  form tend to vary, but this is always been the case since his surgery and there been no clear changes in recent months.  He denies lower GI bleeding.   ROS:  Review of Systems  Constitutional:  Negative for appetite change and fever.  HENT:  Negative for trouble swallowing.   Respiratory:  Negative for cough and shortness of breath.   Cardiovascular:  Negative for chest pain.  Gastrointestinal:  Positive for diarrhea. Negative for abdominal distention, abdominal pain, anal bleeding, blood in stool, constipation, nausea, rectal pain and vomiting.  Genitourinary:  Negative for dysuria.  Musculoskeletal:  Negative for back pain.  Skin:  Negative for rash.  Neurological:  Negative for weakness.  All other systems reviewed and are negative.    Past Medical History: Past Medical History:  Diagnosis Date   A-fib (HCC)    Actinic keratoses    Allergy    Asthma    GERD (gastroesophageal reflux disease)    Heart murmur    Hiatal hernia    Hyperlipidemia    Ulcerative colitis 2001   Status post total colectomy with J-pouch in 2003 -      Past Surgical History: Past Surgical History:  Procedure Laterality Date   CHOLECYSTECTOMY     pt not sure   COLECTOMY  2003   with one step take-down to J-pouch   COLECTOMY     COLECTOMY  2003   total colectomy with J pouch (one step procedure)   COLECTOMY  2003   total colectomy with j pouch ( one step)  TONSILLECTOMY     TOTAL COLECTOMY  2003   total colectomy with J pouch      Family History: Family History  Problem Relation Age of Onset   Cancer Mother        breast and ovarian   Heart attack Father 27   Stomach cancer Father 26       stomach & bladder -cause of death   Lymphoma Brother 64       Currently 22   Colon cancer Neg Hx    Esophageal cancer Neg Hx    Pancreatic cancer Neg Hx    Rectal cancer Neg Hx     Social History: Social History   Socioeconomic History   Marital status: Media planner    Spouse  name: Not on file   Number of children: 1   Years of education: 16   Highest education level: Bachelor's degree (e.g., BA, AB, BS)  Occupational History   Occupation: Retail    Comment: [employer]  Tobacco Use   Smoking status: Never   Smokeless tobacco: Never  Vaping Use   Vaping status: Never Used  Substance and Sexual Activity   Alcohol use: Yes    Alcohol/week: 2.0 standard drinks of alcohol    Types: 2 Cans of beer per week   Drug use: No   Sexual activity: Yes  Other Topics Concern   Not on file  Social History Narrative   Fun: Geocaching   Denies religious beliefs effecting health care.    Marketing/cust service   Social Determinants of Health   Financial Resource Strain: Not on file  Food Insecurity: No Food Insecurity (06/01/2022)   Hunger Vital Sign    Worried About Running Out of Food in the Last Year: Never true    Ran Out of Food in the Last Year: Never true  Transportation Needs: No Transportation Needs (06/01/2022)   PRAPARE - Administrator, Civil Service (Medical): No    Lack of Transportation (Non-Medical): No  Physical Activity: Insufficiently Active (02/10/2017)   Exercise Vital Sign    Days of Exercise per Week: 1 day    Minutes of Exercise per Session: 30 min  Stress: Not on file  Social Connections: Not on file    Allergies: No Known Allergies  Outpatient Meds: Current Outpatient Medications  Medication Sig Dispense Refill   albuterol (VENTOLIN HFA) 108 (90 Base) MCG/ACT inhaler INHALE 2 PUFFS INTO THE LUNGS EVERY 6 (SIX) HOURS AS NEEDED FOR WHEEZING OR SHORTNESS OF BREATH. 24 g 3   apixaban (ELIQUIS) 5 MG TABS tablet Take 1 tablet (5 mg total) by mouth 2 (two) times daily. 180 tablet 3   cetirizine (ZYRTEC) 10 MG tablet Take 10 mg by mouth daily.     Cholecalciferol (VITAMIN D) 50 MCG (2000 UT) CAPS Take 2,000 Units by mouth daily.     flecainide (TAMBOCOR) 50 MG tablet TAKE 1 TABLET BY MOUTH TWICE A DAY 180 tablet 2   KLOR-CON M10 10  MEQ tablet TAKE 1 TABLET BY MOUTH 2 TIMES DAILY. 180 tablet 1   metoprolol succinate (TOPROL-XL) 25 MG 24 hr tablet Take 1 tablet (25 mg total) by mouth daily. 90 tablet 3   pantoprazole (PROTONIX) 40 MG tablet TAKE 1 TABLET DAILY. 90 tablet 3   rosuvastatin (CRESTOR) 20 MG tablet TAKE 1 TABLET DAILY 90 tablet 3   No current facility-administered medications for this visit.      ___________________________________________________________________ Objective   Exam:  BP 110/60  Pulse (!) 57   Ht 6\' 3"  (1.905 m)   Wt 227 lb (103 kg)   BMI 28.37 kg/m  Wt Readings from Last 3 Encounters:  01/27/23 227 lb (103 kg)  01/13/23 229 lb 9.6 oz (104.1 kg)  06/29/22 230 lb (104.3 kg)    General: well-appearing   Eyes: sclera anicteric, no redness ENT: oral mucosa moist without lesions, no cervical or supraclavicular lymphadenopathy CV: RRR, no JVD, no peripheral edema Resp: clear to auscultation bilaterally, normal RR and effort noted GI: soft, no tenderness, with active bowel sounds. No guarding or palpable organomegaly noted. Skin; warm and dry, no rash or jaundice noted Neuro: awake, alert and oriented x 3. Normal gross motor function and fluent speech   Data:  1. Surgical [P], ileum, polyp - INFLAMMATORY PSEUDOPOLYP. - NEGATIVE FOR DYSPLASIA. 2. Surgical [P], ileum - MILDLY ACTIVE CHRONIC NONSPECIFIC ILEITIS, CONSISTENT WITH PATIENT'S CLINICAL HISTORY OF INFLAMMATORY BOWEL DISEASE. - NEGATIVE FOR GRANULOMAS OR DYSPLASIA.  Assessment: History of ulcerative colitis  Long term (current) use of anticoagulants  While he does not need colorectal cancer screening because he has had a total proctocolectomy with J-pouch, and precancerous polyps are very uncommon in the ileum except in certain genetic cancer syndrome such as FAP, I think he should have a pouchoscopy to assess the status of any IBD activity at this point.  Probably minimal if any given previous findings and current  clinical stability, but would still be helpful to establish that in case he has clinical changes at some point.  Plan:  -pouchoscopy (Miralax prep) He was agreeable after discussion of procedure and risks.  The benefits and risks of the planned procedure were described in detail with the patient or (when appropriate) their health care proxy.  Risks were outlined as including, but not limited to, bleeding, infection, perforation, adverse medication reaction leading to cardiac or pulmonary decompensation, pancreatitis (if ERCP).  The limitation of incomplete mucosal visualization was also discussed.  No guarantees or warranties were given.   Thank you for the courtesy of this consult.  Please call me with any questions or concerns.   I,Safa M Kadhim,acting as a scribe for Charlie Pitter III, MD.,have documented all relevant documentation on the behalf of Sherrilyn Rist, MD,as directed by  Sherrilyn Rist, MD while in the presence of Sherrilyn Rist, MD.   Marvis Repress III, MD, have reviewed all documentation for this visit. The documentation on 01/27/23 for the exam, diagnosis, procedures, and orders are all accurate and complete.    CC: Referring provider noted above

## 2023-01-27 NOTE — Patient Instructions (Addendum)
You have been scheduled for a pouchoscopy . Please follow written instructions given to you at your visit today.   Please pick up your prep supplies at the pharmacy within the next 1-3 days.  If you use inhalers (even only as needed), please bring them with you on the day of your procedure.  DO NOT TAKE 7 DAYS PRIOR TO TEST- Trulicity (dulaglutide) Ozempic, Wegovy (semaglutide) Mounjaro (tirzepatide) Bydureon Bcise (exanatide extended release)  DO NOT TAKE 1 DAY PRIOR TO YOUR TEST Rybelsus (semaglutide) Adlyxin (lixisenatide) Victoza (liraglutide) Byetta (exanatide) ___________________________________________________________________________  _______________________________________________________  If your blood pressure at your visit was 140/90 or greater, please contact your primary care physician to follow up on this.  _______________________________________________________  If you are age 50 or older, your body mass index should be between 23-30. Your Body mass index is 28.37 kg/m. If this is out of the aforementioned range listed, please consider follow up with your Primary Care Provider.  If you are age 72 or younger, your body mass index should be between 19-25. Your Body mass index is 28.37 kg/m. If this is out of the aformentioned range listed, please consider follow up with your Primary Care Provider.   ________________________________________________________  The [facility] providers would like to encourage you to use [facility] to communicate with providers for non-urgent requests or questions.  Due to long hold times on the telephone, sending your provider a message by [facility] may be a faster and more efficient way to get a response.  Please allow 48 business hours for a response.  Please remember that this is for non-urgent requests.  _______________________________________________________ It was a pleasure to see you today!  Thank you for trusting me with your  gastrointestinal care!

## 2023-01-27 NOTE — Progress Notes (Deleted)
[facility] Consult Note:  History: Joshua Li 01/27/2023  Referring provider: Jeani Sow, MD  Reason for consult/chief complaint: No chief complaint on file.   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 Russella Dar for many years.  He appears to have had regular visits here until about 2007-2009 based on the listed encounters, but any notes from that predate this EMR.  He was last seen by Dr. Russella Dar for a routine pouchoscopy January 19, at which time a few pouch erosions/ulcers were found, and a 7 mm polyp was removed.  Pathology noted below, the polyp was inflammatory and erosion/ulcer biopsy showed some nonspecific chronic ileitis consistent with IBD. ______________________   ***   ROS:  Review of Systems   Past Medical History: Past Medical History:  Diagnosis Date   A-fib (HCC)    Actinic keratoses    Allergy    Asthma    GERD (gastroesophageal reflux disease)    Heart murmur    Hiatal hernia    Hyperlipidemia    Ulcerative colitis 2001   Status post total colectomy with J-pouch in 2003 -      Past Surgical History: Past Surgical History:  Procedure Laterality Date   CHOLECYSTECTOMY     pt not sure   COLECTOMY  2003   with one step take-down to J-pouch   COLECTOMY     COLECTOMY  2003   total colectomy with J pouch (one step procedure)   COLECTOMY  2003   total colectomy with j pouch ( one step)   TONSILLECTOMY     TOTAL COLECTOMY  2003   total colectomy with J pouch      Family History: Family History  Problem Relation Age of Onset   Cancer Mother        breast and ovarian   Heart attack Father 65   Stomach cancer Father 27       stomach & bladder -cause of death   Lymphoma Brother 59       Currently 43   Colon cancer Neg Hx    Esophageal cancer Neg Hx    Pancreatic cancer Neg Hx    Rectal  cancer Neg Hx     Social History: Social History   Socioeconomic History   Marital status: Media planner    Spouse name: Not on file   Number of children: 1   Years of education: 16   Highest education level: Bachelor's degree (e.g., BA, AB, BS)  Occupational History   Occupation: Retail    Comment: [employer]  Tobacco Use   Smoking status: Never   Smokeless tobacco: Never  Vaping Use   Vaping status: Never Used  Substance and Sexual Activity   Alcohol use: Yes    Alcohol/week: 2.0 standard drinks of alcohol    Types: 2 Cans of beer per week   Drug use: No   Sexual activity: Yes  Other Topics Concern   Not on file  Social History Narrative   Fun: Geocaching   Denies religious beliefs effecting health care.    Marketing/cust service   Social Determinants of Health   Financial Resource Strain: Not on file  Food Insecurity: No Food Insecurity (06/01/2022)   Hunger Vital Sign    Worried About Running Out of Food in the Last Year: Never true    Ran Out of Food in the Last  Year: Never true  Transportation Needs: No Transportation Needs (06/01/2022)   PRAPARE - Administrator, Civil Service (Medical): No    Lack of Transportation (Non-Medical): No  Physical Activity: Insufficiently Active (02/10/2017)   Exercise Vital Sign    Days of Exercise per Week: 1 day    Minutes of Exercise per Session: 30 min  Stress: Not on file  Social Connections: Not on file    Allergies: No Known Allergies  Outpatient Meds: Current Outpatient Medications  Medication Sig Dispense Refill   albuterol (VENTOLIN HFA) 108 (90 Base) MCG/ACT inhaler INHALE 2 PUFFS INTO THE LUNGS EVERY 6 (SIX) HOURS AS NEEDED FOR WHEEZING OR SHORTNESS OF BREATH. 24 g 3   apixaban (ELIQUIS) 5 MG TABS tablet Take 1 tablet (5 mg total) by mouth 2 (two) times daily. 180 tablet 3   cetirizine (ZYRTEC) 10 MG tablet Take 10 mg by mouth daily.     Cholecalciferol (VITAMIN D) 50 MCG (2000 UT) CAPS Take 2,000  Units by mouth daily.     flecainide (TAMBOCOR) 50 MG tablet TAKE 1 TABLET BY MOUTH TWICE A DAY 180 tablet 2   KLOR-CON M10 10 MEQ tablet TAKE 1 TABLET BY MOUTH 2 TIMES DAILY. 180 tablet 1   metoprolol succinate (TOPROL-XL) 25 MG 24 hr tablet Take 1 tablet (25 mg total) by mouth daily. 90 tablet 3   pantoprazole (PROTONIX) 40 MG tablet TAKE 1 TABLET DAILY. 90 tablet 3   rosuvastatin (CRESTOR) 20 MG tablet TAKE 1 TABLET DAILY 90 tablet 3   No current facility-administered medications for this visit.      ___________________________________________________________________ Objective   Exam:  There were no vitals taken for this visit. Wt Readings from Last 3 Encounters:  01/13/23 229 lb 9.6 oz (104.1 kg)  06/29/22 230 lb (104.3 kg)  06/10/22 233 lb (105.7 kg)    General: ***  Eyes: sclera anicteric, no redness ENT: oral mucosa moist without lesions, no cervical or supraclavicular lymphadenopathy CV: ***, no JVD, no peripheral edema Resp: clear to auscultation bilaterally, normal RR and effort noted GI: soft, *** tenderness, with active bowel sounds. No guarding or palpable organomegaly noted. Skin; warm and dry, no rash or jaundice noted Neuro: awake, alert and oriented x 3. Normal gross motor function and fluent speech  Data:  1. Surgical [P], ileum, polyp - INFLAMMATORY PSEUDOPOLYP. - NEGATIVE FOR DYSPLASIA. 2. Surgical [P], ileum - MILDLY ACTIVE CHRONIC NONSPECIFIC ILEITIS, CONSISTENT WITH PATIENT'S CLINICAL HISTORY OF INFLAMMATORY BOWEL DISEASE. - NEGATIVE FOR GRANULOMAS OR DYSPLASIA.  Assessment: No diagnosis found.  ***  Plan:  ***  Thank you for the courtesy of this consult.  Please call me with any questions or concerns.  Charlie Pitter III  CC: Referring provider noted above

## 2023-01-30 ENCOUNTER — Telehealth: Payer: Self-pay

## 2023-01-30 NOTE — Telephone Encounter (Signed)
[facility] Pre-operative Risk Assessment     Request for surgical clearance:     Endoscopy Procedure  What type of surgery is being performed?     Colonoscopy( pouchoscopy)   When is this surgery scheduled?     03/16/2023  What type of clearance is required ?   Pharmacy  Are there any medications that need to be held prior to surgery and how long? Eliquis- 2 day hold   Practice name and name of physician performing surgery?      [facility]  What is your office phone and fax number?      Phone- [phone]  Fax- [phone]  Anesthesia type (None, local, MAC, general) ?       MAC

## 2023-02-02 ENCOUNTER — Telehealth: Payer: Self-pay

## 2023-02-02 NOTE — Telephone Encounter (Signed)
   Patient Name: Joshua Li  DOB: 30-Apr-1960 MRN: [id number]  Primary Cardiologist: None  Clinical pharmacists have reviewed the patient's past medical history, labs, and current medications as part of preoperative protocol coverage. The following recommendations have been made:   Per office protocol, patient can hold Eliquis for 2 days prior to procedure.   Patient will not need bridging with Lovenox (enoxaparin) around procedure.  I will route this recommendation to the requesting party via Epic fax function and remove from pre-op pool.  Please call with questions.  Napoleon Form, Leodis Rains, NP 02/02/2023, 12:08 PM

## 2023-02-02 NOTE — Telephone Encounter (Signed)
Patient with diagnosis of atrial fibrillation on Eliquis for anticoagulation.    What type of surgery is being performed?     Colonoscopy( pouchoscopy)   When is this surgery scheduled?     03/16/2023    CHA2DS2-VASc Score = 2   This indicates a 2.2% annual risk of stroke. The patient's score is based upon: CHF History: 0 HTN History: 0 Diabetes History: 0 Stroke History: 2 Vascular Disease History: 0 Age Score: 0 Gender Score: 0    CrCl 88 Platelet count 210  Per office protocol, patient can hold Eliquis for 2 days prior to procedure.   Patient will not need bridging with Lovenox (enoxaparin) around procedure.  **This guidance is not considered finalized until pre-operative APP has relayed final recommendations.**

## 2023-02-02 NOTE — Telephone Encounter (Signed)
Called patient and notified him of 2 day hold.

## 2023-02-05 NOTE — Progress Notes (Unsigned)
Cardiology Office Note:  .   Date:  02/05/2023  ID:  Joshua Li, DOB 08/20/1960, MRN [id number] PCP: Jeani Sow, MD  [facility] Providers Cardiologist:  None Electrophysiologist:  Will Jorja Loa, MD {  History of Present Illness: .   Joshua Li is a 62 y.o. 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 Elberta Fortis 06/10/22, recent hospitalization with arm numbness and found with stroke > started on [facility] Stable EKG intervals Minimal arrhythmia/ectopy burden No changes made  Pending GI w/u : pending -pouchoscopy  Pre-team has addressed hold 2 days [facility]  Today's visit is scheduled as a 6 mo visit  ROS:   He is doing well Had a day with Afib last week Thursday, thinks may have been triggered by second hand smoke exposure, perhaps allergies with the leaves, using his albuterol Lasted most of the day, HR tends to be fast in AFib, feels fatigue with it, not overtly SOB. Reports episode of Afib as rare  No CP, SOB Dizzy spells, near syncope or syncope No bleeding or signs of bleeding Not very active, by choice    Arrhythmia/AAD hx AFib (and PATs, PVCs and flutter) by monitoring 2018 Flecainide started 2019  Studies Reviewed: Marland Kitchen    EKG done today and reviewed by myself:  SB 53bpm, normal intervals, PR  In my review of prior EKG, PR interval stable   06/01/22: TTE 1. Left ventricular ejection fraction, by estimation, is 55 to 60%. The  left ventricle has normal function. The left ventricle has no regional  wall motion abnormalities. Left ventricular diastolic parameters are  consistent with Grade I diastolic  dysfunction (impaired relaxation).   2. Right ventricular systolic function is normal. The right ventricular  size is normal. Tricuspid regurgitation signal is inadequate for assessing  PA pressure.   3. The mitral valve is normal in  structure. No evidence of mitral valve  regurgitation. No evidence of mitral stenosis.   4. The aortic valve is tricuspid. Aortic valve regurgitation is trivial.  No aortic stenosis is present.   5. Aortic dilatation noted. There is mild dilatation of the aortic root,  measuring 39 mm.   6. The inferior vena cava is dilated in size with >50% respiratory  variability, suggesting right atrial pressure of 8 mmHg.    06/15/2017: ETT Blood pressure demonstrated a normal response to exercise. Upsloping ST segment depression ST segment depression of 1 mm was noted during stress in the II, III, aVF, V5 and V6 leads. Positive ETT 1 mm upsloping ST segment depression in inferior lateral leads with stress Normal hemodynamic response   05/24/2017: stress myoview There was no ST segment deviation noted during stress. AFIB. This is a low risk study. No perfusion defects. No ischemia.  Risk Assessment/Calculations:    Physical Exam:   VS:  There were no vitals taken for this visit.   Wt Readings from Last 3 Encounters:  01/27/23 227 lb (103 kg)  01/13/23 229 lb 9.6 oz (104.1 kg)  06/29/22 230 lb (104.3 kg)    GEN: Well nourished, well developed in no acute distress NECK: No JVD; No carotid bruits CARDIAC: RRR, no murmurs, rubs, gallops RESPIRATORY:  CTA b/l without rales, wheezing or rhonchi  ABDOMEN: Soft, non-tender, non-distended EXTREMITIES:  No edema; No deformity    ASSESSMENT AND PLAN: .    paroxysmal AFib AFlutter CHA2DS2Vasc is 2 (stroke), on Eliquis, appropriately  dosed 3. PVCs 4. PAT Flecainide and metoprolol w/stable intervals low burden by symptoms  Needs a recheck on his lytes/lab since starting K+  Secondary hypercoagulable state 2/2 AFib     Dispo: back with EP in 53mo again, sooner if needed  Signed, Sheilah Pigeon, PA-C

## 2023-02-07 ENCOUNTER — Ambulatory Visit: Payer: 59 | Attending: Cardiology | Admitting: Physician Assistant

## 2023-02-07 ENCOUNTER — Encounter: Payer: Self-pay | Admitting: Physician Assistant

## 2023-02-07 VITALS — BP 108/66 | HR 53 | Ht 75.0 in | Wt 227.0 lb

## 2023-02-07 DIAGNOSIS — I480 Paroxysmal atrial fibrillation: Secondary | ICD-10-CM

## 2023-02-07 DIAGNOSIS — I4892 Unspecified atrial flutter: Secondary | ICD-10-CM | POA: Diagnosis not present

## 2023-02-07 DIAGNOSIS — I4719 Other supraventricular tachycardia: Secondary | ICD-10-CM | POA: Diagnosis not present

## 2023-02-07 DIAGNOSIS — Z79899 Other long term (current) drug therapy: Secondary | ICD-10-CM

## 2023-02-07 DIAGNOSIS — Z5181 Encounter for therapeutic drug level monitoring: Secondary | ICD-10-CM

## 2023-02-07 DIAGNOSIS — I493 Ventricular premature depolarization: Secondary | ICD-10-CM

## 2023-02-07 DIAGNOSIS — D6869 Other thrombophilia: Secondary | ICD-10-CM | POA: Diagnosis not present

## 2023-02-07 NOTE — Patient Instructions (Addendum)
Medication Instructions:  Your physician recommends that you continue on your current medications as directed. Please refer to the Current Medication list given to you today.  *If you need a refill on your cardiac medications before your next appointment, please call your pharmacy*   Lab Work: TODAY: BMET If you have labs (blood work) drawn today and your tests are completely normal, you will receive your results only by: MyChart Message (if you have MyChart) OR A paper copy in the mail If you have any lab test that is abnormal or we need to change your treatment, we will call you to review the results.   Testing/Procedures: EKG   Follow-Up: At [facility], you and your health needs are our priority.  As part of our continuing mission to provide you with exceptional heart care, we have created designated Provider Care Teams.  These Care Teams include your primary Cardiologist (physician) and Advanced Practice Providers (APPs -  Physician Assistants and Nurse Practitioners) who all work together to provide you with the care you need, when you need it.  We recommend signing up for the patient portal called "MyChart".  Sign up information is provided on this After Visit Summary.  MyChart is used to connect with patients for Virtual Visits (Telemedicine).  Patients are able to view lab/test results, encounter notes, upcoming appointments, etc.  Non-urgent messages can be sent to your provider as well.   To learn more about what you can do with MyChart, go to [email or web address].    Your next appointment:   6 month(s)  Provider:   DR. Elberta Fortis OR RENEE URSUY, PA-C

## 2023-02-08 LAB — BASIC METABOLIC PANEL
BUN/Creatinine Ratio: 12 (ref 10–24)
BUN: 15 mg/dL (ref 8–27)
CO2: 23 mmol/L (ref 20–29)
Calcium: 8.7 mg/dL (ref 8.6–10.2)
Chloride: 105 mmol/L (ref 96–106)
Creatinine, Ser: 1.3 mg/dL — ABNORMAL HIGH (ref 0.76–1.27)
Glucose: 98 mg/dL (ref 70–99)
Potassium: 3.8 mmol/L (ref 3.5–5.2)
Sodium: 140 mmol/L (ref 134–144)
eGFR: 61 mL/min/{1.73_m2} (ref >59)

## 2023-02-20 ENCOUNTER — Encounter: Payer: Self-pay | Admitting: Gastroenterology

## 2023-03-06 ENCOUNTER — Encounter: Payer: Self-pay | Admitting: Gastroenterology

## 2023-03-14 ENCOUNTER — Other Ambulatory Visit: Payer: Self-pay

## 2023-03-14 ENCOUNTER — Emergency Department (HOSPITAL_COMMUNITY)
Admission: EM | Admit: 2023-03-14 | Discharge: 2023-03-14 | Disposition: A | Discharge status: Home / Self Care | Payer: 59 | Attending: Emergency Medicine | Admitting: Emergency Medicine

## 2023-03-14 ENCOUNTER — Encounter (HOSPITAL_COMMUNITY): Payer: Self-pay | Admitting: Emergency Medicine

## 2023-03-14 ENCOUNTER — Emergency Department (HOSPITAL_COMMUNITY): Payer: 59

## 2023-03-14 ENCOUNTER — Telehealth: Payer: Self-pay | Admitting: Gastroenterology

## 2023-03-14 DIAGNOSIS — Z7901 Long term (current) use of anticoagulants: Secondary | ICD-10-CM | POA: Insufficient documentation

## 2023-03-14 DIAGNOSIS — H539 Unspecified visual disturbance: Secondary | ICD-10-CM | POA: Diagnosis not present

## 2023-03-14 DIAGNOSIS — H538 Other visual disturbances: Secondary | ICD-10-CM | POA: Diagnosis not present

## 2023-03-14 DIAGNOSIS — H5310 Unspecified subjective visual disturbances: Secondary | ICD-10-CM | POA: Diagnosis not present

## 2023-03-14 HISTORY — DX: Transient cerebral ischemic attack, unspecified: G45.9

## 2023-03-14 LAB — BASIC METABOLIC PANEL
Anion gap: 8 (ref 5–15)
BUN: 13 mg/dL (ref 8–23)
CO2: 25 mmol/L (ref 22–32)
Calcium: 9 mg/dL (ref 8.9–10.3)
Chloride: 107 mmol/L (ref 98–111)
Creatinine, Ser: 1.29 mg/dL — ABNORMAL HIGH (ref 0.61–1.24)
GFR, Estimated: >60 mL/min (ref >60)
Glucose, Bld: 88 mg/dL (ref 70–99)
Potassium: 3.4 mmol/L — ABNORMAL LOW (ref 3.5–5.1)
Sodium: 140 mmol/L (ref 135–145)

## 2023-03-14 LAB — CBC WITH DIFFERENTIAL/PLATELET
Abs Immature Granulocytes: 0.05 10*3/uL (ref 0.00–0.07)
Basophils Absolute: 0 10*3/uL (ref 0.0–0.1)
Basophils Relative: 0 %
Eosinophils Absolute: 0.2 10*3/uL (ref 0.0–0.5)
Eosinophils Relative: 2 %
HCT: 42.8 % (ref 39.0–52.0)
Hemoglobin: 15 g/dL (ref 13.0–17.0)
Immature Granulocytes: 1 %
Lymphocytes Relative: 36 %
Lymphs Abs: 3.4 10*3/uL (ref 0.7–4.0)
MCH: 29.4 pg (ref 26.0–34.0)
MCHC: 35 g/dL (ref 30.0–36.0)
MCV: 83.8 fL (ref 80.0–100.0)
Monocytes Absolute: 0.5 10*3/uL (ref 0.1–1.0)
Monocytes Relative: 5 %
Neutro Abs: 5.2 10*3/uL (ref 1.7–7.7)
Neutrophils Relative %: 56 %
Platelets: 235 10*3/uL (ref 150–400)
RBC: 5.11 MIL/uL (ref 4.22–5.81)
RDW: 13 % (ref 11.5–15.5)
WBC: 9.4 10*3/uL (ref 4.0–10.5)
nRBC: 0 % (ref 0.0–0.2)

## 2023-03-14 NOTE — ED Provider Triage Note (Signed)
Emergency Medicine Provider Triage Evaluation Note  Joshua Li , a 62 y.o. male  was evaluated in triage.  Pt complains of a brief episode where he had trouble with his vision.  He describes being at work and looking at computers, and having a period where with his right eye it appeared as though his vision was very close, his left eye was very far.  Symptoms then resolved.  Prior history of TIA.  Was taken off of Eliquis for colonoscopy this week.  Review of Systems  Positive:  Negative:   Physical Exam  BP (!) 157/87   Pulse (!) 50   Temp 98.1 F (36.7 C) (Oral)   Resp 18   Ht 6\' 3"  (1.905 m)   Wt 98.4 kg   SpO2 100%   BMI 27.12 kg/m  Gen:   Awake, no distress   Resp:  Normal effort  MSK:   Moves extremities without difficulty  Other:    Medical Decision Making  Medically screening exam initiated at 11:43 AM.  Appropriate orders placed.  EZANA BLOMME was informed that the remainder of the evaluation will be completed by another provider, this initial triage assessment does not replace that evaluation, and the importance of remaining in the ED until their evaluation is complete.  No deficits at this time, no numbness, no weakness, no visual disturbances.  MRI ordered as I believe CT imaging will likely be negative.   Anders Simmonds T, DO 03/14/23 1147

## 2023-03-14 NOTE — Discharge Instructions (Signed)
Make an appointment to follow back up with your neurologist.  Resume your Eliquis when you can after the colonoscopy.  Potassium is not significantly low here today.  Make an appointment to follow-up with ophthalmology to have the eyes and redness more closely looked at.  Information provided above.  Return for any new or worse symptoms at all.

## 2023-03-14 NOTE — ED Triage Notes (Addendum)
Pt. Stated, I was sitting at my desk this morning and on the phone and all of sudden the computer screen I was looking at was moving forward and back. Both eyes were at different focal points. No other symptoms. I had a TIA this year Mar. 3, it was numbness of left arm and lasted about 30 min. I was put on Elaquis  and was put off it for a day and half for a colonoscopy Im having it on Thursday. Pt Alert and oriented x 3 , No weakness, symmetrical smile.

## 2023-03-14 NOTE — Telephone Encounter (Signed)
Pt stated that he was having blurred vision this AM.  Currently in the ED and just had MRI completed.  Please advise on procedure scheduled for 03/16/2023. Pt was notified that I would follow up with him in the morning for a symptom update.

## 2023-03-14 NOTE — Telephone Encounter (Signed)
Left message for pt to call back  °

## 2023-03-14 NOTE — Telephone Encounter (Signed)
Inbound call from patient stating he checked himself into the ED due to vision complications. Patient is requesting a call to discuss how to proceed with 12/19 colonoscopy. Please advise, thank you.

## 2023-03-14 NOTE — ED Provider Notes (Signed)
[place] [facility] AT [facility] Provider Note   CSN: [id number] Arrival date & time: 03/14/23  1058     History  Chief Complaint  Patient presents with   Eye Problem    Joshua Li is a 62 y.o. male.  Patient earlier today while looking at his for computer screens that he has at home and noticed that the depth perception seemed to be off there was some trouble with the vision.  Only lasted about a minute the longest.  Patient did have a history of TIA back in March that that resulted in left arm numbness that has since resolved.  He is followed by he thinks [facility]y for that.  Patient is off his Eliquis because he starts bowel prep for colonoscopy tomorrow.  Patient also normally on potassium supplementation.  Potassium here is 3.4 so not significantly low.  Patient had MRI ordered and results are pending.  Patient completely asymptomatic.       Home Medications Prior to Admission medications   Medication Sig Start Date End Date Taking? Authorizing Provider  albuterol (VENTOLIN HFA) 108 (90 Base) MCG/ACT inhaler INHALE 2 PUFFS INTO THE LUNGS EVERY 6 (SIX) HOURS AS NEEDED FOR WHEEZING OR SHORTNESS OF BREATH. 04/09/21   Corwin Levins, MD  apixaban (ELIQUIS) 5 MG TABS tablet Take 1 tablet (5 mg total) by mouth 2 (two) times daily. 06/10/22   Camnitz, Andree Coss, MD  cetirizine (ZYRTEC) 10 MG tablet Take 10 mg by mouth daily.    [provider]  Cholecalciferol (VITAMIN D) 50 MCG (2000 UT) CAPS Take 2,000 Units by mouth daily. 04/29/19   [provider]  flecainide (TAMBOCOR) 50 MG tablet TAKE 1 TABLET BY MOUTH TWICE A DAY 08/19/22   Camnitz, Will Daphine Deutscher, MD  KLOR-CON M10 10 MEQ tablet TAKE 1 TABLET BY MOUTH 2 TIMES DAILY. 01/23/23   Jeani Sow, MD  metoprolol succinate (TOPROL-XL) 25 MG 24 hr tablet Take 1 tablet (25 mg total) by mouth daily. 06/10/22   Camnitz, Will Daphine Deutscher, MD  pantoprazole (PROTONIX) 40 MG tablet TAKE 1 TABLET  DAILY. 08/29/22   Jeani Sow, MD  rosuvastatin (CRESTOR) 20 MG tablet TAKE 1 TABLET DAILY 06/17/22   Corwin Levins, MD      Allergies    Patient has no known allergies.    Review of Systems   Review of Systems  Constitutional:  Negative for chills and fever.  HENT:  Negative for ear pain and sore throat.   Eyes:  Positive for visual disturbance. Negative for pain.  Respiratory:  Negative for cough and shortness of breath.   Cardiovascular:  Negative for chest pain and palpitations.  Gastrointestinal:  Negative for abdominal pain and vomiting.  Genitourinary:  Negative for dysuria and hematuria.  Musculoskeletal:  Negative for arthralgias and back pain.  Skin:  Negative for color change and rash.  Neurological:  Negative for seizures and syncope.  All other systems reviewed and are negative.   Physical Exam Updated Vital Signs BP 102/68   Pulse (!) 41   Temp 98.3 F (36.8 C)   Resp 19   Ht 1.905 m (6\' 3" )   Wt 98.4 kg   SpO2 98%   BMI 27.12 kg/m  Physical Exam Vitals and nursing note reviewed.  Constitutional:      General: He is not in acute distress.    Appearance: He is well-developed.  HENT:     Head: Normocephalic and atraumatic.  Eyes:     Conjunctiva/sclera: Conjunctivae normal.  Cardiovascular:     Rate and Rhythm: Normal rate and regular rhythm.     Heart sounds: No murmur heard. Pulmonary:     Effort: Pulmonary effort is normal. No respiratory distress.     Breath sounds: Normal breath sounds.  Abdominal:     Palpations: Abdomen is soft.     Tenderness: There is no abdominal tenderness.  Musculoskeletal:        General: No swelling.     Cervical back: Neck supple.  Skin:    General: Skin is warm and dry.     Capillary Refill: Capillary refill takes less than 2 seconds.  Neurological:     General: No focal deficit present.     Mental Status: He is alert and oriented to person, place, and time.     Cranial Nerves: No cranial nerve deficit.      Sensory: No sensory deficit.     Motor: No weakness.     Coordination: Coordination normal.  Psychiatric:        Mood and Affect: Mood normal.     ED Results / Procedures / Treatments   Labs (all labs ordered are listed, but only abnormal results are displayed) Labs Reviewed  BASIC METABOLIC PANEL - Abnormal; Notable for the following components:      Result Value   Potassium 3.4 (*)    Creatinine, Ser 1.29 (*)    All other components within normal limits  CBC WITH DIFFERENTIAL/PLATELET    EKG None  Radiology MR BRAIN WO CONTRAST Result Date: 03/14/2023 CLINICAL DATA:  Episode of vision trouble EXAM: MRI HEAD WITHOUT CONTRAST TECHNIQUE: Multiplanar, multiecho pulse sequences of the brain and surrounding structures were obtained without intravenous contrast. COMPARISON:  05/31/2022 FINDINGS: Brain: No restricted diffusion to suggest acute or subacute infarct. No acute hemorrhage, mass, mass effect, or midline shift. No hydrocephalus or extra-axial collection. Pituitary and craniocervical junction within normal limits. No hemosiderin deposition to suggest remote hemorrhage. Vascular: Normal arterial flow voids. Skull and upper cervical spine: Normal marrow signal. Sinuses/Orbits: Mucous retention cysts in the left maxillary sinus. Mucosal thickening in the ethmoid air cells and right sphenoid sinus. No acute finding in the orbits. Other: Trace fluid in the mastoid air cells. IMPRESSION: No acute intracranial process. No etiology is seen for the patient's symptoms. Electronically Signed   By: Wiliam Ke M.D.   On: 03/14/2023 17:33    Procedures Procedures    Medications Ordered in ED Medications - No data to display  ED Course/ Medical Decision Making/ A&P                                 Medical Decision Making Amount and/or Complexity of Data Reviewed Labs: ordered. Radiology: ordered.   Patient now completely asymptomatic.  The event only lasted for about admitted and  it was sort of the depth perception was off.  Patient does have potassium supplementation at home potassium here today was 3.4 not significantly abnormal renal function normal CBC normal.  MRI brain without any acute findings at all.  Will have him follow-up with ophthalmology just to check the retina well.  I will also have him follow back up with neurology thinks he is followed by [facility] neurology had a TIA back in March which led to numbness in the left arm.  Nothing with any visual stuff.  Patient has been off his  Eliquis because he is got bowel prep starting tomorrow for colonoscopy that is fine.  Patient will return for any new or worse symptoms or any similar symptoms reoccurring. Final Clinical Impression(s) / ED Diagnoses Final diagnoses:  Visual changes    Rx / DC Orders ED Discharge Orders     None         Vanetta Mulders, MD 03/14/23 2009

## 2023-03-15 ENCOUNTER — Encounter: Payer: Self-pay | Admitting: Certified Registered Nurse Anesthetist

## 2023-03-15 ENCOUNTER — Encounter: Payer: 59 | Admitting: Gastroenterology

## 2023-03-15 ENCOUNTER — Telehealth: Payer: Self-pay | Admitting: Neurology

## 2023-03-15 NOTE — Telephone Encounter (Signed)
Call to patient, he is in agreement with Dr, Karie Georges recommendations and in agreement for 1 year follow up scheduled.

## 2023-03-15 NOTE — Telephone Encounter (Signed)
Please call patient, informed him that I have reviewed the ED notes, no need for urgent visit. He should follow up with ophthalmology and restart the Eliquis as soon as his procedure.

## 2023-03-15 NOTE — Telephone Encounter (Signed)
Pt stated that he is doing well this morning and that the MRI done yesterday was negative and the hospital Dr's told him to follow through with the prep today and the scheduled colonoscopy.  Routed as FYI. Please advise of other recommendations

## 2023-03-15 NOTE — Telephone Encounter (Signed)
Pt states he was in the the ER due to double vision and was concerned it was an stroke. MRI came back clear but was told to f/u with his neurologist. Requesting call back to discuss options and concerns

## 2023-03-15 NOTE — Telephone Encounter (Signed)
Thank you for the note.  I reviewed the ED provider's note and the MRI report, and I agree we can proceed with the colonoscopy as scheduled tomorrow.   H Danis

## 2023-03-16 ENCOUNTER — Ambulatory Visit (AMBULATORY_SURGERY_CENTER): Payer: 59 | Admitting: Gastroenterology

## 2023-03-16 ENCOUNTER — Encounter: Payer: Self-pay | Admitting: Gastroenterology

## 2023-03-16 VITALS — BP 100/59 | HR 41 | Temp 97.1°F | Resp 13 | Ht 75.0 in | Wt 227.0 lb

## 2023-03-16 DIAGNOSIS — K620 Anal polyp: Secondary | ICD-10-CM

## 2023-03-16 DIAGNOSIS — K5000 Crohn's disease of small intestine without complications: Secondary | ICD-10-CM

## 2023-03-16 DIAGNOSIS — Z8719 Personal history of other diseases of the digestive system: Secondary | ICD-10-CM | POA: Diagnosis not present

## 2023-03-16 DIAGNOSIS — E785 Hyperlipidemia, unspecified: Secondary | ICD-10-CM | POA: Diagnosis not present

## 2023-03-16 DIAGNOSIS — I4891 Unspecified atrial fibrillation: Secondary | ICD-10-CM | POA: Diagnosis not present

## 2023-03-16 DIAGNOSIS — K5190 Ulcerative colitis, unspecified, without complications: Secondary | ICD-10-CM | POA: Diagnosis not present

## 2023-03-16 MED ORDER — SODIUM CHLORIDE 0.9 % IV SOLN: 500.0000 mL | Freq: Once | INTRAVENOUS | Status: DC

## 2023-03-16 NOTE — Progress Notes (Signed)
History and Physical:  This patient presents for endoscopic testing for: Encounter Diagnosis  Name Primary?   History of ulcerative colitis Yes    Clinical details in 01/27/23 office consult note. Pouchoscopy to evaluate any IBD activity in patient with prior total proctocolectomy and J pouch  Patient is otherwise without complaints or active issues today.   Past Medical History: Past Medical History:  Diagnosis Date   A-fib (HCC)    Actinic keratoses    Allergy    Asthma    GERD (gastroesophageal reflux disease)    Heart murmur    Hiatal hernia    Hyperlipidemia    TIA (transient ischemic attack)    Ulcerative colitis 2001   Status post total colectomy with J-pouch in 2003 -      Past Surgical History: Past Surgical History:  Procedure Laterality Date   CHOLECYSTECTOMY     pt not sure   COLECTOMY  2003   with one step take-down to J-pouch   COLECTOMY     COLECTOMY  2003   total colectomy with J pouch (one step procedure)   COLECTOMY  2003   total colectomy with j pouch ( one step)   TONSILLECTOMY     TOTAL COLECTOMY  2003   total colectomy with J pouch     Allergies: No Known Allergies  Outpatient Meds: Current Outpatient Medications  Medication Sig Dispense Refill   albuterol (VENTOLIN HFA) 108 (90 Base) MCG/ACT inhaler INHALE 2 PUFFS INTO THE LUNGS EVERY 6 (SIX) HOURS AS NEEDED FOR WHEEZING OR SHORTNESS OF BREATH. 24 g 3   flecainide (TAMBOCOR) 50 MG tablet TAKE 1 TABLET BY MOUTH TWICE A DAY 180 tablet 2   metoprolol succinate (TOPROL-XL) 25 MG 24 hr tablet Take 1 tablet (25 mg total) by mouth daily. 90 tablet 3   pantoprazole (PROTONIX) 40 MG tablet TAKE 1 TABLET DAILY. 90 tablet 3   rosuvastatin (CRESTOR) 20 MG tablet TAKE 1 TABLET DAILY 90 tablet 3   apixaban (ELIQUIS) 5 MG TABS tablet Take 1 tablet (5 mg total) by mouth 2 (two) times daily. 180 tablet 3   cetirizine (ZYRTEC) 10 MG tablet Take 10 mg by mouth daily.     Cholecalciferol (VITAMIN D) 50  MCG (2000 UT) CAPS Take 2,000 Units by mouth daily.     KLOR-CON M10 10 MEQ tablet TAKE 1 TABLET BY MOUTH 2 TIMES DAILY. 180 tablet 1   Current Facility-Administered Medications  Medication Dose Route Frequency Provider Last Rate Last Admin   0.9 %  sodium chloride infusion  500 mL Intravenous Once Danis, Starr [place] III, MD          ___________________________________________________________________ Objective   Exam:  BP (!) 140/85   Pulse (!) 50   Temp (!) 97.1 F (36.2 C) (Temporal)   Ht 6\' 3"  (1.905 m)   Wt 227 lb (103 kg)   SpO2 98%   BMI 28.37 kg/m   CV: regular , S1/S2 Resp: clear to auscultation bilaterally, normal RR and effort noted GI: soft, no tenderness, with active bowel sounds.   Assessment: Encounter Diagnosis  Name Primary?   History of ulcerative colitis Yes     Plan: Pouchoscopy   The benefits and risks of the planned procedure were described in detail with the patient or (when appropriate) their health care proxy.  Risks were outlined as including, but not limited to, bleeding, infection, perforation, adverse medication reaction leading to cardiac or pulmonary decompensation, pancreatitis (if ERCP).  The limitation  of incomplete mucosal visualization was also discussed.  No guarantees or warranties were given.  The patient is appropriate for an endoscopic procedure in the ambulatory setting.   - Amada Jupiter, MD

## 2023-03-16 NOTE — Progress Notes (Signed)
1352 HR < 40 with Robinul 0.2 mg given IV. MD updated, vss

## 2023-03-16 NOTE — Op Note (Signed)
[facility] Patient Name: Joshua Li Procedure Date: 03/16/2023 1:24 PM MRN: [id number] Endoscopist: Sherilyn Cooter L. Myrtie Neither , MD, [id number] Age: 62 Referring MD:  Date of Birth: [birth year] Gender: Male Account #: [id number] Procedure:                Pouchoscopy Indications:              History of total proctocolectomy, Inflammatory                            bowel disease                           last exam 2019 with mild distal pouch inflammation                            and an inflammatory polyp Medicines:                Monitored Anesthesia Care Procedure:                Pre-Anesthesia Assessment:                           - Prior to the procedure, a History and Physical                            was performed, and patient medications and                            allergies were reviewed. The patient's tolerance of                            previous anesthesia was also reviewed. The risks                            and benefits of the procedure and the sedation                            options and risks were discussed with the patient.                            All questions were answered, and informed consent                            was obtained. Prior Anticoagulants: The patient has                            taken Eliquis (apixaban), last dose was 2 days                            prior to procedure. ASA Grade Assessment: III - A                            patient with severe systemic disease. After  reviewing the risks and benefits, the patient was                            deemed in satisfactory condition to undergo the                            procedure.                           After obtaining informed consent, the endoscope was                            passed under direct vision. Throughout the                            procedure, the patient's blood pressure, pulse, and                            oxygen saturations were  monitored continuously. The                            Olympus CF-[id number] ([id number]) Colonoscope was                            introduced through the anus and advanced to the the                            neo-terminal ileum. After obtaining informed                            consent, the endoscope was passed under direct                            vision. Throughout the procedure, the patient's                            blood pressure, pulse, and oxygen saturations were                            monitored continuously.The procedure was performed                            without difficulty. The patient tolerated the                            procedure well. The quality of the bowel                            preparation was excellent. Scope In: 1:38:12 PM Scope Out: 1:59:44 PM Total Procedure Duration: 0 hours 21 minutes 32 seconds  Findings:                 Patient is status-post total colectomy with a  J-pouch surgical anastomosis. No peri-anal IBD                            activity.                           The digital rectal exam was notable for a palpable,                            soft and mobile lesion at the anal verge.(anterior)                           Mild chronic Inflammation was found in the ileoanal                            pouch in the most distal 1-2cm. The remainder of                            the pouch and the neo-terminal ileum were normal.                           The J-pouch contained one semi-pedunculated,                            non-bleeding polyp. The polyp was 10 mm in diameter                            present less then a cm from the anal verge. It had                            the typical appearance of an inflammatory polyp                            with surface ulceration. Area (base) was                            successfully injected with 1 mL of a 0.01 mg/mL                            solution of epinephrine  for drug delivery, with the                            initial intention of the performing a polypectomy.                            The site of injection oozed blood for about 30                            seconds before stopping. Out of concern for                            possibly causing post-polypectomy bleeding in a  patient on anticoagulation, no polypectomy was                            performed. Estimated blood loss was minimal. Complications:            No immediate complications. Estimated Blood Loss:     Estimated blood loss was minimal. Impression:               - Palpable anal verge inflammatory polyp.                           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 Typical post-surgical findings, stable                            from 2019 exam.                           - One inflammatory polyp in the ileoanal pouch.                            Injected, not removed.                           - No specimens collected. Recommendation:           - The patient will be observed post-procedure,                            until all discharge criteria are met.                           - Patient has a contact number available for                            emergencies. The signs and symptoms of potential                            delayed complications were discussed with the                            patient. Return to normal activities tomorrow.                            Written discharge instructions were provided to the                            patient.                           - Resume previous diet.                           - Resume Eliquis (apixaban) at prior dose tomorrow.                           -  This patient has not been experiencing chronic                            bleeding. If that should develop, then it would                            require a repeat pouchoscopy in the hospital                             outpatient endoscopy department with the                            availability of additional hemostasis techniques.                           - Repeat pouchoscopy in 3 years. Surya Folden L. Myrtie Neither, MD 03/16/2023 2:20:07 PM This report has been signed electronically.

## 2023-03-16 NOTE — Patient Instructions (Signed)
-   Resume previous diet. - Resume Eliquis (apixaban) at prior dose tomorrow.  - This patient has not been experiencing chronic bleeding. If that should develop, then it would require a repeat pouchoscopy in the hospital outpatient endoscopy department with the availability of additional hemostasis techniques.   YOU HAD AN ENDOSCOPIC PROCEDURE TODAY AT THE [facility]:   Refer to the procedure report that was given to you for any specific questions about what was found during the examination.  If the procedure report does not answer your questions, please call your gastroenterologist to clarify.  If you requested that your care partner not be given the details of your procedure findings, then the procedure report has been included in a sealed envelope for you to review at your convenience later.  YOU SHOULD EXPECT: Some feelings of bloating in the abdomen. Passage of more gas than usual.  Walking can help get rid of the air that was put into your GI tract during the procedure and reduce the bloating. If you had a lower endoscopy (such as a colonoscopy or flexible sigmoidoscopy) you may notice spotting of blood in your stool or on the toilet paper. If you underwent a bowel prep for your procedure, you may not have a normal bowel movement for a few days.  Please Note:  You might notice some irritation and congestion in your nose or some drainage.  This is from the oxygen used during your procedure.  There is no need for concern and it should clear up in a day or so.  SYMPTOMS TO REPORT IMMEDIATELY:  Following lower endoscopy (colonoscopy or flexible sigmoidoscopy):  Excessive amounts of blood in the stool  Significant tenderness or worsening of abdominal pains  Swelling of the abdomen that is new, acute  Fever of 100F or higher  For urgent or emergent issues, a gastroenterologist can be reached at any hour by calling [phone]. Do not use MyChart messaging for urgent concerns.     DIET:  We do recommend a small meal at first, but then you may proceed to your regular diet.  Drink plenty of fluids but you should avoid alcoholic beverages for 24 hours.  ACTIVITY:  You should plan to take it easy for the rest of today and you should NOT DRIVE or use heavy machinery until tomorrow (because of the sedation medicines used during the test).    FOLLOW UP: Our staff will call the number listed on your records the next business day following your procedure.  We will call around 7:15- 8:00 am to check on you and address any questions or concerns that you may have regarding the information given to you following your procedure. If we do not reach you, we will leave a message.     If any biopsies were taken you will be contacted by phone or by letter within the next 1-3 weeks.  Please call us at [phone] if you have not heard about the biopsies in 3 weeks.    SIGNATURES/CONFIDENTIALITY: You and/or your care partner have signed paperwork which will be entered into your electronic medical record.  These signatures attest to the fact that that the information above on your After Visit Summary has been reviewed and is understood.  Full responsibility of the confidentiality of this discharge information lies with you and/or your care-partner.

## 2023-03-17 ENCOUNTER — Telehealth: Payer: Self-pay | Admitting: *Deleted

## 2023-03-17 NOTE — Telephone Encounter (Signed)
  Follow up Call-     03/16/2023   12:42 PM  Call back number  Post procedure Call Back phone  # [phone]  Permission to leave phone message Yes     Patient questions:  Do you have a fever, pain , or abdominal swelling? No. Pain Score  0 *  Have you tolerated food without any problems? Yes.    Have you been able to return to your normal activities? Yes.    Do you have any questions about your discharge instructions: Diet   No. Medications  No. Follow up visit  No.  Do you have questions or concerns about your Care? No.  Actions: * If pain score is 4 or above: No action needed, pain <4.

## 2023-03-27 DIAGNOSIS — H5310 Unspecified subjective visual disturbances: Secondary | ICD-10-CM | POA: Diagnosis not present

## 2023-03-29 ENCOUNTER — Other Ambulatory Visit: Payer: Self-pay | Admitting: Cardiology

## 2023-04-14 ENCOUNTER — Ambulatory Visit (INDEPENDENT_AMBULATORY_CARE_PROVIDER_SITE_OTHER): Payer: 59 | Admitting: Family Medicine

## 2023-04-14 ENCOUNTER — Encounter: Payer: Self-pay | Admitting: Family Medicine

## 2023-04-14 VITALS — BP 135/79 | HR 51 | Temp 97.5°F | Resp 18 | Ht 75.0 in | Wt 222.1 lb

## 2023-04-14 DIAGNOSIS — Z23 Encounter for immunization: Secondary | ICD-10-CM

## 2023-04-14 DIAGNOSIS — Z0000 Encounter for general adult medical examination without abnormal findings: Secondary | ICD-10-CM | POA: Diagnosis not present

## 2023-04-14 MED ORDER — ROSUVASTATIN CALCIUM 20 MG PO TABS: 20.0000 mg | ORAL_TABLET | Freq: Every day | ORAL | 3 refills | Status: DC

## 2023-04-14 NOTE — Progress Notes (Signed)
Phone: [phone]   Subjective:  Patient 63 y.o. male presenting for annual physical.  Chief Complaint  Patient presents with   Annual Exam    CPE Fasting    Annual-exercising URI-for 1 wk, getting better. UC-diarrhea.    See problem oriented charting- ROS- ROS: Gen: no fever, chills  Skin: rash every Dec on legs only ENT: no ear pain, ear drainage, nasal congestion, rhinorrhea, sinus pressure, sore throat Eyes: no blurry vision, double vision Resp: no cough, wheeze,SOB CV: no CP, palpitations, LE edema,  GI: no heartburn, n/v//c, abd pain.  Chronic diarrhea GU: no dysuria, urgency, frequency, hematuria.  Occ slow stream MSK: no joint pain, myalgias, back pain Neuro: no dizziness, headache, weakness, vertigo Psych: no depression, anxiety, insomnia, SI   The following were reviewed and entered/updated in epic: Past Medical History:  Diagnosis Date   A-fib (HCC)    Actinic keratoses    Allergy 2000   seasonal allergies   Asthma    GERD (gastroesophageal reflux disease) 2000   Heart murmur    Hiatal hernia    Hyperlipidemia 2000   Stroke (HCC) 2024   TIA   TIA (transient ischemic attack)    Ulcerative colitis 2001   Status post total colectomy with J-pouch in 2003 -    Patient Active Problem List   Diagnosis Date Noted   Prediabetes 01/13/2023   Acute CVA (cerebrovascular accident) (HCC) 05/31/2022   Ulcerative colitis (HCC) 03/03/2022   Chronic mycotic otitis externa 09/09/2021   Neck mass 04/05/2021   Acute diffuse otitis externa of right ear 02/04/2021   Sinus bradycardia 10/26/2020   Renal insufficiency 04/13/2020   Blood pressure elevated without history of HTN 04/13/2020   Hyperglycemia 04/01/2020   Vitamin D deficiency 04/01/2020   Paroxysmal atrial fibrillation (HCC) 05/04/2017   HLD (hyperlipidemia)    Hiatal hernia    GERD (gastroesophageal reflux disease)    Non-sustained ventricular tachycardia (HCC) - 5-20 beat runs 02/27/2017   PAT  (paroxysmal atrial tachycardia) (HCC) - vs. >? 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   pt not sure   COLECTOMY  2003   with one step take-down to J-pouch   COLECTOMY     COLECTOMY  2003   total colectomy with J pouch (one step procedure)   COLECTOMY  2003   total colectomy with j pouch ( one step)   TONSILLECTOMY     TOTAL COLECTOMY  2003   total colectomy with J pouch     Family History  Problem Relation Age of Onset   Cancer Mother        breast and ovarian   Heart attack Father 7   Stomach cancer Father 65       stomach & bladder -cause of death   Heart disease Father    Lymphoma Brother 37  Currently 60   Cancer Brother    Colon cancer Neg Hx    Esophageal cancer Neg Hx    Pancreatic cancer Neg Hx    Rectal cancer Neg Hx    Colon polyps Neg Hx     Medications- reviewed and updated Current Outpatient Medications  Medication Sig Dispense Refill   albuterol (VENTOLIN HFA) 108 (90 Base) MCG/ACT inhaler INHALE 2 PUFFS INTO THE LUNGS EVERY 6 (SIX) HOURS AS NEEDED FOR WHEEZING OR SHORTNESS OF BREATH. 24 g 3   apixaban (ELIQUIS) 5 MG TABS tablet Take 1 tablet (5 mg total) by mouth 2 (two) times daily. 180 tablet 3   cetirizine (ZYRTEC) 10 MG tablet Take 10 mg by mouth daily.     Cholecalciferol  (VITAMIN D) 50 MCG (2000 UT) CAPS Take 2,000 Units by mouth daily.     flecainide (TAMBOCOR) 50 MG tablet TAKE 1 TABLET BY MOUTH TWICE A DAY 180 tablet 3   KLOR-CON M10 10 MEQ tablet TAKE 1 TABLET BY MOUTH 2 TIMES DAILY. 180 tablet 1   metoprolol succinate (TOPROL-XL) 25 MG 24 hr tablet Take 1 tablet (25 mg total) by mouth daily. 90 tablet 3   pantoprazole (PROTONIX) 40 MG tablet TAKE 1 TABLET DAILY. 90 tablet 3   rosuvastatin (CRESTOR) 20 MG tablet Take 1 tablet (20 mg total) by mouth daily. 90 tablet 3   No current facility-administered medications for this visit.    Allergies-reviewed and updated No Known Allergies  Social History   Social History Narrative   Fun: Geocaching   Denies religious beliefs effecting health care.    Marketing/cust service   Objective  Objective:  BP 135/79   Pulse (!) 51   Temp (!) 97.5 F (36.4 C) (Temporal)   Resp 18   Ht 6\' 3"  (1.905 m)   Wt 222 lb 2 oz (100.8 kg)   SpO2 97%   BMI 27.76 kg/m  Physical Exam  Gen: WDWN NAD HEENT: NCAT, conjunctiva not injected, sclera nonicteric TM WNL B, OP moist, no exudates  NECK:  supple, no thyromegaly, no nodes, no carotid bruits CARDIAC: brady RRR, S1S2+, no murmur. DP 2+B LUNGS: CTAB. No wheezes ABDOMEN:  BS+, soft, NTND, No HSM, no masses EXT:  no edema MSK: no gross abnormalities. MS 5/5 all 4 NEURO: A&O x3.  CN II-XII intact.  PSYCH: normal mood. Good eye contact  Some eczema lower legs Reviewed labs   Assessment and Plan   Health Maintenance counseling: 1. Anticipatory guidance: Patient counseled regarding regular dental exams q6 months, eye exams yearly, avoiding smoking and second hand smoke, limiting alcohol to 2 beverages per day.   2. Risk factor reduction:  Advised patient of need for regular exercise and diet rich in fruits and vegetables to reduce risk of heart attack and stroke. Exercise- +.   Wt Readings from Last 3 Encounters:  04/14/23 222 lb 2 oz (100.8 kg)  03/16/23 227 lb  (103 kg)  03/14/23 217 lb (98.4 kg)   3. Immunizations/screenings/ancillary studies Immunization History  Administered Date(s) Administered   Fluzone Influenza virus vaccine,trivalent (IIV3), split virus 03/03/2004, 04/23/2008   Influenza Split 12/26/2013   Influenza Whole 01/22/2007, 03/09/2009   Influenza,inj,Quad PF,6+ Mos 12/24/2015, 01/23/2017, 12/26/2019, 01/08/2022   Influenza-Unspecified 12/27/2014, 01/13/2018, 12/06/2020, 01/12/2023   Moderna Covid-19 Fall Seasonal Vaccine 46yrs & older 10/13/2022   Moderna Sars-Covid-2 Vaccination 06/07/2019, 07/05/2019   Novel Infuenza-h1n1-09 04/23/2008   PFIZER(Purple Top)SARS-COV-2 Vaccination 01/25/2020, 08/13/2020, 12/10/2020   Pfizer(Comirnaty)Fall Seasonal Vaccine 12 years  and older 10/14/2021   Tdap 06/29/2011, 04/11/2022   Zoster Recombinant(Shingrix) 08/13/2020, 12/06/2020   Health Maintenance Due  Topic Date Due   Pneumococcal Vaccine 62-69 Years old (1 of 2 - PCV) Never done    4. Skin cancer screening- Iadvised regular sunscreen use. Denies worrisome, changing, or new skin lesions.  5. Smoking associated screening: non smoker Wellness examination  Need for pneumococcal vaccination -     Pneumococcal conjugate vaccine 20-valent  Other orders -     Rosuvastatin Calcium; Take 1 tablet (20 mg total) by mouth daily.  Dispense: 90 tablet; Refill: 3   Wellness-antic guidance.  No labs as most have been done.    Recommended follow up: Return in about 1 year (around 04/13/2024) for annual physical.  Lab/Order associations:+ fasting  Angelena Sole, MD

## 2023-04-14 NOTE — Patient Instructions (Signed)
It was very nice to see you today!  Keep up the great work!   PLEASE NOTE:  If you had any lab tests please let us know if you have not heard back within a few days. You may see your results on MyChart before we have a chance to review them but we will give you a call once they are reviewed by us. If we ordered any referrals today, please let us know if you have not heard from their office within the next week.   Please try these tips to maintain a healthy lifestyle:  Eat most of your calories during the day when you are active. Eliminate processed foods including packaged sweets (pies, cakes, cookies), reduce intake of potatoes, white bread, white pasta, and white rice. Look for whole grain options, oat flour or almond flour.  Each meal should contain half fruits/vegetables, one quarter protein, and one quarter carbs (no bigger than a computer mouse).  Cut down on sweet beverages. This includes juice, soda, and sweet tea. Also watch fruit intake, though this is a healthier sweet option, it still contains natural sugar! Limit to 3 servings daily.  Drink at least 1 glass of water with each meal and aim for at least 8 glasses per day  Exercise at least 150 minutes every week.   

## 2023-04-27 ENCOUNTER — Other Ambulatory Visit: Payer: Self-pay | Admitting: Cardiology

## 2023-06-05 ENCOUNTER — Other Ambulatory Visit: Payer: Self-pay | Admitting: Internal Medicine

## 2023-06-09 ENCOUNTER — Other Ambulatory Visit: Payer: Self-pay | Admitting: Cardiology

## 2023-06-09 ENCOUNTER — Other Ambulatory Visit: Payer: Self-pay | Admitting: *Deleted

## 2023-06-09 DIAGNOSIS — I480 Paroxysmal atrial fibrillation: Secondary | ICD-10-CM

## 2023-06-09 MED ORDER — PANTOPRAZOLE SODIUM 40 MG PO TBEC: 40.0000 mg | DELAYED_RELEASE_TABLET | Freq: Every day | ORAL | 3 refills | Status: DC

## 2023-06-09 NOTE — Telephone Encounter (Signed)
 Eliquis 5mg  refill request received. Patient is 63 years old, weight-100.8kg, Crea-1.29 on 03/14/23, Diagnosis-Afib, and last seen by Francis Dowse on 02/07/23. Dose is appropriate based on dosing criteria. Will send in refill to requested pharmacy.

## 2023-07-17 ENCOUNTER — Encounter: Payer: Self-pay | Admitting: Dermatology

## 2023-07-17 ENCOUNTER — Ambulatory Visit (INDEPENDENT_AMBULATORY_CARE_PROVIDER_SITE_OTHER): Payer: 59 | Admitting: Dermatology

## 2023-07-17 VITALS — BP 95/76

## 2023-07-17 DIAGNOSIS — L814 Other melanin hyperpigmentation: Secondary | ICD-10-CM | POA: Diagnosis not present

## 2023-07-17 DIAGNOSIS — L821 Other seborrheic keratosis: Secondary | ICD-10-CM

## 2023-07-17 DIAGNOSIS — D2371 Other benign neoplasm of skin of right lower limb, including hip: Secondary | ICD-10-CM | POA: Diagnosis not present

## 2023-07-17 DIAGNOSIS — L570 Actinic keratosis: Secondary | ICD-10-CM | POA: Diagnosis not present

## 2023-07-17 DIAGNOSIS — L918 Other hypertrophic disorders of the skin: Secondary | ICD-10-CM

## 2023-07-17 DIAGNOSIS — L578 Other skin changes due to chronic exposure to nonionizing radiation: Secondary | ICD-10-CM | POA: Diagnosis not present

## 2023-07-17 DIAGNOSIS — D229 Melanocytic nevi, unspecified: Secondary | ICD-10-CM

## 2023-07-17 DIAGNOSIS — D2372 Other benign neoplasm of skin of left lower limb, including hip: Secondary | ICD-10-CM

## 2023-07-17 DIAGNOSIS — L853 Xerosis cutis: Secondary | ICD-10-CM

## 2023-07-17 DIAGNOSIS — D1801 Hemangioma of skin and subcutaneous tissue: Secondary | ICD-10-CM

## 2023-07-17 DIAGNOSIS — W908XXA Exposure to other nonionizing radiation, initial encounter: Secondary | ICD-10-CM

## 2023-07-17 DIAGNOSIS — Z1283 Encounter for screening for malignant neoplasm of skin: Secondary | ICD-10-CM

## 2023-07-17 DIAGNOSIS — B078 Other viral warts: Secondary | ICD-10-CM | POA: Diagnosis not present

## 2023-07-17 NOTE — Progress Notes (Signed)
 New Patient Visit   Subjective  Joshua Li is a 63 y.o. male who presents for the following: Skin Cancer Screening and Full Body Skin Exam - No history of skin cancer. He does have a history of an AK on his nose years ago. No family history of skin cancer. When he made his appointment, he had a spot on his left temple but it is now gone.  The patient presents for Total-Body Skin Exam (TBSE) for skin cancer screening and mole check. The patient has spots, moles and lesions to be evaluated, some may be new or changing and the patient may have concern these could be cancer.    The following portions of the chart were reviewed this encounter and updated as appropriate: medications, allergies, medical history  Review of Systems:  No other skin or systemic complaints except as noted in HPI or Assessment and Plan.  Objective  Well appearing patient in no apparent distress; mood and affect are within normal limits.  A full examination was performed including scalp, head, eyes, ears, nose, lips, neck, chest, axillae, abdomen, back, buttocks, bilateral upper extremities, bilateral lower extremities, hands, feet, fingers, toes, fingernails, and toenails. All findings within normal limits unless otherwise noted below.   Relevant physical exam findings are noted in the Assessment and Plan.  Left Temple Erythematous thin papules/macules with gritty scale.  Mid Back Verrucous papules   Assessment & Plan   SKIN CANCER SCREENING PERFORMED TODAY.  ACTINIC DAMAGE - Chronic condition, secondary to cumulative UV/sun exposure - diffuse scaly erythematous macules with underlying dyspigmentation - Recommend daily broad spectrum sunscreen SPF 30+ to sun-exposed areas, reapply every 2 hours as needed.  - Staying in the shade or wearing long sleeves, sun glasses (UVA+UVB protection) and wide brim hats (4-inch brim around the entire circumference of the hat) are also recommended for sun protection.  -  Call for new or changing lesions.  LENTIGINES, SEBORRHEIC KERATOSES, HEMANGIOMAS - Benign normal skin lesions - Benign-appearing - Call for any changes  MELANOCYTIC NEVI - Tan-brown and/or pink-flesh-colored symmetric macules and papules - Benign appearing on exam today - Observation - Call clinic for new or changing moles - Recommend daily use of broad spectrum spf 30+ sunscreen to sun-exposed areas.   DERMATOFIBROMA Exam: Firm pink/brown papulenodule with dimple sign of left leg and right leg. Treatment Plan: A dermatofibroma is a benign growth possibly related to trauma, such as an insect bite, cut from shaving, or inflamed acne-type bump.  Treatment options to remove include shave or excision with resulting scar and risk of recurrence.  Since benign-appearing and not bothersome, will observe for now.   Xerosis - diffuse xerotic patches - recommend gentle, hydrating skin care - Moisturizer daily.  Acrochordons (Skin Tags) - Fleshy, skin-colored pedunculated papules of groin area. - Benign appearing.  - Observe. - If desired, they can be removed with an in office procedure that is not covered by insurance. - Please call the clinic if you notice any new or changing lesions.    AK (ACTINIC KERATOSIS) Left Temple Destruction of lesion - Left Temple Complexity: simple   Destruction method: cryotherapy   Informed consent: discussed and consent obtained   Timeout:  patient name, date of birth, surgical site, and procedure verified Lesion destroyed using liquid nitrogen: Yes   Region frozen until ice ball extended beyond lesion: Yes   Outcome: patient tolerated procedure well with no complications   Post-procedure details: wound care instructions given   OTHER VIRAL  WARTS Mid Back Destruction of lesion - Mid Back Complexity: simple   Destruction method: cryotherapy   Informed consent: discussed and consent obtained   Timeout:  patient name, date of birth, surgical site, and  procedure verified Lesion destroyed using liquid nitrogen: Yes   Region frozen until ice ball extended beyond lesion: Yes   Outcome: patient tolerated procedure well with no complications   Post-procedure details: wound care instructions given    Return in about 1 year (around 07/16/2024) for TBSE.  I, Eliot Guernsey, CMA, am acting as scribe for [employer], DO .   Documentation: I have reviewed the above documentation for accuracy and completeness, and I agree with the above.  Louana Roup, DO

## 2023-07-17 NOTE — Patient Instructions (Signed)

## 2023-07-22 ENCOUNTER — Other Ambulatory Visit: Payer: Self-pay | Admitting: Family Medicine

## 2023-07-22 DIAGNOSIS — E876 Hypokalemia: Secondary | ICD-10-CM

## 2023-09-12 ENCOUNTER — Encounter: Payer: Self-pay | Admitting: Neurology

## 2023-09-12 ENCOUNTER — Ambulatory Visit (INDEPENDENT_AMBULATORY_CARE_PROVIDER_SITE_OTHER): Payer: 59 | Admitting: Neurology

## 2023-09-12 VITALS — BP 144/81 | HR 44 | Resp 17 | Ht 75.0 in | Wt 232.0 lb

## 2023-09-12 DIAGNOSIS — I63411 Cerebral infarction due to embolism of right middle cerebral artery: Secondary | ICD-10-CM

## 2023-09-12 DIAGNOSIS — I480 Paroxysmal atrial fibrillation: Secondary | ICD-10-CM | POA: Diagnosis not present

## 2023-09-12 NOTE — Progress Notes (Signed)
 [facility]  PATIENT: Joshua Li Patient DOB: 1961/03/14  REQUESTING CLINICIAN: Christel Cousins, MD HISTORY FROM: Patient and chart review  REASON FOR VISIT: Stroke, follow up    HISTORICAL  CHIEF COMPLAINT:  Chief Complaint  Patient presents with   Cerebrovascular Accident    Rm13, alone, ZOX:WRUEA well and has no concerns with vision anymore    INTERVAL HISTORY 09/12/2023 Patient presents today for follow-up, last visit was a year ago, since then he has been doing well.  He was seen in the ED in December for double vision, MRI brain negative.  Tells me at that time he has stopped his Eliquis  pending colonoscopy.  He did follow-up with ophthalmology, was told that everything was normal except mild cataract.  Currently no symptoms, doing well, he was noted to have bradycardia but patient tells me this is normal for him.    HISTORY OF PRESENT ILLNESS:  This is a 63 year old gentleman with the past medical history of atrial fibrillation on Eliquis  but previously not on anticoagulation, hyperlipidemia, who is presenting after being admitted in the hospital for left arm numbness.  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  He was started on Eliquis  to 5 mg twice daily.  By the time he left the hospital his symptoms were almost back to normal.  Since leaving the hospital, he has been doing very well, again back to his normal self.  The numbness resolved.  He denies any weakness at that time and currently does not have any weakness.  At present he does not have any symptoms.  He is doing well.   Hospital Summary and course  HPI: Joshua Li is a 63 y.o. male past medical history of atrial fibrillation not on anticoagulation, hyperlipidemia, presenting to the emergency room for evaluation of left arm numbness that started suddenly this afternoon at work.  Reports that he was in his office and had a sudden onset of left arm  numbness when he returned from lunch around 1 PM.  Symptoms persisted that made him come to the emergency room.  Had an episode of palpitations of heart rate up to 145 bpm that lasted for a while.  No prior history of stroke symptoms.  CHA2DS2-VASc score was 0 until now hence not on anticoagulation. No prior history of MIs.  No recent illnesses or sicknesses. On review of systems, other than documented in the HPI, has a history of excessive snoring.  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  Symptoms currently resolved.  Did not receive any tPA as patient was outside of the window.. CT of the head no acute abnormality CTA head and neck unremarkable for any large vessel occlusion MRI brain right posterior central gyrus punctate infarct 2D Echo EF 55 to 60% LDL 69 on statin. HgbA1c 5.4 Aspirin  81 mg daily prior to admission, now on Eliquis  (apixaban ) daily for stroke prevention  OTHER MEDICAL CONDITIONS: Atrial fibrillation on Eliquis , Hyperlipidemia    REVIEW OF SYSTEMS: Full 14 system review of systems performed and negative with exception of: As noted in the HPI   ALLERGIES: No Known Allergies  HOME MEDICATIONS: Outpatient Medications Prior to Visit  Medication Sig Dispense Refill   albuterol  (VENTOLIN  HFA) 108 (90 Base) MCG/ACT inhaler INHALE 2 PUFFS INTO THE LUNGS EVERY 6 (SIX) HOURS AS  NEEDED FOR WHEEZING OR SHORTNESS OF BREATH. 24 g 3   apixaban  (ELIQUIS ) 5 MG TABS tablet TAKE 1 TABLET BY MOUTH TWICE A DAY 60 tablet 5   cetirizine (ZYRTEC) 10 MG tablet Take 10 mg by mouth daily.     Cholecalciferol (VITAMIN D ) 50 MCG (2000 UT) CAPS Take 2,000 Units by mouth daily.     flecainide  (TAMBOCOR ) 50 MG tablet TAKE 1 TABLET BY MOUTH TWICE A DAY 180 tablet 3   metoprolol  succinate (TOPROL -XL) 25 MG 24 hr tablet TAKE 1 TABLET (25 MG TOTAL) BY MOUTH  DAILY. 90 tablet 3   pantoprazole  (PROTONIX ) 40 MG tablet Take 1 tablet (40 mg total) by mouth daily. 90 tablet 3   potassium chloride  (KLOR-CON  M) 10 MEQ tablet TAKE 1 TABLET BY MOUTH TWICE A DAY 180 tablet 0   rosuvastatin  (CRESTOR ) 20 MG tablet Take 1 tablet (20 mg total) by mouth daily. 90 tablet 3   No facility-administered medications prior to visit.    PAST MEDICAL HISTORY: Past Medical History:  Diagnosis Date   A-fib (HCC)    Actinic keratoses    Allergy  2000   seasonal allergies   Asthma    GERD (gastroesophageal reflux disease) 2000   Heart murmur    Hiatal hernia    Hyperlipidemia 2000   Stroke [facility]) 2024   TIA   TIA (transient ischemic attack)    Ulcerative colitis 2001   Status post total colectomy with J-pouch in 2003 -     PAST SURGICAL HISTORY: Past Surgical History:  Procedure Laterality Date   CHOLECYSTECTOMY  ???   pt not sure   COLECTOMY  2003   with one step take-down to J-pouch   COLECTOMY     COLECTOMY  2003   total colectomy with J pouch (one step procedure)   COLECTOMY  2003   total colectomy with j pouch ( one step)   TONSILLECTOMY     TOTAL COLECTOMY  2003   total colectomy with J pouch     FAMILY HISTORY: Family History  Problem Relation Age of Onset   Cancer Mother        breast and ovarian   Heart attack Father 92   Stomach cancer Father 38       stomach & bladder -cause of death   Heart disease Father    Lymphoma Brother 47       Currently 75   Cancer Brother    Colon cancer Neg Hx    Esophageal cancer Neg Hx    Pancreatic cancer Neg Hx    Rectal cancer Neg Hx    Colon polyps Neg Hx     SOCIAL HISTORY: Social History   Socioeconomic History   Marital status: Media planner    Spouse name: Not on file   Number of children: 1   Years of education: 16   Highest education level: Bachelor's degree (e.g., BA, AB, BS)  Occupational History   Occupation: Retail    Comment: [employer]  Tobacco Use   Smoking status: Never    Smokeless tobacco: Never   Tobacco comments:    n/a  Vaping Use   Vaping status: Never Used  Substance and Sexual Activity   Alcohol use: Yes    Alcohol/week: 2.0 standard drinks of alcohol    Types: 2 Glasses of wine per week   Drug use: No   Sexual activity: Yes  Other Topics Concern   Not on file  Social History Narrative  Fun: Geocaching   Denies religious beliefs effecting health care.    Marketing/cust service   Social Drivers of Health   Financial Resource Strain: Not on file  Food Insecurity: No Food Insecurity (06/01/2022)   Hunger Vital Sign    Worried About Running Out of Food in the Last Year: Never true    Ran Out of Food in the Last Year: Never true  Transportation Needs: No Transportation Needs (06/01/2022)   PRAPARE - Administrator, Civil Service (Medical): No    Lack of Transportation (Non-Medical): No  Physical Activity: Insufficiently Active (02/10/2017)   Exercise Vital Sign    Days of Exercise per Week: 1 day    Minutes of Exercise per Session: 30 min  Stress: Not on file  Social Connections: Not on file  Intimate Partner Violence: Not At Risk (06/01/2022)   Humiliation, Afraid, Rape, and Kick questionnaire    Fear of Current or Ex-Partner: No    Emotionally Abused: No    Physically Abused: No    Sexually Abused: No     PHYSICAL EXAM  GENERAL EXAM/CONSTITUTIONAL: Vitals:  Vitals:   09/12/23 1136 09/12/23 1142  BP: (!) 152/84 (!) 144/81  Pulse: (!) 46 (!) 44  Resp: 17   SpO2: 96%   Weight: 232 lb (105.2 kg)   Height: 6' 3 (1.905 m)    Body mass index is 29 kg/m. Wt Readings from Last 3 Encounters:  09/12/23 232 lb (105.2 kg)  04/14/23 222 lb 2 oz (100.8 kg)  03/16/23 227 lb (103 kg)   Patient is in no distress; well developed, nourished and groomed; neck is supple  MUSCULOSKELETAL: Gait, strength, tone, movements noted in Neurologic exam below  NEUROLOGIC: MENTAL STATUS:      No data to display         awake,  alert, oriented to person, place and time recent and remote memory intact normal attention and concentration language fluent, comprehension intact, naming intact fund of knowledge appropriate  CRANIAL NERVE:  2nd, 3rd, 4th, 6th - Visual fields full to confrontation, extraocular muscles intact, no nystagmus 5th - facial sensation symmetric 7th - facial strength symmetric 8th - hearing intact 9th - palate elevates symmetrically, uvula midline 11th - shoulder shrug symmetric 12th - tongue protrusion midline  MOTOR:  normal bulk and tone, full strength in the BUE, BLE  SENSORY:  normal and symmetric to light touch  COORDINATION:  finger-nose-finger, fine finger movements normal  GAIT/STATION:  normal   DIAGNOSTIC DATA (LABS, IMAGING, TESTING) - I reviewed patient records, labs, notes, testing and imaging myself where available.  Lab Results  Component Value Date   WBC 9.4 03/14/2023   HGB 15.0 03/14/2023   HCT 42.8 03/14/2023   MCV 83.8 03/14/2023   PLT 235 03/14/2023      Component Value Date/Time   NA 140 03/14/2023 1626   NA 140 02/07/2023 0822   K 3.4 (L) 03/14/2023 1626   CL 107 03/14/2023 1626   CO2 25 03/14/2023 1626   GLUCOSE 88 03/14/2023 1626   BUN 13 03/14/2023 1626   BUN 15 02/07/2023 0822   CREATININE 1.29 (H) 03/14/2023 1626   CALCIUM  9.0 03/14/2023 1626   PROT 6.8 01/13/2023 1358   ALBUMIN 4.0 01/13/2023 1358   AST 14 01/13/2023 1358   ALT 15 01/13/2023 1358   ALKPHOS 45 01/13/2023 1358   BILITOT 0.7 01/13/2023 1358   GFRNONAA >60 03/14/2023 1626   GFRAA 92 02/06/2008 0907   Lab  Results  Component Value Date   CHOL 133 06/01/2022   HDL 47 06/01/2022   LDLCALC 69 06/01/2022   LDLDIRECT 123.3 03/02/2009   TRIG 84 06/01/2022   CHOLHDL 2.8 06/01/2022   Lab Results  Component Value Date   HGBA1C 5.7 01/13/2023   Lab Results  Component Value Date   VITAMINB12 590 01/13/2023   Lab Results  Component Value Date   TSH 2.07 04/11/2022     MRI Brain 05/31/2022 Punctate acute to subacute cortical infarct in the right postcentral gyrus.  CTA Head and Neck 05/31/2022 1. No intracranial large vessel occlusion or significant stenosis. 2. No hemodynamically significant stenosis in the neck  Echo 06/01/2022 1. Left ventricular ejection fraction, by estimation, is 55 to 60%. The left ventricle has normal function. The left ventricle has no regional wall motion abnormalities. Left ventricular diastolic parameters are consistent with Grade I diastolic dysfunction (impaired relaxation).  2. Right ventricular systolic function is normal. The right ventricular size is normal. Tricuspid regurgitation signal is inadequate for assessing PA pressure.  3. The mitral valve is normal in structure. No evidence of mitral valve regurgitation. No evidence of mitral stenosis.  4. The aortic valve is tricuspid. Aortic valve regurgitation is trivial. No aortic stenosis is present.  5. Aortic dilatation noted. There is mild dilatation of the aortic root, measuring 39 mm.  6. The inferior vena cava is dilated in size with >50% respiratory variability, suggesting right atrial pressure of 8 mmHg   ASSESSMENT AND PLAN  63 y.o. 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  He is doing well, compliant with his Eliquis , had double vision in December but MRI was negative for any acute stroke.  Currently has no complaints, no new symptoms or any concerns.  Plan will be for patient to continue current medications including Eliquis , continue to follow with PCP and return as needed.  He voiced understanding.  All other questions answered.   1. Cerebrovascular accident (CVA) due to embolism of right middle cerebral artery (HCC)   2. Paroxysmal atrial fibrillation [facility])       Patient Instructions  Continue current medications  Continue to follow up with  PCP  Return as needed   No orders of the defined types were placed in this encounter.   No orders of the defined types were placed in this encounter.   Return if symptoms worsen or fail to improve.  I personally spent a total of 30 minutes in the care of the patient today including preparing to see the patient, getting/reviewing separately obtained history, performing a medically appropriate exam/evaluation, counseling and educating, documenting clinical information in the EHR, independently interpreting results, and communicating results.   Cassandra Cleveland, MD 09/12/2023, 12:01 PM  [facility] [address] [place], [place] [address] [phone]

## 2023-09-12 NOTE — Patient Instructions (Addendum)
 Continue current medications  Continue to follow up with PCP  Return as needed

## 2023-09-13 DIAGNOSIS — S91311A Laceration without foreign body, right foot, initial encounter: Secondary | ICD-10-CM | POA: Diagnosis not present

## 2023-09-13 DIAGNOSIS — W208XXA Other cause of strike by thrown, projected or falling object, initial encounter: Secondary | ICD-10-CM | POA: Diagnosis not present

## 2023-09-14 ENCOUNTER — Other Ambulatory Visit (HOSPITAL_COMMUNITY): Payer: Self-pay

## 2023-09-14 ENCOUNTER — Telehealth: Payer: Self-pay

## 2023-09-14 NOTE — Telephone Encounter (Signed)
 Pharmacy Patient Advocate Encounter   Received notification from Onbase that prior authorization for PANTOPRAZOLE  40MG  CAPS is required/requested.   Insurance verification completed.   The patient is insured through [employer] [facility] .   Per test claim: PA required; PA submitted to above mentioned insurance via CoverMyMeds Key/confirmation #/EOC [id number] Status is pending

## 2023-09-14 NOTE — Telephone Encounter (Signed)
 Pharmacy Patient Advocate Encounter  Received notification from [employer] [facility] that Prior Authorization for Pantoprazole  Sodium 40MG  dr tablets has been APPROVED from 09/14/23 to 09/13/24   PA #/Case ID/Reference #: [id number]

## 2023-09-15 ENCOUNTER — Other Ambulatory Visit (HOSPITAL_COMMUNITY): Payer: Self-pay

## 2023-10-12 ENCOUNTER — Ambulatory Visit: Payer: Self-pay

## 2023-10-12 NOTE — Telephone Encounter (Signed)
 FYI  Patient scheduled with Worth Kitty, MD on 10/13/2023 at 10:20 AM

## 2023-10-12 NOTE — Telephone Encounter (Signed)
 FYI Only or Action Required?: FYI only for provider.  Patient was last seen in primary care on 04/14/2023 by Wendolyn Jenkins Jansky, MD.  Called Nurse Triage reporting Pain.  Symptoms began several weeks ago.  Interventions attempted: Nothing.  Symptoms are: unchanged.  Triage Disposition: See PCP When Office is Open (Within 3 Days)  Patient/caregiver understands and will follow disposition?: Yes, will follow disposition  Copied from CRM [id number]. Topic: Clinical - Red Word Triage >> Oct 12, 2023 11:20 AM Deleta RAMAN wrote: Red Word that prompted transfer to Nurse Triage: lower back, leg, and hip pain. Patient has been experiencing pain for about 6 weeks. Reason for Disposition  [1] MODERATE pain (e.g., interferes with normal activities) AND [2] present > 3 days  Answer Assessment - Initial Assessment Questions 1. ONSET: When did the muscle aches or body pains start?      6 weeks ago after lawn work 2. LOCATION: What part of your body is hurting? (e.g., entire body, arms, legs)      Back, leg, and hip pain 3. SEVERITY: How bad is the pain? (Scale 1-10; or mild, moderate, severe)     4 4. CAUSE: What do you think is causing the pains?     Unsure, denies injury 5. FEVER: Do you have a fever? If Yes, ask: What is your temperature, how was it measured, and  when did it start?      denies 6. OTHER SYMPTOMS: Do you have any other symptoms? (e.g., chest pain, cold or flu symptoms, rash, weakness, weight loss)     denies  Protocols used: Muscle Aches and Body Pain-A-AH

## 2023-10-13 ENCOUNTER — Encounter: Payer: Self-pay | Admitting: Family Medicine

## 2023-10-13 ENCOUNTER — Ambulatory Visit (INDEPENDENT_AMBULATORY_CARE_PROVIDER_SITE_OTHER): Admitting: Family Medicine

## 2023-10-13 ENCOUNTER — Other Ambulatory Visit: Payer: Self-pay | Admitting: Family Medicine

## 2023-10-13 ENCOUNTER — Ambulatory Visit: Payer: Self-pay | Admitting: Family Medicine

## 2023-10-13 VITALS — BP 120/80 | HR 64 | Temp 98.2°F | Ht 75.0 in | Wt 231.0 lb

## 2023-10-13 DIAGNOSIS — E876 Hypokalemia: Secondary | ICD-10-CM

## 2023-10-13 DIAGNOSIS — Z79899 Other long term (current) drug therapy: Secondary | ICD-10-CM

## 2023-10-13 DIAGNOSIS — M545 Low back pain, unspecified: Secondary | ICD-10-CM

## 2023-10-13 LAB — CBC WITH DIFFERENTIAL/PLATELET
Basophils Absolute: 0 K/uL (ref 0.0–0.1)
Basophils Relative: 0.6 % (ref 0.0–3.0)
Eosinophils Absolute: 0.1 K/uL (ref 0.0–0.7)
Eosinophils Relative: 1.6 % (ref 0.0–5.0)
HCT: 34.3 % — ABNORMAL LOW (ref 39.0–52.0)
Hemoglobin: 11.4 g/dL — ABNORMAL LOW (ref 13.0–17.0)
Lymphocytes Relative: 37.9 % (ref 12.0–46.0)
Lymphs Abs: 2 K/uL (ref 0.7–4.0)
MCHC: 33.1 g/dL (ref 30.0–36.0)
MCV: 80 fl (ref 78.0–100.0)
Monocytes Absolute: 0.4 K/uL (ref 0.1–1.0)
Monocytes Relative: 8.5 % (ref 3.0–12.0)
Neutro Abs: 2.7 K/uL (ref 1.4–7.7)
Neutrophils Relative %: 51.4 % (ref 43.0–77.0)
Platelets: 231 K/uL (ref 150.0–400.0)
RBC: 4.28 Mil/uL (ref 4.22–5.81)
RDW: 14.4 % (ref 11.5–15.5)
WBC: 5.3 K/uL (ref 4.0–10.5)

## 2023-10-13 LAB — LIPID PANEL
Cholesterol: 147 mg/dL (ref 0–200)
HDL: 59.1 mg/dL (ref >39.00)
LDL Cholesterol: 65 mg/dL (ref 0–99)
NonHDL: 87.87
Total CHOL/HDL Ratio: 2
Triglycerides: 116 mg/dL (ref 0.0–149.0)
VLDL: 23.2 mg/dL (ref 0.0–40.0)

## 2023-10-13 LAB — COMPREHENSIVE METABOLIC PANEL WITH GFR
ALT: 11 U/L (ref 0–53)
AST: 14 U/L (ref 0–37)
Albumin: 4 g/dL (ref 3.5–5.2)
Alkaline Phosphatase: 37 U/L — ABNORMAL LOW (ref 39–117)
BUN: 14 mg/dL (ref 6–23)
CO2: 26 meq/L (ref 19–32)
Calcium: 8.1 mg/dL — ABNORMAL LOW (ref 8.4–10.5)
Chloride: 108 meq/L (ref 96–112)
Creatinine, Ser: 1.22 mg/dL (ref 0.40–1.50)
GFR: 62.51 mL/min (ref >60.00)
Glucose, Bld: 94 mg/dL (ref 70–99)
Potassium: 3.7 meq/L (ref 3.5–5.1)
Sodium: 141 meq/L (ref 135–145)
Total Bilirubin: 0.6 mg/dL (ref 0.2–1.2)
Total Protein: 6.5 g/dL (ref 6.0–8.3)

## 2023-10-13 LAB — POTASSIUM: Potassium: 3.7 meq/L (ref 3.5–5.1)

## 2023-10-13 MED ORDER — PREDNISONE 50 MG PO TABS
ORAL_TABLET | ORAL | 0 refills | Status: DC
Start: 2023-10-13 — End: 2023-10-30

## 2023-10-13 MED ORDER — CYCLOBENZAPRINE HCL 10 MG PO TABS: 10.0000 mg | ORAL_TABLET | Freq: Three times a day (TID) | ORAL | 0 refills | Status: DC | PRN

## 2023-10-13 NOTE — Patient Instructions (Signed)
 It was very nice to see you today!  I think   Return if symptoms worsen or fail to improve.   Take care, Dr Kennyth  PLEASE NOTE:  If you had any lab tests, please let us  know if you have not heard back within a few days. You may see your results on mychart before we have a chance to review them but we will give you a call once they are reviewed by us .   If we ordered any referrals today, please let us  know if you have not heard from their office within the next week.   If you had any urgent prescriptions sent in today, please check with the pharmacy within an hour of our visit to make sure the prescription was transmitted appropriately.   Please try these tips to maintain a healthy lifestyle:  Eat at least 3 REAL meals and 1-2 snacks per day.  Aim for no more than 5 hours between eating.  If you eat breakfast, please do so within one hour of getting up.   Each meal should contain half fruits/vegetables, one quarter protein, and one quarter carbs (no bigger than a computer mouse)  Cut down on sweet beverages. This includes juice, soda, and sweet tea.   Drink at least 1 glass of water with each meal and aim for at least 8 glasses per day  Exercise at least 150 minutes every week.

## 2023-10-13 NOTE — Progress Notes (Signed)
   Joshua Li is a 63 y.o. male who presents today for an office visit.  Assessment/Plan:  Low back pain No red flags.  Likely muscular strain.  Cannot take NSAIDs due to being on Eliquis .  Will start prednisone  and Flexeril.  We discussed home exercises and stretching as well.  We did discuss plain film today however he would like to try medications for a week before pursuing this.  He will let me know or his PCP know if not improving in the next week or so.  If no improvement would consider imaging versus referral to PT or sports medicine at that time.  We discussed reasons to return to care.  Follow-up as needed.    Subjective:  HPI:  Patient here with back pain. This started about 6-8 weeks ago. Thinks that it started when cleaning gutters at home. He has tried using TENS unit with some improvement.  He has also developed some associated bilateral hip and shoulder pain as well.  Also noted some decreased mobility in his right leg.  Tried taking Tylenol  with some improvement.  He has had intermittent issues with back pain for the last several years.  He has had muscle relaxers in the past and has done well with this.  Occasionally has been getting more weakness in his left lower extremity.  No numbness or tingling.  No reported bowel or bladder incontinence.  No reported urinary retention.       Objective:  Physical Exam: BP 120/80   Pulse 64   Temp 98.2 F (36.8 C)   Ht 6' 3 (1.905 m)   Wt 231 lb (104.8 kg)   SpO2 97%   BMI 28.87 kg/m   Gen: No acute distress, resting comfortably CV: Regular rate and rhythm with no murmurs appreciated Pulm: Normal work of breathing, clear to auscultation bilaterally with no crackles, wheezes, or rhonchi MUSCULOSKELETAL - Back: No deformities.  Tender palpation along bilateral lower lumbar paraspinal muscle groups.  Neurovascular intact distally.  Slightly decreased range of motion with internal and external rotation of hips bilaterally.  No  bruising or other deformities noted. Neuro: Grossly normal, moves all extremities Psych: Normal affect and thought content      Shawnia Vizcarrondo M. Kennyth, MD 10/13/2023 10:07 AM

## 2023-10-13 NOTE — Progress Notes (Signed)
 Labs ok except anemia-has he had recent surgery?  Does he donate blood?  Any bleeding?   If not, repeat cbcd,b12 and iron studies 2 weeks

## 2023-10-16 ENCOUNTER — Other Ambulatory Visit: Payer: Self-pay | Admitting: *Deleted

## 2023-10-16 DIAGNOSIS — D649 Anemia, unspecified: Secondary | ICD-10-CM

## 2023-10-30 ENCOUNTER — Other Ambulatory Visit

## 2023-10-30 ENCOUNTER — Encounter: Payer: Self-pay | Admitting: Family Medicine

## 2023-10-30 ENCOUNTER — Ambulatory Visit (INDEPENDENT_AMBULATORY_CARE_PROVIDER_SITE_OTHER): Admitting: Family Medicine

## 2023-10-30 VITALS — BP 106/66 | HR 46 | Temp 97.5°F | Resp 18 | Ht 75.0 in | Wt 223.1 lb

## 2023-10-30 DIAGNOSIS — M25551 Pain in right hip: Secondary | ICD-10-CM

## 2023-10-30 DIAGNOSIS — D649 Anemia, unspecified: Secondary | ICD-10-CM | POA: Diagnosis not present

## 2023-10-30 DIAGNOSIS — M545 Low back pain, unspecified: Secondary | ICD-10-CM

## 2023-10-30 LAB — CBC WITH DIFFERENTIAL/PLATELET
Basophils Absolute: 0 K/uL (ref 0.0–0.1)
Basophils Relative: 0.4 % (ref 0.0–3.0)
Eosinophils Absolute: 0 K/uL (ref 0.0–0.7)
Eosinophils Relative: 0.8 % (ref 0.0–5.0)
HCT: 39 % (ref 39.0–52.0)
Hemoglobin: 12.8 g/dL — ABNORMAL LOW (ref 13.0–17.0)
Lymphocytes Relative: 35.8 % (ref 12.0–46.0)
Lymphs Abs: 2.1 K/uL (ref 0.7–4.0)
MCHC: 32.7 g/dL (ref 30.0–36.0)
MCV: 77.8 fl — ABNORMAL LOW (ref 78.0–100.0)
Monocytes Absolute: 0.4 K/uL (ref 0.1–1.0)
Monocytes Relative: 6.6 % (ref 3.0–12.0)
Neutro Abs: 3.3 K/uL (ref 1.4–7.7)
Neutrophils Relative %: 56.4 % (ref 43.0–77.0)
Platelets: 270 K/uL (ref 150.0–400.0)
RBC: 5.01 Mil/uL (ref 4.22–5.81)
RDW: 14.8 % (ref 11.5–15.5)
WBC: 5.8 K/uL (ref 4.0–10.5)

## 2023-10-30 LAB — VITAMIN B12: Vitamin B-12: 723 pg/mL (ref 211–911)

## 2023-10-30 LAB — IRON,TIBC AND FERRITIN PANEL
%SAT: 8 % — ABNORMAL LOW (ref 20–48)
Ferritin: 7 ng/mL — ABNORMAL LOW (ref 24–380)
Iron: 31 ug/dL — ABNORMAL LOW (ref 50–180)
TIBC: 365 ug/dL (ref 250–425)

## 2023-10-30 MED ORDER — PREDNISONE 50 MG PO TABS: ORAL_TABLET | ORAL | 0 refills | Status: DC

## 2023-10-30 NOTE — Progress Notes (Signed)
 Subjective:     Patient ID: Joshua Li, male    DOB: 1961-01-30, 63 y.o.   MRN: [id number]  Chief Complaint  Patient presents with   Back Pain    Still having back pain, saw Dr. Kennyth on 7/18 and symptoms have not improved very much Completed prednisone     HPI Discussed the use of AI scribe software for clinical note transcription with the patient, who gave verbal consent to proceed.  History of Present Illness Joshua Li is a 63 year old male with chronic back pain who presents with worsening back pain and left leg weakness.  He has experienced worsening back pain over the past six weeks, which began after performing yard work and working on gutters. The pain is described as a dull ache in the lower back, affecting both sides, and at its worst, causing hip pain B and left leg weakness, making it difficult to stand for prolonged periods. Initially, the pain improved with a course of prednisone  but returned after discontinuation.  He is unable to use NSAIDs like ibuprofen due to being on Eliquis , and Tylenol  has not provided relief. He uses a TENS unit for temporary pain relief. The patient reports no loss of bowel or bladder control, but does experience occasional difficulty urinating at night.  He has a history of a significant back injury approximately 30 years ago while lifting heavy speakers, which has contributed to ongoing back issues. He has previously undergone physical therapy following a back injury at work, which provided him with a TENS unit, but he has not continued the exercises.  He reports a new symptom of brief tingling in the right calf a few days ago. He has not been diagnosed with a herniated or slipped disc and has not pursued a full evaluation of his back recently. He is concerned about the left leg weakness and the prolonged duration of this flare-up compared to previous episodes.    There are no preventive care reminders to display for this patient.  Past  Medical History:  Diagnosis Date   A-fib (HCC)    Actinic keratoses    Allergy  2000   seasonal allergies   Asthma    GERD (gastroesophageal reflux disease) 2000   Heart murmur    Hiatal hernia    Hyperlipidemia 2000   Stroke [facility]) 2024   TIA   TIA (transient ischemic attack)    Ulcerative colitis 2001   Status post total colectomy with J-pouch in 2003 -     Past Surgical History:  Procedure Laterality Date   CHOLECYSTECTOMY  ???   pt not sure   COLECTOMY  2003   with one step take-down to J-pouch   COLECTOMY     COLECTOMY  2003   total colectomy with J pouch (one step procedure)   COLECTOMY  2003   total colectomy with j pouch ( one step)   TONSILLECTOMY     TOTAL COLECTOMY  2003   total colectomy with J pouch      Current Outpatient Medications:    albuterol  (VENTOLIN  HFA) 108 (90 Base) MCG/ACT inhaler, INHALE 2 PUFFS INTO THE LUNGS EVERY 6 (SIX) HOURS AS NEEDED FOR WHEEZING OR SHORTNESS OF BREATH., Disp: 24 g, Rfl: 3   apixaban  (ELIQUIS ) 5 MG TABS tablet, TAKE 1 TABLET BY MOUTH TWICE A DAY, Disp: 60 tablet, Rfl: 5   cetirizine (ZYRTEC) 10 MG tablet, Take 10 mg by mouth daily., Disp: , Rfl:    Cholecalciferol (VITAMIN  D) 50 MCG (2000 UT) CAPS, Take 2,000 Units by mouth daily., Disp: , Rfl:    cyclobenzaprine  (FLEXERIL ) 10 MG tablet, Take 1 tablet (10 mg total) by mouth 3 (three) times daily as needed for muscle spasms., Disp: 30 tablet, Rfl: 0   flecainide  (TAMBOCOR ) 50 MG tablet, TAKE 1 TABLET BY MOUTH TWICE A DAY, Disp: 180 tablet, Rfl: 3   metoprolol  succinate (TOPROL -XL) 25 MG 24 hr tablet, TAKE 1 TABLET (25 MG TOTAL) BY MOUTH DAILY., Disp: 90 tablet, Rfl: 3   pantoprazole  (PROTONIX ) 40 MG tablet, Take 1 tablet (40 mg total) by mouth daily., Disp: 90 tablet, Rfl: 3   potassium chloride  (KLOR-CON  M) 10 MEQ tablet, TAKE 1 TABLET BY MOUTH TWICE A DAY, Disp: 60 tablet, Rfl: 2   rosuvastatin  (CRESTOR ) 20 MG tablet, Take 1 tablet (20 mg total) by mouth daily., Disp: 90  tablet, Rfl: 3   predniSONE  (DELTASONE ) 50 MG tablet, Take 1 tablet daily for 5 days., Disp: 5 tablet, Rfl: 0  No Known Allergies ROS neg/noncontributory except as noted HPI/below      Objective:     BP 106/66   Pulse (!) 46   Temp (!) 97.5 F (36.4 C) (Temporal)   Resp 18   Ht 6' 3 (1.905 m)   Wt 223 lb 2 oz (101.2 kg)   SpO2 98%   BMI 27.89 kg/m  Wt Readings from Last 3 Encounters:  10/30/23 223 lb 2 oz (101.2 kg)  10/13/23 231 lb (104.8 kg)  09/12/23 232 lb (105.2 kg)    Physical Exam   Gen: WDWN NAD HEENT: NCAT, conjunctiva not injected, sclera nonicteric EXT:  no edema MSK: no gross abnormalities.  Some pain R hip w/motion. Back:  can stand on heels/toes/1 leg.  No TTP spine/paraspinous muscles. sl SI tenderness on L. No rash.  MS 5/5 BLE.  DTR flat+ BLE.  SLR neg B.  Good ROM-but some tightness  NEURO: A&O x3.  CN II-XII intact.  PSYCH: normal mood. Good eye contact     Assessment & Plan:  Low back pain, unspecified back pain laterality, unspecified chronicity, unspecified whether sciatica present -     Ambulatory referral to Physical Therapy  Pain of right hip  Anemia, unspecified type -     Iron, TIBC and Ferritin Panel -     Vitamin B12 -     CBC with Differential/Platelet  Other orders -     predniSONE ; Take 1 tablet daily for 5 days.  Dispense: 5 tablet; Refill: 0  Assessment and Plan Assessment & Plan Chronic low back pain with intermittent left leg weakness   Chronic low back pain worsens with physical activity and is accompanied by intermittent left leg weakness. Pain improved with prednisone  but returned after stopping. He reports new tingling in the right calf but no bowel or bladder dysfunction. Differential diagnosis includes possible disc herniation or degenerative changes, though no urgent intervention is needed. Symptoms may be age-related with slower recovery. Even if a disc issue is present, it often resolves with physical therapy and  time. Prescribe another course of prednisone  to manage inflammation and pain. Refer to physical therapy to improve strength and manage symptoms. Consider MRI if symptoms do not improve with physical therapy. Hold off on x-rays unless symptoms worsen or new symptoms develop.  Right hip osteoarthritis with pain   Right hip pain is likely due to osteoarthritis, contributing to overall discomfort and mobility issues. Physical examination reveals tenderness and pain in the  hip area. Include right hip in physical therapy regimen to address osteoarthritis symptoms.     Return if symptoms worsen or fail to improve.  Jenkins CHRISTELLA Carrel, MD

## 2023-10-30 NOTE — Patient Instructions (Signed)
 Prednisone    PT.  Let me know

## 2023-10-31 ENCOUNTER — Ambulatory Visit: Payer: Self-pay | Admitting: Family Medicine

## 2023-10-31 DIAGNOSIS — D649 Anemia, unspecified: Secondary | ICD-10-CM

## 2023-10-31 NOTE — Progress Notes (Signed)
 Spoke w/pt.  No bleeding.  Iron and stores low He will contact GI for appt. Start iron 325mg  daily and colace 100mg /day to prevent constipation Repeat labs in 3-4 wks  d/t his J pouch, he may or may not absorb well.

## 2023-12-04 NOTE — Therapy (Signed)
 OUTPATIENT PHYSICAL THERAPY LThoracolumbar EVALUATION   Patient Name: Joshua Li MRN: [id number] DOB:11-09-1960, 63 y.o., male Today's Date: 12/05/2023  END OF SESSION:  PT End of Session - 12/05/23 0726     Visit Number 1    Number of Visits 5    Date for PT Re-Evaluation 02/06/24    Authorization Type [employer]    PT Start Time 0715    PT Stop Time 0800    PT Time Calculation (min) 45 min    Activity Tolerance Patient tolerated treatment well    Behavior During Therapy [facility] for tasks assessed/performed          Past Medical History:  Diagnosis Date   A-fib (HCC)    Actinic keratoses    Allergy  2000   seasonal allergies   Asthma    GERD (gastroesophageal reflux disease) 2000   Heart murmur    Hiatal hernia    Hyperlipidemia 2000   Stroke (HCC) 2024   TIA   TIA (transient ischemic attack)    Ulcerative colitis 2001   Status post total colectomy with J-pouch in 2003 -    Past Surgical History:  Procedure Laterality Date   CHOLECYSTECTOMY  ???   pt not sure   COLECTOMY  2003   with one step take-down to J-pouch   COLECTOMY     COLECTOMY  2003   total colectomy with J pouch (one step procedure)   COLECTOMY  2003   total colectomy with j pouch ( one step)   TONSILLECTOMY     TOTAL COLECTOMY  2003   total colectomy with J pouch    Patient Active Problem List   Diagnosis Date Noted   Prediabetes 01/13/2023   Acute CVA (cerebrovascular accident) (HCC) 05/31/2022   Ulcerative colitis (HCC) 03/03/2022   Chronic mycotic otitis externa 09/09/2021   Neck mass 04/05/2021   Acute diffuse otitis externa of right ear 02/04/2021   Sinus bradycardia 10/26/2020   Renal insufficiency 04/13/2020   Blood pressure elevated without history of HTN 04/13/2020   Hyperglycemia 04/01/2020   Vitamin D  deficiency 04/01/2020   Paroxysmal atrial fibrillation (HCC) 05/04/2017   HLD (hyperlipidemia)    Hiatal hernia    GERD (gastroesophageal reflux disease)     Non-sustained ventricular tachycardia (HCC) - 5-20 beat runs 02/27/2017   PAT (paroxysmal atrial tachycardia) (HCC) - vs. >? 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 The last episode was approx 3 months ago after cleaning  the gutters. He notes he also experienced bilat hip pain and his L leg felt weak. After 2 rounds of prednisone  he states the pain is down to it's usually level dull ache, 1/10.    PERTINENT HISTORY: TIA, eliquis  PAIN:  Are you having pain? Yes: NPRS scale: 1/10; time of the incident 6/10 Pain location: Midline low back Pain description: dull ache  Aggravating factors: yard work, gardening, prolonged standing Relieving factors: Meds  PRECAUTIONS: None  RED FLAGS: None   WEIGHT BEARING RESTRICTIONS: No  FALLS:  Has patient fallen in last 6 months? No  LIVING ENVIRONMENT: Lives  with: lives with their family Lives in: House/apartment Able access home  OCCUPATION: Banker support- desk/computer  PLOF: Independent  PATIENT GOALS: healthy back exercises  NEXT MD VISIT: 04/16/24  OBJECTIVE:  Note: Objective measures were completed at Evaluation unless otherwise noted.  DIAGNOSTIC FINDINGS: None  PATIENT SURVEYS:  Modified Oswestry: 4/50=8%  Interpretation of scores: Score Category Description  0-20% Minimal Disability The patient can cope with most living activities. Usually no treatment is indicated apart from advice on lifting, sitting and exercise  21-40% Moderate Disability The patient experiences more pain and difficulty with sitting, lifting and standing. Travel and social life are more difficult and they may be disabled from work. Personal care, sexual activity and sleeping are not grossly affected, and the patient can usually be managed by conservative means  41-60% Severe Disability Pain remains the main problem in this group, but activities of daily living are affected. These patients require a detailed investigation  61-80% Crippled Back pain impinges on all aspects of the patient's life. Positive intervention is required  81-100% Bed-bound  These patients are either bed-bound or exaggerating their symptoms  Bluford FORBES Zoe DELENA Karon DELENA, et al. Surgery versus conservative management of stable thoracolumbar fracture: the PRESTO feasibility RCT. [place]): [employer]; 2021 Nov. [facility] Technology Assessment, No. 25.62.) Appendix 3, Oswestry Disability Index category descriptors. Available from: FindJewelers.cz  Minimally Clinically Important Difference (MCID) = 12.8%  COGNITION: Overall cognitive status: Within functional limits for tasks assessed     SENSATION: WFL  EDEMA:  None  MUSCLE LENGTH: Hamstrings: Right 40 deg; Left 40 deg Thomas test: Right WNLS deg; Left WNLS  deg  POSTURE: rounded shoulders and forward head  PALPATION: Not TTP  LUMBAR ROM:   AROM eval  Flexion 50% limited, tight low back and hamstrings  Extension 25% limited, tight low back  Right lateral flexion 25% limited, no pain  Left lateral flexion 50%, limited pain L low back  Right rotation Full, no pain  Left rotation Full, pain L low back   (Blank rows = not tested)  LOWER EXTREMITY ROM:     WFLS Active  Right eval Left eval  Hip flexion    Hip extension    Hip abduction    Hip adduction    Hip internal rotation    Hip external rotation    Knee flexion    Knee extension    Ankle dorsiflexion    Ankle plantarflexion    Ankle inversion    Ankle eversion     (Blank rows = not tested)  LOWER EXTREMITY MMT:    MMT Right eval Left eval  Hip flexion 4 4  Hip extension 3+ 3+  Hip abduction 4 4  Hip adduction    Hip internal rotation    Hip external rotation 4 4  Knee flexion    Knee extension  Ankle dorsiflexion    Ankle plantarflexion    Ankle inversion    Ankle eversion     (Blank rows = not tested)  LUMBAR SPECIAL TESTS:  Straight leg raise test: Negative and Slump test: Negative  FUNCTIONAL TESTS:  Sustained bridge tolerance= 30 Low plank form toes= 20  GAIT: Distance walked: 200' Assistive device utilized: None Level of assistance: Complete Independence Comments: WNLs                                                                                                                              TREATMENT DATE:  [facility] Adult PT Treatment:                                                DATE: 12/05/23 Therapeutic Exercise: Developed, instructed in, and pt completed therex as noted in HEP   PATIENT EDUCATION:  Education details: Eval findings, POC, HEP, self care  Person educated: Patient Education method: Explanation, Demonstration, Tactile cues, and Verbal cues Education comprehension: verbalized understanding, returned demonstration, verbal  cues required, and tactile cues required  HOME EXERCISE PROGRAM: Access Code: [email or web address] URL: [email or web address] Date: 12/05/2023 Prepared by: Dasie Daft  Exercises - Hooklying Hamstring Stretch with Strap  - 1 x daily - 7 x weekly - 1 sets - 3 reps - 30 hold - Single Knee to Chest Stretch  - 1 x daily - 7 x weekly - 1 sets - 3 reps - 30 hold - Supine Piriformis Stretch with Foot on Ground  - 1 x daily - 7 x weekly - 3 sets - 3 reps - 30 hold - Supine Transversus Abdominis Bracing - Hands on Ground  - 1 x daily - 7 x weekly - 1-2 sets - 10 reps - 3 hold - Supine Bridge  - 1 x daily - 7 x weekly - 1-2 sets - 10 reps - 5 hold - Supine 90/90 Alternating Heel Touches with Posterior Pelvic Tilt  - 1 x daily - 7 x weekly - 2-3 sets - 5-10 reps  ASSESSMENT:  CLINICAL IMPRESSION: Patient is a 63 y.o.,male who was seen today for physical therapy evaluation and treatment for M54.50 (ICD-10-CM) - Low back pain, unspecified back pain laterality, unspecified chronicity, unspecified whether sciatica present .   OBJECTIVE IMPAIRMENTS: decreased activity tolerance, decreased strength, and pain.   ACTIVITY LIMITATIONS: lifting, bending, and squatting  PARTICIPATION LIMITATIONS: cleaning and yard work  PERSONAL FACTORS: Fitness, Past/current experiences, and Time since onset of injury/illness/exacerbation are also affecting patient's functional outcome.   REHAB POTENTIAL: Good  CLINICAL DECISION MAKING: Stable/uncomplicated  EVALUATION COMPLEXITY: Low   GOALS:   SHORT TERM GOALS: Target date: 12/29/23 Pt will be Ind in an initial HEP  Baseline: started Goal status: INITIAL   LONG TERM GOALS: Target date: 02/06/24  Pt will be  Ind in a final HEP to maintain achieved LOF  Baseline:  Goal status: INITIAL  2.  Improved hip strength by  MM grade for improved function Baseline:  Goal status: INITIAL  3.  Improve core strength with pt sustaining a bridge for 60+ and a low  plank from toes for 40+  for improved tolerance of functional activities Baseline: 30 and 20 respectively Goal status: INITIAL  4.  Pt will report improved tolerance to physical activity, yard work and gardening, as indication of improved back function Baseline:  Goal status: INITIAL  5.  Pt will return proper demonstration of hinged hip lifting and household body mechanics to minimize back pain flare ups  Baseline:  Goal status: INITIAL  PLAN:  PT FREQUENCY: 1x/2 weeks   PT DURATION: 8 weeks  PLANNED INTERVENTIONS: [id number]- PT Re-evaluation, 97110-Therapeutic exercises, 97530- Therapeutic activity, 97112- Neuromuscular re-education, 97535- Self Care, [id number]- Manual therapy, [id number]- Aquatic Therapy, Taping, Joint mobilization, Cryotherapy, and Moist heat  PLAN FOR NEXT SESSION: Assess response to HEP; progress therex as indicated; use of modalities, manual therapy; and TPDN as indicated.   Niranjan Rufener MS, PT 12/05/23 10:55 AM

## 2023-12-05 ENCOUNTER — Ambulatory Visit: Payer: Self-pay | Attending: Family Medicine

## 2023-12-05 DIAGNOSIS — M6281 Muscle weakness (generalized): Secondary | ICD-10-CM | POA: Insufficient documentation

## 2023-12-05 DIAGNOSIS — M545 Low back pain, unspecified: Secondary | ICD-10-CM | POA: Insufficient documentation

## 2023-12-05 DIAGNOSIS — M5459 Other low back pain: Secondary | ICD-10-CM | POA: Insufficient documentation

## 2023-12-09 ENCOUNTER — Other Ambulatory Visit: Payer: Self-pay | Admitting: Cardiology

## 2023-12-09 DIAGNOSIS — I480 Paroxysmal atrial fibrillation: Secondary | ICD-10-CM

## 2023-12-11 NOTE — Telephone Encounter (Signed)
 Eliquis  5mg  refill request received. Patient is 63 years old, weight-101.2kg, Crea-1.22 on 10/13/23, Diagnosis-afib, and last seen by Charlies Arthur on 02/07/23. Dose is appropriate based on dosing criteria. Will send in refill to requested pharmacy.

## 2023-12-20 ENCOUNTER — Other Ambulatory Visit (INDEPENDENT_AMBULATORY_CARE_PROVIDER_SITE_OTHER)

## 2023-12-20 DIAGNOSIS — D649 Anemia, unspecified: Secondary | ICD-10-CM

## 2023-12-20 LAB — CBC WITH DIFFERENTIAL/PLATELET
Basophils Absolute: 0 K/uL (ref 0.0–0.1)
Basophils Relative: 0.5 % (ref 0.0–3.0)
Eosinophils Absolute: 0.1 K/uL (ref 0.0–0.7)
Eosinophils Relative: 2.2 % (ref 0.0–5.0)
HCT: 42.5 % (ref 39.0–52.0)
Hemoglobin: 14.1 g/dL (ref 13.0–17.0)
Lymphocytes Relative: 35.4 % (ref 12.0–46.0)
Lymphs Abs: 2.3 K/uL (ref 0.7–4.0)
MCHC: 33.3 g/dL (ref 30.0–36.0)
MCV: 80.3 fl (ref 78.0–100.0)
Monocytes Absolute: 0.4 K/uL (ref 0.1–1.0)
Monocytes Relative: 5.5 % (ref 3.0–12.0)
Neutro Abs: 3.7 K/uL (ref 1.4–7.7)
Neutrophils Relative %: 56.4 % (ref 43.0–77.0)
Platelets: 233 K/uL (ref 150.0–400.0)
RBC: 5.3 Mil/uL (ref 4.22–5.81)
RDW: 19.7 % — ABNORMAL HIGH (ref 11.5–15.5)
WBC: 6.6 K/uL (ref 4.0–10.5)

## 2023-12-20 LAB — IBC + FERRITIN
Ferritin: 25.2 ng/mL (ref 22.0–322.0)
Iron: 266 ug/dL — ABNORMAL HIGH (ref 42–165)
Saturation Ratios: 88.4 % — ABNORMAL HIGH (ref 20.0–50.0)
TIBC: 301 ug/dL (ref 250.0–450.0)
Transferrin: 215 mg/dL (ref 212.0–360.0)

## 2023-12-24 ENCOUNTER — Ambulatory Visit: Payer: Self-pay | Admitting: Family Medicine

## 2023-12-24 NOTE — Progress Notes (Signed)
 Improved.  Can decrease iron to every other day for 1 month(to build stores), then maybe vitamin w/iron to maintain

## 2023-12-28 ENCOUNTER — Encounter

## 2024-01-03 ENCOUNTER — Other Ambulatory Visit: Payer: Self-pay | Admitting: Family Medicine

## 2024-01-03 DIAGNOSIS — E876 Hypokalemia: Secondary | ICD-10-CM

## 2024-01-09 ENCOUNTER — Encounter

## 2024-03-16 ENCOUNTER — Encounter: Payer: Self-pay | Admitting: Family Medicine

## 2024-03-16 ENCOUNTER — Other Ambulatory Visit: Payer: Self-pay | Admitting: Cardiology

## 2024-03-16 ENCOUNTER — Other Ambulatory Visit: Payer: Self-pay | Admitting: Family Medicine

## 2024-03-18 ENCOUNTER — Other Ambulatory Visit: Payer: Self-pay

## 2024-03-18 MED ORDER — PANTOPRAZOLE SODIUM 40 MG PO TBEC: 40.0000 mg | DELAYED_RELEASE_TABLET | Freq: Every day | ORAL | 1 refills | Status: DC

## 2024-04-09 ENCOUNTER — Other Ambulatory Visit: Payer: Self-pay | Admitting: Family Medicine

## 2024-04-16 ENCOUNTER — Ambulatory Visit: Payer: 59 | Admitting: Family Medicine

## 2024-04-16 ENCOUNTER — Ambulatory Visit: Payer: Self-pay | Admitting: Family Medicine

## 2024-04-16 VITALS — BP 120/68 | HR 74 | Temp 97.2°F | Ht 75.0 in | Wt 230.1 lb

## 2024-04-16 DIAGNOSIS — E7800 Pure hypercholesterolemia, unspecified: Secondary | ICD-10-CM

## 2024-04-16 DIAGNOSIS — E559 Vitamin D deficiency, unspecified: Secondary | ICD-10-CM | POA: Diagnosis not present

## 2024-04-16 DIAGNOSIS — K5100 Ulcerative (chronic) pancolitis without complications: Secondary | ICD-10-CM

## 2024-04-16 DIAGNOSIS — I480 Paroxysmal atrial fibrillation: Secondary | ICD-10-CM | POA: Diagnosis not present

## 2024-04-16 DIAGNOSIS — E876 Hypokalemia: Secondary | ICD-10-CM

## 2024-04-16 DIAGNOSIS — R351 Nocturia: Secondary | ICD-10-CM

## 2024-04-16 DIAGNOSIS — K219 Gastro-esophageal reflux disease without esophagitis: Secondary | ICD-10-CM

## 2024-04-16 DIAGNOSIS — K5000 Crohn's disease of small intestine without complications: Secondary | ICD-10-CM

## 2024-04-16 DIAGNOSIS — Z0000 Encounter for general adult medical examination without abnormal findings: Secondary | ICD-10-CM

## 2024-04-16 DIAGNOSIS — I4719 Other supraventricular tachycardia: Secondary | ICD-10-CM

## 2024-04-16 DIAGNOSIS — Z79899 Other long term (current) drug therapy: Secondary | ICD-10-CM

## 2024-04-16 DIAGNOSIS — R7303 Prediabetes: Secondary | ICD-10-CM

## 2024-04-16 LAB — CBC WITH DIFFERENTIAL/PLATELET
Basophils Absolute: 0 K/uL (ref 0.0–0.1)
Basophils Relative: 0.5 % (ref 0.0–3.0)
Eosinophils Absolute: 0.1 K/uL (ref 0.0–0.7)
Eosinophils Relative: 1.8 % (ref 0.0–5.0)
HCT: 43.4 % (ref 39.0–52.0)
Hemoglobin: 15.2 g/dL (ref 13.0–17.0)
Lymphocytes Relative: 32.4 % (ref 12.0–46.0)
Lymphs Abs: 2.1 K/uL (ref 0.7–4.0)
MCHC: 35 g/dL (ref 30.0–36.0)
MCV: 86.9 fl (ref 78.0–100.0)
Monocytes Absolute: 0.5 K/uL (ref 0.1–1.0)
Monocytes Relative: 7.6 % (ref 3.0–12.0)
Neutro Abs: 3.7 K/uL (ref 1.4–7.7)
Neutrophils Relative %: 57.7 % (ref 43.0–77.0)
Platelets: 197 K/uL (ref 150.0–400.0)
RBC: 5 Mil/uL (ref 4.22–5.81)
RDW: 13.9 % (ref 11.5–15.5)
WBC: 6.4 K/uL (ref 4.0–10.5)

## 2024-04-16 LAB — COMPREHENSIVE METABOLIC PANEL WITH GFR
ALT: 16 U/L (ref 3–53)
AST: 14 U/L (ref 5–37)
Albumin: 4.1 g/dL (ref 3.5–5.2)
Alkaline Phosphatase: 42 U/L (ref 39–117)
BUN: 14 mg/dL (ref 6–23)
CO2: 25 meq/L (ref 19–32)
Calcium: 8.7 mg/dL (ref 8.4–10.5)
Chloride: 109 meq/L (ref 96–112)
Creatinine, Ser: 1.21 mg/dL (ref 0.40–1.50)
GFR: 62.91 mL/min
Glucose, Bld: 104 mg/dL — ABNORMAL HIGH (ref 70–99)
Potassium: 3.7 meq/L (ref 3.5–5.1)
Sodium: 140 meq/L (ref 135–145)
Total Bilirubin: 0.8 mg/dL (ref 0.2–1.2)
Total Protein: 6.6 g/dL (ref 6.0–8.3)

## 2024-04-16 LAB — LIPID PANEL
Cholesterol: 150 mg/dL (ref 28–200)
HDL: 52.5 mg/dL
LDL Cholesterol: 79 mg/dL (ref 10–99)
NonHDL: 97.44
Total CHOL/HDL Ratio: 3
Triglycerides: 90 mg/dL (ref 10.0–149.0)
VLDL: 18 mg/dL (ref 0.0–40.0)

## 2024-04-16 LAB — PSA: PSA: 1.41 ng/mL (ref 0.10–4.00)

## 2024-04-16 LAB — IBC + FERRITIN
Ferritin: 22.6 ng/mL (ref 22.0–322.0)
Iron: 107 ug/dL (ref 42–165)
Saturation Ratios: 36.2 % (ref 20.0–50.0)
TIBC: 295.4 ug/dL (ref 250.0–450.0)
Transferrin: 211 mg/dL — ABNORMAL LOW (ref 212.0–360.0)

## 2024-04-16 LAB — VITAMIN D 25 HYDROXY (VIT D DEFICIENCY, FRACTURES): VITD: 47.73 ng/mL (ref 30.00–100.00)

## 2024-04-16 LAB — TSH: TSH: 1.94 u[IU]/mL (ref 0.35–5.50)

## 2024-04-16 LAB — HEMOGLOBIN A1C: Hgb A1c MFr Bld: 5.5 % (ref 4.6–6.5)

## 2024-04-16 LAB — VITAMIN B12: Vitamin B-12: 639 pg/mL (ref 211–911)

## 2024-04-16 MED ORDER — POTASSIUM CHLORIDE CRYS ER 10 MEQ PO TBCR: 10.0000 meq | EXTENDED_RELEASE_TABLET | Freq: Two times a day (BID) | ORAL | 1 refills | Status: AC

## 2024-04-16 MED ORDER — PANTOPRAZOLE SODIUM 40 MG PO TBEC: 40.0000 mg | DELAYED_RELEASE_TABLET | Freq: Every day | ORAL | 3 refills | Status: AC

## 2024-04-16 NOTE — Progress Notes (Signed)
 " Phone: [phone]   Subjective:  Patient 64 y.o. male presenting for annual physical.  Chief Complaint  Patient presents with   Annual Exam    Pt is here for CPE    Discussed the use of AI scribe software for clinical note transcription with the patient, who gave verbal consent to proceed.  History of Present Illness Joshua Li is a 64 year old male who presents for an annual physical exam.  He is on Eliquis  5 mg twice daily, flecainide  50 mg twice daily, and metoprolol  25 mg daily for atrial fibrillation, which is well-controlled with infrequent episodes. He also takes rosuvastatin  20 mg daily for cholesterol and potassium twice daily.  He has a history of ulcerative colitis and has undergone a colectomy. He experiences typical post-colectomy symptoms, including nocturnal bowel movements. A colonoscopy in December 2024 revealed a polyp that was not removed due to Eliquis  use. He experiences minimal bleeding occasionally and takes iron every other day for anemia, likely due to absorption issues.  He takes pantoprazole  40 mg daily for reflux, which is well-controlled. 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  He experiences left shoulder pain, which he attributes to physical activity such as gutter cleaning, and reports stretching his neck and shoulders. He has mild vertigo when turning his head quickly, similar to an episode six years ago, possibly related to congestion or benign positional vertigo.  He experiences dry skin, especially on his legs during winter, and uses heavy-duty moisturizers. He has a slight rash and has used hydrocortisone cream in the past.  He has a history of prediabetes and reports trying to exercise more.   Annual physical-He acknowledges not exercising as much as he should but is trying to stay active. No major headaches, dizziness, syncope, chest pain, leg swelling, cough, wheezing, dyspnea, vomiting, urinary issues, or  other muscle and joint pains, except for lower back pain, which has improved with prednisone .    See problem oriented charting- ROS- ROS: Gen: no fever, chills  Skin: no rash, itching ENT: no ear pain, ear drainage, nasal congestion, rhinorrhea, sinus pressure, sore throat Eyes: no blurry vision, double vision Resp: no cough, wheeze,SOB CV: no CP, palpitations, LE edema,  GI: no heartburn, n/v/d/c, abd pain GU: no dysuria, urgency, frequency, hematuria MSK: chronic Neuro: no headache, weakness, vertigo Psych: no depression, anxiety, insomnia, SI   The following were reviewed and entered/updated in epic: Past Medical History:  Diagnosis Date   A-fib (HCC)    Actinic keratoses    Allergy  2000   seasonal allergies   Asthma    GERD (gastroesophageal reflux disease) 2000   Heart murmur    Hiatal hernia    Hyperlipidemia 2000   Stroke (HCC) 2024   TIA   TIA (transient ischemic attack)    Ulcerative colitis 2001   Status post total colectomy with J-pouch in 2003 -    Patient Active Problem List   Diagnosis Date Noted   Prediabetes 01/13/2023   Acute CVA (cerebrovascular accident) (HCC) 05/31/2022   Ulcerative colitis (HCC) 03/03/2022   Chronic mycotic otitis externa 09/09/2021   Neck mass 04/05/2021   Acute diffuse otitis externa of right ear 02/04/2021   Sinus bradycardia 10/26/2020   Renal insufficiency 04/13/2020   Blood pressure elevated without history of HTN 04/13/2020   Hyperglycemia 04/01/2020   Vitamin D  deficiency 04/01/2020   Paroxysmal atrial fibrillation (HCC) 05/04/2017   HLD (hyperlipidemia)    Hiatal hernia  GERD (gastroesophageal reflux disease)    Non-sustained ventricular tachycardia (HCC) - 5-20 beat runs 02/27/2017   PAT (paroxysmal atrial tachycardia) (HCC) - vs. >? Atrial Flutter 02/27/2017   Heart palpitations 05/13/2016   Asthma, chronic 07/04/2015   Acute bronchitis 07/04/2015   Acute frontal sinusitis 03/25/2015   Encounter for well  adult exam with abnormal findings 12/23/2014   Allergic rhinitis 06/25/2010   Actinic keratosis 06/25/2010   CONTACT DERMATITIS&OTHER ECZEMA DUE UNSPEC CAUSE 09/07/2009   Cough variant asthma 06/10/2009   Cough 06/10/2009   Near syncope 05/19/2009   PALPITATIONS, OCCASIONAL 05/19/2009   DERMATITIS, ATOPIC 10/04/2007   Asthma 01/22/2007   GERD 01/22/2007   CROHN'S DISEASE, LARGE AND SMALL INTESTINES 01/22/2007   GASTROINTESTINAL HEMORRHAGE, HX OF 01/22/2007   Past Surgical History:  Procedure Laterality Date   CHOLECYSTECTOMY  ???   pt not sure   COLECTOMY  2003   with one step take-down to J-pouch   COLECTOMY     COLECTOMY  2003   total colectomy with J pouch (one step procedure)   COLECTOMY  2003   total colectomy with j pouch ( one step)   TONSILLECTOMY     TOTAL COLECTOMY  2003   total colectomy with J pouch     Family History  Problem Relation Age of Onset   Cancer Mother        breast and ovarian   Heart attack Father 22   Stomach cancer Father 95       stomach & bladder -cause of death   Heart disease Father    Lymphoma Brother 11       Currently 65   Cancer Brother    Colon cancer Neg Hx    Esophageal cancer Neg Hx    Pancreatic cancer Neg Hx    Rectal cancer Neg Hx    Colon polyps Neg Hx     Medications- reviewed and updated Current Outpatient Medications  Medication Sig Dispense Refill   albuterol  (VENTOLIN  HFA) 108 (90 Base) MCG/ACT inhaler INHALE 2 PUFFS INTO THE LUNGS EVERY 6 (SIX) HOURS AS NEEDED FOR WHEEZING OR SHORTNESS OF BREATH. 24 g 3   cetirizine (ZYRTEC) 10 MG tablet Take 10 mg by mouth daily.     Cholecalciferol (VITAMIN D ) 50 MCG (2000 UT) CAPS Take 2,000 Units by mouth daily.     ELIQUIS  5 MG TABS tablet TAKE 1 TABLET BY MOUTH TWICE A DAY 60 tablet 5   flecainide  (TAMBOCOR ) 50 MG tablet TAKE 1 TABLET BY MOUTH TWICE A DAY 180 tablet 0   metoprolol  succinate (TOPROL -XL) 25 MG 24 hr tablet TAKE 1 TABLET (25 MG TOTAL) BY MOUTH DAILY. 90  tablet 0   rosuvastatin  (CRESTOR ) 20 MG tablet TAKE 1 TABLET BY MOUTH EVERY DAY 90 tablet 3   pantoprazole  (PROTONIX ) 40 MG tablet Take 1 tablet (40 mg total) by mouth daily. 90 tablet 3   potassium chloride  (KLOR-CON  M) 10 MEQ tablet Take 1 tablet (10 mEq total) by mouth 2 (two) times daily. 180 tablet 1   No current facility-administered medications for this visit.    Allergies-reviewed and updated Allergies[1]  Social History   Social History Narrative   Fun: Chiropodist   Denies religious beliefs effecting health care.    Marketing/cust service   Objective  Objective:  BP 120/68 (BP Location: Left Arm, Patient Position: Sitting, Cuff Size: Large)   Pulse 74   Temp (!) 97.2 F (36.2 C) (Temporal)   Ht 6' 3 (1.905  m)   Wt 230 lb 2 oz (104.4 kg)   SpO2 98%   BMI 28.76 kg/m  Physical Exam  Gen: WDWN NAD HEENT: NCAT, conjunctiva not injected, sclera nonicteric TM WNL B, OP moist, no exudates  NECK:  supple, no thyromegaly, no nodes, no carotid bruits CARDIAC: RRR, S1S2+, no murmur. DP 2+B LUNGS: CTAB. No wheezes ABDOMEN:  BS+, soft, NTND, No HSM, no masses EXT:  no edema MSK: no gross abnormalities. MS 5/5 all 4 NEURO: A&O x3.  CN II-XII intact.  PSYCH: normal mood. Good eye contact   Dry skin    Assessment and Plan   Health Maintenance counseling: 1. Anticipatory guidance: Patient counseled regarding regular dental exams q6 months, eye exams yearly, avoiding smoking and second hand smoke, limiting alcohol to 2 beverages per day.   2. Risk factor reduction:  Advised patient of need for regular exercise and diet rich in fruits and vegetables to reduce risk of heart attack and stroke. Exercise- encouraged.   Wt Readings from Last 3 Encounters:  04/16/24 230 lb 2 oz (104.4 kg)  10/30/23 223 lb 2 oz (101.2 kg)  10/13/23 231 lb (104.8 kg)   3. Immunizations/screenings/ancillary studies Immunization History  Administered Date(s) Administered   Fluzone Influenza  virus vaccine,trivalent (IIV3), split virus 03/03/2004, 04/23/2008   Influenza Split 12/26/2013   Influenza Whole 01/22/2007, 03/09/2009   Influenza, Seasonal, Injecte, Preservative Fre 12/12/2023   Influenza,inj,Quad PF,6+ Mos 12/24/2015, 01/23/2017, 12/26/2019, 01/08/2022   Influenza-Unspecified 12/27/2014, 01/13/2018, 12/06/2020, 01/12/2023   Moderna Covid-19 Fall Seasonal Vaccine 78yrs & older 10/13/2022, 12/12/2023   Moderna Sars-Covid-2 Vaccination 06/07/2019, 07/05/2019   Novel Infuenza-h1n1-09 04/23/2008   PFIZER(Purple Top)SARS-COV-2 Vaccination 01/25/2020, 08/13/2020, 12/10/2020   PNEUMOCOCCAL CONJUGATE-20 04/14/2023   Pfizer(Comirnaty)Fall Seasonal Vaccine 12 years and older 10/14/2021   Tdap 06/29/2011, 04/11/2022   Zoster Recombinant(Shingrix) 08/13/2020, 12/06/2020   Health Maintenance Due  Topic Date Due   Medicare Annual Wellness (AWV)  Never done    4. Prostate cancer screening >55yo - risk factors?  Lab Results  Component Value Date   PSA 0.91 04/11/2022   PSA 1.04 04/05/2021   PSA 0.83 04/01/2020    5. Colon cancer screening:due 2027 6. Skin cancer screening- Iadvised regular sunscreen use. Denies worrisome, changing, or new skin lesions.  7. Smoking associated screening (lung cancer screening, AAA screen 65-75, UA)- non smoker-  Wellness examination  PAT (paroxysmal atrial tachycardia) (HCC) - vs. >? Atrial Flutter -     TSH -     Comprehensive metabolic panel with GFR  Gastroesophageal reflux disease without esophagitis -     Comprehensive metabolic panel with GFR -     Vitamin B12 -     Pantoprazole  Sodium; Take 1 tablet (40 mg total) by mouth daily.  Dispense: 90 tablet; Refill: 3  Crohn's disease of small intestine without complications (HCC) -     TSH -     Comprehensive metabolic panel with GFR -     CBC with Differential/Platelet -     IBC + Ferritin -     Vitamin B12 -     VITAMIN D  25 Hydroxy (Vit-D Deficiency, Fractures)  Paroxysmal  atrial fibrillation (HCC)  Ulcerative pancolitis without complication (HCC) -     CBC with Differential/Platelet -     IBC + Ferritin -     Vitamin B12 -     VITAMIN D  25 Hydroxy (Vit-D Deficiency, Fractures)  Pure hypercholesterolemia -     Lipid panel -  TSH -     Comprehensive metabolic panel with GFR  Prediabetes -     TSH -     Comprehensive metabolic panel with GFR -     Hemoglobin A1c  Vitamin D  deficiency -     VITAMIN D  25 Hydroxy (Vit-D Deficiency, Fractures)  High risk medication use  Nocturia -     PSA  Low serum potassium -     Potassium Chloride  Crys ER; Take 1 tablet (10 mEq total) by mouth 2 (two) times daily.  Dispense: 180 tablet; Refill: 1    Assessment and Plan Assessment & Plan Annual physical examination and preventive care   He underwent a routine annual physical examination with no new surgeries or significant changes in family history. Immunizations, including tetanus and pneumonia, are up to date. His colonoscopy is current, with a follow-up recommended every three years. PSA screening was discussed and agreed upon. Routine blood work was performed, and PSA levels were checked. A follow-up is scheduled in six months.  Paroxysmal atrial fibrillation   His condition is well-controlled with rare episodes, managed with Eliquis , flecainide , and metoprolol . Continue Eliquis  5 mg twice daily, flecainide  50 mg twice daily, and metoprolol  25 mg daily. Managed by Card  Ulcerative colitis status post colectomy   He is status post colectomy with standard post-surgical symptoms and no significant diarrhea or constipation. The last colonoscopy in December 2024 showed one polyp not removed because of occasional minimal bleeding. A follow-up colonoscopy is recommended every three years. Continue current management and schedule a follow-up colonoscopy in three years. Check labs  Crohn's disease   Diagnosed post-colectomy, initially suspected to be ulcerative  colitis, with no current symptoms reported. Continue current management.  Gastroesophageal reflux disease   His condition is well-controlled with pantoprazole , with no recent breakthrough symptoms. Continue pantoprazole  40 mg daily and refill the prescription.  Pure hypercholesterolemia   Managed with rosuvastatin . Continue rosuvastatin  20 mg daily.  Prediabetes   Emphasis on diet and exercise for management. Encourage diet and exercise modifications.  Hypokalemia   Managed with potassium supplementation. Continue potassium supplementation and refill the prescription.  Nocturia   Experiences occ nocturia, check psat.  Eczema   Experiences dry skin and mild eczema, particularly in winter. Uses heavy-duty moisturizers and hydrocortisone cream for flare-ups. Continue using heavy-duty moisturizers and apply hydrocortisone cream to affected areas as needed.     Recommended follow up: Return in about 6 months (around 10/14/2024) for chronic follow-up.  Lab/Order associations:+ fasting   Jenkins CHRISTELLA Carrel, MD    [1] No Known Allergies  "

## 2024-04-16 NOTE — Progress Notes (Signed)
 Labs great/same doses except LDL cholesterol should ideally be <70-it was 79 today(but fine when last checked).  Did he miss doses at all?  We can continue as is and reck(may have been from poor eating during holidays???), or add zetia 10mg  daily #90/1 to regimen if doing well on diet and no good reason for worsening

## 2024-04-16 NOTE — Patient Instructions (Signed)
 It was very nice to see you today!  Hydrocortisone cream to bad spots   PLEASE NOTE:  If you had any lab tests please let us  know if you have not heard back within a few days. You may see your results on MyChart before we have a chance to review them but we will give you a call once they are reviewed by us . If we ordered any referrals today, please let us  know if you have not heard from their office within the next week.   Please try these tips to maintain a healthy lifestyle:  Eat most of your calories during the day when you are active. Eliminate processed foods including packaged sweets (pies, cakes, cookies), reduce intake of potatoes, white bread, white pasta, and white rice. Look for whole grain options, oat flour or almond flour.  Each meal should contain half fruits/vegetables, one quarter protein, and one quarter carbs (no bigger than a computer mouse).  Cut down on sweet beverages. This includes juice, soda, and sweet tea. Also watch fruit intake, though this is a healthier sweet option, it still contains natural sugar! Limit to 3 servings daily.  Drink at least 1 glass of water with each meal and aim for at least 8 glasses per day  Exercise at least 150 minutes every week.

## 2024-04-17 NOTE — Progress Notes (Signed)
 Called pt and notified he is going to hold off on additional medicine

## 2024-07-17 ENCOUNTER — Ambulatory Visit: Admitting: Dermatology

## 2024-10-14 ENCOUNTER — Ambulatory Visit: Admitting: Family Medicine
